# Patient Record
Sex: Female | Born: 1980 | Race: Black or African American | Hispanic: No | Marital: Single | Smoking: Never smoker
Health system: Southern US, Community
[De-identification: ages and names within clinical notes are randomized; demographics above are authoritative.]

## PROBLEM LIST (undated history)

## (undated) ENCOUNTER — Inpatient Hospital Stay (HOSPITAL_COMMUNITY): Payer: Self-pay

## (undated) DIAGNOSIS — I10 Essential (primary) hypertension: Secondary | ICD-10-CM

## (undated) DIAGNOSIS — I82409 Acute embolism and thrombosis of unspecified deep veins of unspecified lower extremity: Secondary | ICD-10-CM

## (undated) DIAGNOSIS — K219 Gastro-esophageal reflux disease without esophagitis: Secondary | ICD-10-CM

## (undated) DIAGNOSIS — D219 Benign neoplasm of connective and other soft tissue, unspecified: Secondary | ICD-10-CM

## (undated) DIAGNOSIS — I2699 Other pulmonary embolism without acute cor pulmonale: Secondary | ICD-10-CM

## (undated) DIAGNOSIS — D649 Anemia, unspecified: Secondary | ICD-10-CM

## (undated) HISTORY — DX: Other pulmonary embolism without acute cor pulmonale: I26.99

## (undated) HISTORY — PX: TONSILLECTOMY: SUR1361

## (undated) HISTORY — DX: Acute embolism and thrombosis of unspecified deep veins of unspecified lower extremity: I82.409

## (undated) HISTORY — DX: Essential (primary) hypertension: I10

---

## 2008-02-29 HISTORY — PX: LAPAROSCOPIC CHOLECYSTECTOMY: SUR755

## 2009-11-03 ENCOUNTER — Encounter: Admission: RE | Admit: 2009-11-03 | Discharge: 2009-11-03 | Payer: Self-pay | Admitting: Obstetrics

## 2010-06-10 ENCOUNTER — Emergency Department (HOSPITAL_COMMUNITY): Admission: EM | Admit: 2010-06-10 | Payer: Self-pay | Source: Home / Self Care

## 2010-11-09 ENCOUNTER — Emergency Department (HOSPITAL_COMMUNITY)
Admission: EM | Admit: 2010-11-09 | Discharge: 2010-11-09 | Disposition: A | Discharge status: Home / Self Care | Payer: Self-pay | Attending: Emergency Medicine | Admitting: Emergency Medicine

## 2010-11-09 ENCOUNTER — Emergency Department (HOSPITAL_COMMUNITY): Payer: Self-pay

## 2010-11-09 DIAGNOSIS — R059 Cough, unspecified: Secondary | ICD-10-CM | POA: Insufficient documentation

## 2010-11-09 DIAGNOSIS — R51 Headache: Secondary | ICD-10-CM | POA: Insufficient documentation

## 2010-11-09 DIAGNOSIS — J40 Bronchitis, not specified as acute or chronic: Secondary | ICD-10-CM | POA: Insufficient documentation

## 2010-11-09 DIAGNOSIS — R0989 Other specified symptoms and signs involving the circulatory and respiratory systems: Secondary | ICD-10-CM | POA: Insufficient documentation

## 2010-11-09 DIAGNOSIS — R05 Cough: Secondary | ICD-10-CM | POA: Insufficient documentation

## 2010-11-09 DIAGNOSIS — R0609 Other forms of dyspnea: Secondary | ICD-10-CM | POA: Insufficient documentation

## 2010-11-09 DIAGNOSIS — R0789 Other chest pain: Secondary | ICD-10-CM | POA: Insufficient documentation

## 2012-02-25 ENCOUNTER — Emergency Department (HOSPITAL_COMMUNITY)
Admission: EM | Admit: 2012-02-25 | Discharge: 2012-02-25 | Disposition: A | Discharge status: Home / Self Care | Payer: Medicaid Other | Attending: Emergency Medicine | Admitting: Emergency Medicine

## 2012-02-25 ENCOUNTER — Emergency Department (HOSPITAL_COMMUNITY): Payer: Medicaid Other

## 2012-02-25 ENCOUNTER — Encounter (HOSPITAL_COMMUNITY): Payer: Self-pay | Admitting: *Deleted

## 2012-02-25 DIAGNOSIS — R5381 Other malaise: Secondary | ICD-10-CM | POA: Insufficient documentation

## 2012-02-25 DIAGNOSIS — J020 Streptococcal pharyngitis: Secondary | ICD-10-CM | POA: Insufficient documentation

## 2012-02-25 DIAGNOSIS — R0602 Shortness of breath: Secondary | ICD-10-CM | POA: Insufficient documentation

## 2012-02-25 DIAGNOSIS — I10 Essential (primary) hypertension: Secondary | ICD-10-CM | POA: Insufficient documentation

## 2012-02-25 LAB — TROPONIN I: Troponin I: <0.3 ng/mL (ref <0.30)

## 2012-02-25 LAB — CBC
MCV: 65 fL — ABNORMAL LOW (ref 78.0–100.0)
Platelets: 245 10*3/uL (ref 150–400)
RBC: 4.94 MIL/uL (ref 3.87–5.11)
RDW: 14.1 % (ref 11.5–15.5)
WBC: 10.1 10*3/uL (ref 4.0–10.5)

## 2012-02-25 MED ORDER — FERROUS SULFATE 325 (65 FE) MG PO TABS: 325.0000 mg | ORAL_TABLET | Freq: Every day | ORAL | Status: DC

## 2012-02-25 MED ORDER — AMOXICILLIN 500 MG PO CAPS: 500.0000 mg | ORAL_CAPSULE | Freq: Three times a day (TID) | ORAL | Status: DC

## 2012-02-25 NOTE — ED Notes (Signed)
Multiple complaints. Reports having sore throat with possible fever last night. Airway intact at triage. Having generalized fatigue and sob with exertion. Had iud removed on 12/19 which was followed by heavy menstrual cycle with large blood clots. No acute distress noted at this time.

## 2012-02-25 NOTE — ED Notes (Signed)
Patient C/o sore throat today.  C/O fatigue. States that her children are being treated for strep throat.  States that she had her IUD removed and has had some bleeding and passing clots.

## 2012-02-25 NOTE — ED Provider Notes (Signed)
History     CSN: [id number]  Arrival date & time 02/25/12  4098   First MD Initiated Contact with Patient 02/25/12 2020      Chief Complaint  Patient presents with  . Sore Throat  . Fatigue    (Consider location/radiation/quality/duration/timing/severity/associated sxs/prior treatment) Patient is a 31 y.o. female presenting with pharyngitis and shortness of breath. The history is provided by the patient. No language interpreter was used.  Sore Throat This is a new problem. The current episode started in the past 7 days. The problem occurs intermittently. The problem has been gradually worsening. Associated symptoms include fatigue, a sore throat, swollen glands and weakness. Pertinent negatives include no rash. The symptoms are aggravated by swallowing. She has tried nothing for the symptoms.  Shortness of Breath  The current episode started more than 1 week ago. The problem occurs occasionally. The problem is mild. The symptoms are relieved by rest. The symptoms are aggravated by activity. Associated symptoms include sore throat and shortness of breath. She was not exposed to toxic fumes. She has not inhaled smoke recently. She has had no prior hospitalizations. She has had no prior ICU admissions. She has had no prior intubations. There were sick contacts at home.  Her two children are currently being treated for strep.  Patient reports heavy vaginal bleeding after removal of IUD on 12/19.  Shortness of breath symptoms started shortly thereafter.  Denies chest pain, reports occasional "tightness" in chest with worsening shortness of breath with exertion.  Past Medical History  Diagnosis Date  . Hypertension     History reviewed. No pertinent past surgical history.  History reviewed. No pertinent family history.  History  Substance Use Topics  . Smoking status: Not on file  . Smokeless tobacco: Not on file  . Alcohol Use: No     Comment: occ    OB History    Grav Para  Term Preterm Abortions TAB SAB Ect Mult Living                  Review of Systems  Constitutional: Positive for fatigue.  HENT: Positive for sore throat.   Respiratory: Positive for shortness of breath.   Skin: Negative for rash.  Neurological: Positive for weakness.  All other systems reviewed and are negative.    Allergies  Review of patient's allergies indicates no known allergies.  Home Medications  No current outpatient prescriptions on file.  BP 148/88  Pulse 103  Temp 98.3 F (36.8 C) (Oral)  Resp 16  SpO2 100%  LMP 02/18/2012  Physical Exam  Vitals reviewed. Constitutional: She is oriented to person, place, and time. She appears well-developed and well-nourished.  HENT:  Head: Normocephalic.  Mouth/Throat: Oropharyngeal exudate present.    Eyes: Conjunctivae normal are normal. Pupils are equal, round, and reactive to light.  Neck: Normal range of motion.  Cardiovascular: Normal rate and regular rhythm.   Pulmonary/Chest: Effort normal and breath sounds normal.  Abdominal: Soft. Bowel sounds are normal.  Musculoskeletal: Normal range of motion.  Lymphadenopathy:    She has cervical adenopathy.  Neurological: She is alert and oriented to person, place, and time.  Skin: Skin is warm and dry. No rash noted. No erythema.  Psychiatric: She has a normal mood and affect. Her behavior is normal. Judgment and thought content normal.    ED Course  Procedures (including critical care time)    Labs Reviewed  CBC - Abnormal; Notable for the following:    Hemoglobin 10.6 (*)  HCT 32.1 (*)     MCV 65.0 (*)     MCH 21.5 (*)     All other components within normal limits  RAPID STREP SCREEN - Abnormal; Notable for the following:    Streptococcus, Group A Screen (Direct) POSITIVE (*)     All other components within normal limits  TROPONIN I   No results found.  Date: 02/25/2012  Rate: 95  Rhythm: normal sinus rhythm  QRS Axis: normal  Intervals: normal   ST/T Wave abnormalities: nonspecific T wave changes  Conduction Disutrbances:none  Narrative Interpretation:  NSR  Old EKG Reviewed: none available   No diagnosis found.  Discussed patient with Dr. Ranae Palms.  CXR normal.  Troponin normal.  EKG without ischemic changes.  Suspect dyspnea may be due to mild anemia secondary to heavy vaginal bleeding.  Patient reports that bleeding has started to resolve over the last 24 hours.  Patient to follow-up with Dr. Gaynell Face if bleeding persists.  Amoxicillin initiated for strep.  Patient to follow-up with Dr. Concepcion Elk.  Return precautions discussed with patient for persistent dyspnea or development of chest pain.  MDM          Jimmye Norman, NP 02/25/12 4259  Jimmye Norman, NP 02/25/12 [phone]

## 2012-03-15 NOTE — ED Provider Notes (Signed)
Medical screening examination/treatment/procedure(s) were performed by non-physician practitioner and as supervising physician I was immediately available for consultation/collaboration.   Shailee Foots, MD 03/15/12 0830 

## 2012-05-29 ENCOUNTER — Emergency Department (HOSPITAL_COMMUNITY)
Admission: EM | Admit: 2012-05-29 | Discharge: 2012-05-29 | Disposition: A | Discharge status: Home / Self Care | Payer: Medicaid Other | Attending: Emergency Medicine | Admitting: Emergency Medicine

## 2012-05-29 ENCOUNTER — Encounter (HOSPITAL_COMMUNITY): Payer: Self-pay

## 2012-05-29 DIAGNOSIS — M25511 Pain in right shoulder: Secondary | ICD-10-CM

## 2012-05-29 DIAGNOSIS — Y92410 Unspecified street and highway as the place of occurrence of the external cause: Secondary | ICD-10-CM | POA: Insufficient documentation

## 2012-05-29 DIAGNOSIS — Y9389 Activity, other specified: Secondary | ICD-10-CM | POA: Insufficient documentation

## 2012-05-29 DIAGNOSIS — IMO0002 Reserved for concepts with insufficient information to code with codable children: Secondary | ICD-10-CM | POA: Insufficient documentation

## 2012-05-29 DIAGNOSIS — L729 Follicular cyst of the skin and subcutaneous tissue, unspecified: Secondary | ICD-10-CM

## 2012-05-29 DIAGNOSIS — I10 Essential (primary) hypertension: Secondary | ICD-10-CM | POA: Insufficient documentation

## 2012-05-29 DIAGNOSIS — L988 Other specified disorders of the skin and subcutaneous tissue: Secondary | ICD-10-CM | POA: Insufficient documentation

## 2012-05-29 DIAGNOSIS — Z4800 Encounter for change or removal of nonsurgical wound dressing: Secondary | ICD-10-CM | POA: Insufficient documentation

## 2012-05-29 MED ORDER — METHOCARBAMOL 500 MG PO TABS: 500.0000 mg | ORAL_TABLET | Freq: Two times a day (BID) | ORAL | Status: DC

## 2012-05-29 MED ORDER — HYDROCODONE-ACETAMINOPHEN 5-325 MG PO TABS: 1.0000 | ORAL_TABLET | ORAL | Status: DC | PRN

## 2012-05-29 MED ORDER — DOXYCYCLINE HYCLATE 100 MG PO CAPS: 100.0000 mg | ORAL_CAPSULE | Freq: Two times a day (BID) | ORAL | Status: DC

## 2012-05-29 MED ORDER — FLUCONAZOLE 150 MG PO TABS: 150.0000 mg | ORAL_TABLET | Freq: Once | ORAL | Status: DC

## 2012-05-29 MED ORDER — IBUPROFEN 800 MG PO TABS: 800.0000 mg | ORAL_TABLET | Freq: Three times a day (TID) | ORAL | Status: DC
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
## 2012-05-31 ENCOUNTER — Emergency Department (HOSPITAL_COMMUNITY)
Admission: EM | Admit: 2012-05-31 | Discharge: 2012-05-31 | Disposition: A | Discharge status: Home / Self Care | Payer: Medicaid Other | Attending: Emergency Medicine | Admitting: Emergency Medicine

## 2012-05-31 ENCOUNTER — Encounter (HOSPITAL_COMMUNITY): Payer: Self-pay

## 2012-05-31 DIAGNOSIS — Y929 Unspecified place or not applicable: Secondary | ICD-10-CM | POA: Insufficient documentation

## 2012-05-31 DIAGNOSIS — S139XXA Sprain of joints and ligaments of unspecified parts of neck, initial encounter: Secondary | ICD-10-CM | POA: Insufficient documentation

## 2012-05-31 DIAGNOSIS — R51 Headache: Secondary | ICD-10-CM | POA: Insufficient documentation

## 2012-05-31 DIAGNOSIS — Z79899 Other long term (current) drug therapy: Secondary | ICD-10-CM | POA: Insufficient documentation

## 2012-05-31 DIAGNOSIS — I10 Essential (primary) hypertension: Secondary | ICD-10-CM | POA: Insufficient documentation

## 2012-05-31 DIAGNOSIS — IMO0002 Reserved for concepts with insufficient information to code with codable children: Secondary | ICD-10-CM | POA: Insufficient documentation

## 2012-05-31 DIAGNOSIS — H539 Unspecified visual disturbance: Secondary | ICD-10-CM | POA: Insufficient documentation

## 2012-05-31 DIAGNOSIS — Y939 Activity, unspecified: Secondary | ICD-10-CM | POA: Insufficient documentation

## 2012-05-31 MED ORDER — IBUPROFEN 800 MG PO TABS
800.0000 mg | ORAL_TABLET | Freq: Once | ORAL | Status: AC
  Administered 2012-05-31: 800 mg via ORAL
  Filled 2012-05-31: qty 1

## 2012-05-31 MED ORDER — NOREPINEPHRINE BITARTRATE 1 MG/ML IJ SOLN
2.0000 ug/min | Freq: Once | INTRAVENOUS | Status: DC
  Filled 2012-05-31: qty 4

## 2012-05-31 NOTE — ED Provider Notes (Signed)
Medical screening examination/treatment/procedure(s) were performed by non-physician practitioner and as supervising physician I was immediately available for consultation/collaboration.  Nizhoni Parlow T Emalyn Schou, MD 05/31/12 1143 

## 2012-05-31 NOTE — ED Notes (Signed)
Pt states that she has HTN and takes HTZ and took around 0700 today. "I have seen a  Few spots here and there but nothing blurry to where I cant see." She is stating she has light sensitivity. Pt has a HA at the forehead and temple. Denies numbness and tingling anywhere.
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
## 2012-06-01 NOTE — ED Provider Notes (Signed)
Medical screening examination/treatment/procedure(s) were performed by non-physician practitioner and as supervising physician I was immediately available for consultation/collaboration.  Nishawn Rotan, MD 06/01/12 0700 

## 2012-08-11 ENCOUNTER — Encounter (HOSPITAL_COMMUNITY): Payer: Self-pay | Admitting: *Deleted

## 2012-08-11 DIAGNOSIS — M549 Dorsalgia, unspecified: Secondary | ICD-10-CM | POA: Insufficient documentation

## 2012-08-11 DIAGNOSIS — I10 Essential (primary) hypertension: Secondary | ICD-10-CM | POA: Insufficient documentation

## 2012-08-11 DIAGNOSIS — Z3201 Encounter for pregnancy test, result positive: Secondary | ICD-10-CM | POA: Insufficient documentation

## 2012-08-11 DIAGNOSIS — R112 Nausea with vomiting, unspecified: Secondary | ICD-10-CM | POA: Insufficient documentation

## 2012-08-11 DIAGNOSIS — R197 Diarrhea, unspecified: Secondary | ICD-10-CM | POA: Insufficient documentation

## 2012-08-11 DIAGNOSIS — Z331 Pregnant state, incidental: Secondary | ICD-10-CM | POA: Insufficient documentation

## 2012-08-11 LAB — URINALYSIS, ROUTINE W REFLEX MICROSCOPIC
Bilirubin Urine: NEGATIVE
Glucose, UA: NEGATIVE mg/dL
Hgb urine dipstick: NEGATIVE
Protein, ur: NEGATIVE mg/dL

## 2012-08-11 LAB — CBC WITH DIFFERENTIAL/PLATELET
Basophils Absolute: 0.1 10*3/uL (ref 0.0–0.1)
Eosinophils Relative: 2 % (ref 0–5)
Lymphocytes Relative: 43 % (ref 12–46)
MCV: 64.7 fL — ABNORMAL LOW (ref 78.0–100.0)
Monocytes Relative: 9 % (ref 3–12)
Neutrophils Relative %: 45 % (ref 43–77)
Platelets: 273 10*3/uL (ref 150–400)
RBC: 5.15 MIL/uL — ABNORMAL HIGH (ref 3.87–5.11)
RDW: 14.8 % (ref 11.5–15.5)
WBC: 8.8 10*3/uL (ref 4.0–10.5)

## 2012-08-11 LAB — COMPREHENSIVE METABOLIC PANEL
ALT: 15 U/L (ref 0–35)
AST: 18 U/L (ref 0–37)
Albumin: 4 g/dL (ref 3.5–5.2)
Alkaline Phosphatase: 60 U/L (ref 39–117)
CO2: 26 mEq/L (ref 19–32)
Chloride: 102 mEq/L (ref 96–112)
Creatinine, Ser: 0.88 mg/dL (ref 0.50–1.10)
GFR calc non Af Amer: 85 mL/min — ABNORMAL LOW (ref >90)
Potassium: 3.2 mEq/L — ABNORMAL LOW (ref 3.5–5.1)
Sodium: 136 mEq/L (ref 135–145)
Total Bilirubin: 0.3 mg/dL (ref 0.3–1.2)

## 2012-08-11 LAB — URINE MICROSCOPIC-ADD ON

## 2012-08-11 LAB — POCT PREGNANCY, URINE: Preg Test, Ur: POSITIVE — AB

## 2012-08-11 MED ORDER — ONDANSETRON 4 MG PO TBDP: 8.0000 mg | ORAL_TABLET | Freq: Once | ORAL | Status: DC

## 2012-08-11 NOTE — ED Notes (Signed)
Upper neck and radiating down to lower back, and now has resolved. N/v/d that started this morning and resolved ~1330.

## 2012-08-12 ENCOUNTER — Emergency Department (HOSPITAL_COMMUNITY)
Admission: EM | Admit: 2012-08-12 | Discharge: 2012-08-12 | Disposition: A | Discharge status: Home / Self Care | Payer: Medicaid Other | Attending: Emergency Medicine | Admitting: Emergency Medicine

## 2012-08-12 DIAGNOSIS — R110 Nausea: Secondary | ICD-10-CM

## 2012-08-12 DIAGNOSIS — Z331 Pregnant state, incidental: Secondary | ICD-10-CM

## 2012-08-12 DIAGNOSIS — M549 Dorsalgia, unspecified: Secondary | ICD-10-CM

## 2012-08-12 DIAGNOSIS — R197 Diarrhea, unspecified: Secondary | ICD-10-CM

## 2012-08-12 MED ORDER — ONDANSETRON 8 MG PO TBDP: 8.0000 mg | ORAL_TABLET | Freq: Once | ORAL | Status: DC

## 2012-08-12 NOTE — ED Provider Notes (Signed)
History     CSN: [id number]  Arrival date & time 08/11/12  2055   First MD Initiated Contact with Patient 08/12/12 0035      Chief Complaint  Patient presents with  . Back Pain  . Emesis    (Consider location/radiation/quality/duration/timing/severity/associated sxs/prior treatment) HPI 32 yo female presents to the ER with several complaints.  She reports upon waking yesterday she had diffuse pain to her entire back.  She denies any new activities or injury to the back.  Pain ran up and down her spine, worse with movement.  This resolved over time, without intervention.  Today she has had nausea without vomiting, and has had several loose stools/diarrhea.  Some abdominal cramping.  No fevers.  No sick contacts, no unusual foods, no travel.  Nausea and diarrhea have now resolved as well.  Pt currently asymptomatic, but wanted to get checked out to make sure nothing serious was going on.  Past Medical History  Diagnosis Date  . Hypertension     Past Surgical History  Procedure Laterality Date  . Cholecystectomy      No family history on file.  History  Substance Use Topics  . Smoking status: Never Smoker   . Smokeless tobacco: Not on file  . Alcohol Use: Yes     Comment: occ    OB History   Grav Para Term Preterm Abortions TAB SAB Ect Mult Living                  Review of Systems  All other systems reviewed and are negative.    Allergies  Review of patient's allergies indicates no known allergies.  Home Medications   Current Outpatient Rx  Name  Route  Sig  Dispense  Refill  . hydrochlorothiazide (HYDRODIURIL) 25 MG tablet   Oral   Take 25 mg by mouth daily.         . ondansetron (ZOFRAN-ODT) 8 MG disintegrating tablet   Oral   Take 1 tablet (8 mg total) by mouth once.   20 tablet   0     BP 136/84  Pulse 78  Temp(Src) 98.7 F (37.1 C) (Oral)  Resp 17  SpO2 99%  LMP 07/01/2012  Physical Exam  Nursing note and vitals  reviewed. Constitutional: She is oriented to person, place, and time. She appears well-developed and well-nourished.  HENT:  Head: Normocephalic and atraumatic.  Right Ear: External ear normal.  Left Ear: External ear normal.  Nose: Nose normal.  Mouth/Throat: Oropharynx is clear and moist.  Eyes: Conjunctivae and EOM are normal. Pupils are equal, round, and reactive to light.  Neck: Normal range of motion. Neck supple. No JVD present. No tracheal deviation present. No thyromegaly present.  Cardiovascular: Normal rate, regular rhythm, normal heart sounds and intact distal pulses.  Exam reveals no gallop and no friction rub.   No murmur heard. Pulmonary/Chest: Effort normal and breath sounds normal. No stridor. No respiratory distress. She has no wheezes. She has no rales. She exhibits no tenderness.  Abdominal: Soft. Bowel sounds are normal. She exhibits no distension and no mass. There is no tenderness. There is no rebound and no guarding.  Musculoskeletal: Normal range of motion. She exhibits no edema and no tenderness.  Lymphadenopathy:    She has no cervical adenopathy.  Neurological: She is alert and oriented to person, place, and time. She exhibits normal muscle tone. Coordination normal.  Skin: Skin is dry. No rash noted. No erythema. No pallor.  Psychiatric:  She has a normal mood and affect. Her behavior is normal. Judgment and thought content normal.    ED Course  Procedures (including critical care time)  Labs Reviewed  CBC WITH DIFFERENTIAL - Abnormal; Notable for the following:    RBC 5.15 (*)    Hemoglobin 11.2 (*)    HCT 33.3 (*)    MCV 64.7 (*)    MCH 21.7 (*)    All other components within normal limits  COMPREHENSIVE METABOLIC PANEL - Abnormal; Notable for the following:    Potassium 3.2 (*)    Calcium 10.8 (*)    GFR calc non Af Amer 85 (*)    All other components within normal limits  URINALYSIS, ROUTINE W REFLEX MICROSCOPIC - Abnormal; Notable for the  following:    Ketones, ur 15 (*)    Leukocytes, UA SMALL (*)    All other components within normal limits  URINE MICROSCOPIC-ADD ON - Abnormal; Notable for the following:    Squamous Epithelial / LPF MANY (*)    Bacteria, UA MANY (*)    Casts HYALINE CASTS (*)    All other components within normal limits  POCT PREGNANCY, URINE - Abnormal; Notable for the following:    Preg Test, Ur POSITIVE (*)    All other components within normal limits   No results found.   1. Diarrhea   2. Nausea   3. Pregnancy as incidental finding   4. Back pain       MDM  32 year old female with one day of back pain, now resolved, one day of nausea and diarrhea, also, now resolved.  Patient was found to be pregnant incidentally.  Her workup otherwise unremarkable.  She does have a Dr. that she can followup with.        Olivia Mackie, MD 08/13/12 [phone]

## 2012-08-23 ENCOUNTER — Encounter: Payer: Self-pay | Admitting: Obstetrics & Gynecology

## 2012-09-05 LAB — OB RESULTS CONSOLE ABO/RH: RH Type: POSITIVE

## 2012-09-20 ENCOUNTER — Other Ambulatory Visit (HOSPITAL_COMMUNITY): Payer: Self-pay | Admitting: Obstetrics

## 2012-09-20 DIAGNOSIS — O3680X0 Pregnancy with inconclusive fetal viability, not applicable or unspecified: Secondary | ICD-10-CM

## 2012-09-25 ENCOUNTER — Ambulatory Visit (HOSPITAL_COMMUNITY)
Admission: RE | Admit: 2012-09-25 | Discharge: 2012-09-25 | Disposition: A | Discharge status: Home / Self Care | Payer: Medicaid Other | Source: Ambulatory Visit | Attending: Obstetrics | Admitting: Obstetrics

## 2012-09-25 DIAGNOSIS — O368390 Maternal care for abnormalities of the fetal heart rate or rhythm, unspecified trimester, not applicable or unspecified: Secondary | ICD-10-CM | POA: Insufficient documentation

## 2012-09-25 DIAGNOSIS — O3680X0 Pregnancy with inconclusive fetal viability, not applicable or unspecified: Secondary | ICD-10-CM | POA: Insufficient documentation

## 2012-10-17 ENCOUNTER — Ambulatory Visit (HOSPITAL_COMMUNITY)
Admission: RE | Admit: 2012-10-17 | Discharge: 2012-10-17 | Disposition: A | Discharge status: Home / Self Care | Payer: Medicaid Other | Source: Ambulatory Visit | Attending: Obstetrics | Admitting: Obstetrics

## 2012-10-17 ENCOUNTER — Other Ambulatory Visit (HOSPITAL_COMMUNITY): Payer: Self-pay | Admitting: Obstetrics

## 2012-10-17 DIAGNOSIS — O3680X0 Pregnancy with inconclusive fetal viability, not applicable or unspecified: Secondary | ICD-10-CM | POA: Insufficient documentation

## 2012-10-17 DIAGNOSIS — IMO0002 Reserved for concepts with insufficient information to code with codable children: Secondary | ICD-10-CM

## 2012-10-17 DIAGNOSIS — O368390 Maternal care for abnormalities of the fetal heart rate or rhythm, unspecified trimester, not applicable or unspecified: Secondary | ICD-10-CM | POA: Insufficient documentation

## 2012-10-24 ENCOUNTER — Ambulatory Visit (INDEPENDENT_AMBULATORY_CARE_PROVIDER_SITE_OTHER): Payer: Medicaid Other | Admitting: Surgery

## 2012-10-24 ENCOUNTER — Encounter (INDEPENDENT_AMBULATORY_CARE_PROVIDER_SITE_OTHER): Payer: Self-pay | Admitting: Surgery

## 2012-10-24 VITALS — BP 126/80 | HR 78 | Temp 97.1°F | Resp 16 | Ht 64.0 in | Wt 197.2 lb

## 2012-10-24 DIAGNOSIS — R1900 Intra-abdominal and pelvic swelling, mass and lump, unspecified site: Secondary | ICD-10-CM

## 2012-10-24 DIAGNOSIS — E669 Obesity, unspecified: Secondary | ICD-10-CM

## 2012-10-24 NOTE — Patient Instructions (Addendum)
Obtain MRI of pelvis to define what this mass is.  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  Call us once to have that done.  At some point, this will require resection.  This likely will need to be at a major academic center where abdominal and perineal/buttock approaches need to occur.  Sometimes part of the sacrum (tailbone) requires resection to get to this mass.  Ideally, would wait until after pregnancy to address this.  I would like to avoid aspiration as that could risk the cystic for infection and therefore peritonitis and therefore loss of pregnancy.

## 2012-10-24 NOTE — Progress Notes (Signed)
Subjective:     Patient ID: Brittney Sandoval, female   DOB: 1980/08/02, 32 y.o.   MRN: [id number]  HPI  CARROL HOUGLAND  Sep 15, 1980 [id number]  Patient Care Team: Altamese Eufaula, MD as PCP - General (Family Medicine) Kathreen Cosier, MD as Consulting Physician (Obstetrics and Gynecology)  This patient is a 32 y.o.female who presents today for surgical evaluation at the request of Dr. Gaynell Face.   Reason for visit: Perirectal cyst.  Request drainage  Pleasant obese female that is struggled with a cyst on her buttocks.  It is intermittently swollen.  Usually with pregnancies.  All been vaginal deliveries.    Usually has not aspirated with a needle.  Never had it excised.  It is more swollen now on her third pregnancy.  She is currently in her first trimester.  His the largest it has ever been.  He is getting uncomfortable to sit on.  No fevers or chills.  Does have constipation with bowel movements 2-3 times a week.  No rectal pain.  No vaginal bleeding or discharge.  No dysuria or hematuria.  She had a laparoscopic cholecystectomy but no other abdominal surgeries.  Patient Active Problem List   Diagnosis Date Noted  . 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 Obesity (BMI 30-39.9) 10/24/2012    Past Medical History  Diagnosis Date  . Hypertension     Past Surgical History  Procedure Laterality Date  . Cholecystectomy      History   Social History  . Marital Status: Single    Spouse Name: N/A    Number of Children: N/A  . Years of Education: N/A   Occupational History  . Not on file.   Social History Main Topics  . Smoking status: Never Smoker   . Smokeless tobacco: Not on file  . Alcohol Use: Yes     Comment: occ  . Drug Use: No  . Sexual Activity: Yes    Birth Control/ Protection: None   Other Topics Concern  . Not on file   Social History Narrative  . No narrative on file    History reviewed.  No pertinent family history.  Current Outpatient Prescriptions  Medication Sig Dispense Refill  . Prenatal Vit-Fe Sulfate-FA (PRENATAL VITAMIN PO) Take by mouth.       No current facility-administered medications for this visit.     No Known Allergies  BP 126/80  Pulse 78  Temp(Src) 97.1 F (36.2 C) (Temporal)  Resp 16  Ht 5\' 4"  (1.626 m)  Wt 197 lb 3.2 oz (89.449 kg)  BMI 33.83 kg/m2   OBSTETRICS REPORT (Signed Final 10/17/2012 04:22 pm) ---------------------------------------------------------------------- Patient Info  ID #: [id number] D.O.B.: [birth year] (31 yrs) Name: Brittney Sandoval Visit Date: 10/17/2012 04:00 pm ---------------------------------------------------------------------- Performed By  Performed By: Earley Brooke Referred By: Francoise Ceo MD BS, RDMS Attending: Myles Rosenthal MD Location: [facility] ---------------------------------------------------------------------- Service(s) Provided  US OB LIMITED 76815.0 ---------------------------------------------------------------------- Indications  Absent fetal heart tones in office 659.70 Pregnancy with inconclusive fetal viability V23.87 ---------------------------------------------------------------------- Fetal Evaluation  Num Of Fetuses: 1 Fetal Heart Rate: 153 bpm Cardiac Activity: Observed Presentation: Transverse, head to maternal right Placenta: Anterior, above cervical os  Amniotic Fluid AFI FV: Subjectively within normal limits Larg Pckt: 2.9 cm ---------------------------------------------------------------------- Biometry  CRL: 90 mm G. Age: N/A EDD:  BPD: 23.4 mm G. Age: 13w 6d ---------------------------------------------------------------------- Gestational Age  LMP: 15w 3d Date: 07/01/12 EDD: 04/07/13 U/S Today: 13w  6d EDD: 04/18/13 Best: 14w 5d Det. By: U/S C R L (09/25/12) EDD:  04/12/13 ---------------------------------------------------------------------- Cervix Uterus Adnexa  Cervix: Normal appearance by transabdominal scan. Uterus: Single fibroid noted, see table below. Left Ovary: Size(cm) L: 3.96 x W: 3.16 x H: 1.92 Volume(cc): 12.6 Right Ovary: Size(cm) L: 3.14 x W: 2.73 x H: 2.24 Volume(cc): 10.1 Adnexa: No abnormality visualized. Comment: Cystic mass again in cul de sac, which has tubular shape and complex fluid. This measures 17.5 x 11.4 x 8.4cm today. ---------------------------------------------------------------------- Myomas  Site L(cm) W(cm) D(cm) Location Posterior 2.6 2.6 2.8 ----------------------------------------------------------------------  Blood Flow RI PI Comments  ---------------------------------------------------------------------- Impression  Single living intrauterine fetus. Small posterior fibroid. Large tubular cystic lesion in cul de sac measuring 17.5 cm, with features highly suggestive of hydrosalpins or hematosalpinx, or possibly a peritoneal inclusion cyst. Consider pelvic MRI for further characterization. ----------------------------------------------------------------------  Thank you for sharing in the care of Ms. Ledora A Feick with [place]. Please do not hesitate to contact us if you have any questions or concerns. Myles Rosenthal, MD Electronically Signed Final Report 10/17/2012 04:22 pm   Embedded Images (not for diagnostic purposes)     Signs and Symptoms:    History:  FETAL HEART TONES NOT HEARD  Comments:    Visit Pt Loc:  [facility] Phone:     Performing Provider Comments:     Phone Number: Beeper: EMail: " noWrap align=leftAttending:  Phone Number: Beeper: EMail: " align=leftMARSHALL, BERNARD A   Phone:    Beeper:    Email:     Phone Number: Beeper: EMail: " noWrap align=leftRequester:  Phone Number: Beeper: EMail: " align=leftMARSHALL, BERNARD A      Review of Systems  Constitutional:  Negative for fever, chills, diaphoresis, appetite change and fatigue.  HENT: Negative for ear pain, sore throat, trouble swallowing, neck pain and ear discharge.   Eyes: Negative for photophobia, discharge and visual disturbance.  Respiratory: Negative for cough, choking, chest tightness and shortness of breath.   Cardiovascular: Negative for chest pain and palpitations.  Gastrointestinal: Positive for constipation. Negative for nausea, vomiting, abdominal pain, diarrhea, anal bleeding and rectal pain.  Endocrine: Negative for cold intolerance and heat intolerance.  Genitourinary: Positive for pelvic pain. Negative for dysuria, urgency, frequency, hematuria, decreased urine volume, vaginal bleeding, vaginal discharge, enuresis, difficulty urinating and vaginal pain.  Musculoskeletal: Negative for myalgias and gait problem.  Skin: Negative for color change, pallor and rash.  Allergic/Immunologic: Negative for environmental allergies, food allergies and immunocompromised state.  Neurological: Negative for dizziness, speech difficulty, weakness and numbness.  Hematological: Negative for adenopathy.  Psychiatric/Behavioral: Negative for confusion and agitation. The patient is not nervous/anxious.        Objective:   Physical Exam  Constitutional: She is oriented to person, place, and time. She appears well-developed and well-nourished. No distress.  HENT:  Head: Normocephalic.  Mouth/Throat: Oropharynx is clear and moist. No oropharyngeal exudate.  Eyes: Conjunctivae and EOM are normal. Pupils are equal, round, and reactive to light. No scleral icterus.  Neck: Normal range of motion. Neck supple. No tracheal deviation present.  Cardiovascular: Normal rate, regular rhythm and intact distal pulses.   Pulmonary/Chest: Effort normal and breath sounds normal. No stridor. No respiratory distress. She exhibits no tenderness.  Abdominal: Soft. She exhibits no distension and no mass. There is no  tenderness. No hernia. Hernia confirmed negative in the right inguinal area and confirmed negative in the left inguinal area.    Genitourinary:    No vaginal discharge found.  Musculoskeletal: Normal range of motion. She exhibits no tenderness.       Right elbow: She exhibits normal range of motion.       Left elbow: She exhibits normal range of motion.       Right wrist: She exhibits normal range of motion.       Left wrist: She exhibits normal range of motion.       Right hand: Normal strength noted.       Left hand: Normal strength noted.  Lymphadenopathy:       Head (right side): No posterior auricular adenopathy present.       Head (left side): No posterior auricular adenopathy present.    She has no cervical adenopathy.    She has no axillary adenopathy.       Right: No inguinal adenopathy present.       Left: No inguinal adenopathy present.  Neurological: She is alert and oriented to person, place, and time. No cranial nerve deficit. She exhibits normal muscle tone. Coordination normal.  Skin: Skin is warm and dry. No rash noted. She is not diaphoretic. No erythema.  Psychiatric: She has a normal mood and affect. Her behavior is normal. Judgment and thought content normal.       Assessment:     Large ellipsoid mass in right buttock.  Ultrasound implies contiguous with large pelvic fluid mass.  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  I strongly recommend avoiding this during her pregnancy if possible.  I discussed with my partner in colorectal surgery, Dr. Romie Levee.  Suspect this would require complex removal through transabdominal and perineal approach.  Possible partial resection of sacrum for transsacral approach.  Given how rare these are, would be best served at a major academic center in performing this.  Would like to get a pelvic MRI to characterize this mass to make sure there are no  surprises.   I do not like the idea of just doing needle aspiration either.  Back.  Risk of infection of the cyst.  Infected cyst could lead to peritonitis and fetal loss.  Also, the large size makes long-term resolution of this poor.  She was hoping to have it aspirated, but she agrees with getting more information first.  She is worried that the cystic mass will complicate the health of her pregnancy, Such as the length was hard to get fetal heart tones with the cystic mass interfering with office ultrasound/duplex.  Given the fact that FHT could be found in radiology will be ultrasound, it gives me hope it will be less complicated especially as the uterus gets larger and out of the pelvis.  I doubt it will threaten the pregnancy, but, but would like to get more information first and then regroup.  She will call as well as the MRI is done.  Hopefully that will be soon.

## 2012-10-31 ENCOUNTER — Encounter (HOSPITAL_COMMUNITY): Payer: Self-pay

## 2012-10-31 ENCOUNTER — Other Ambulatory Visit (INDEPENDENT_AMBULATORY_CARE_PROVIDER_SITE_OTHER): Payer: Self-pay | Admitting: Surgery

## 2012-10-31 ENCOUNTER — Ambulatory Visit (HOSPITAL_COMMUNITY)
Admission: RE | Admit: 2012-10-31 | Discharge: 2012-10-31 | Disposition: A | Discharge status: Home / Self Care | Payer: Medicaid Other | Source: Ambulatory Visit | Attending: Surgery | Admitting: Surgery

## 2012-10-31 DIAGNOSIS — O99891 Other specified diseases and conditions complicating pregnancy: Secondary | ICD-10-CM | POA: Insufficient documentation

## 2012-10-31 DIAGNOSIS — R1900 Intra-abdominal and pelvic swelling, mass and lump, unspecified site: Secondary | ICD-10-CM

## 2012-10-31 DIAGNOSIS — R1909 Other intra-abdominal and pelvic swelling, mass and lump: Secondary | ICD-10-CM | POA: Insufficient documentation

## 2012-11-01 ENCOUNTER — Telehealth (INDEPENDENT_AMBULATORY_CARE_PROVIDER_SITE_OTHER): Payer: Self-pay | Admitting: Surgery

## 2012-11-01 NOTE — Telephone Encounter (Signed)
Requested by the patient's insurance company for preauthorization on request for a pelvic MRI. I discussed it with them 10/26/2012. Given the fact that she is in the first trimester of pregnancy and has a complex 17cm cystic mass in the pelvis coming out of the perineum, I felt it was justified. CT scan on option with her pregnancy. Also feel an MRI would be better to evaluate this soft tissue mass. Recommended by radiology as ultrasound limited on evaluating the entire region. I d/w the insurance MD whom agreed with the approriateness of the order. He noted that preauthorization should come through the fax later today.  [phone]  Ref # [id number].  Insurance pt ID# [id number]

## 2012-11-05 ENCOUNTER — Telehealth (INDEPENDENT_AMBULATORY_CARE_PROVIDER_SITE_OTHER): Payer: Self-pay

## 2012-11-05 NOTE — Telephone Encounter (Signed)
Pt returned my call. I advised her about the MRI report showing the giant cyst is starting at the hip joint coming up thru the pelvic floor and going thru her buttocks per Dr Michaell Cowing. Dr Michaell Cowing spoke to Dr Gaynell Face about recommendation of the pt being referred to a major academic center like [facility] and Dr Gaynell Face said ok. The pt advised me that she has switched her OB/GYN care to [facility] to Dr Osborn Coho. The pt requested we send her info to their office. I will fax her records today to [phone]. The pt is asking about just at least getting the area drained till she has the baby b/c of discomfort and I explained to her that Dr Michaell Cowing does not want to do anything with this cyst per Dr Michaell Cowing. Dr Michaell Cowing advises for pt to be seen at [facility] to get their recommendations. The pt understands.

## 2012-11-05 NOTE — Telephone Encounter (Signed)
LMOM for pt to call me back so I can discus her MRI results. Dr Michaell Cowing advised the MRI to show a giant cyst that starts at the hip bone coming up thru the pelvic floor and going out thru her buttocks. Dr Michaell Cowing spoke to Dr Gaynell Face her OB/GYN that the pt would benefit better with this mass requiring removal from a major academic center like [facility] to better serve the pt's care. Dr Gaynell Face took the recommendation.

## 2012-11-06 ENCOUNTER — Other Ambulatory Visit: Payer: Self-pay

## 2012-11-06 LAB — OB RESULTS CONSOLE RPR: RPR: NONREACTIVE

## 2012-11-08 ENCOUNTER — Encounter (HOSPITAL_COMMUNITY): Payer: Self-pay | Admitting: Obstetrics and Gynecology

## 2012-11-13 ENCOUNTER — Encounter (HOSPITAL_COMMUNITY): Payer: Self-pay | Admitting: Obstetrics and Gynecology

## 2012-11-14 ENCOUNTER — Encounter (INDEPENDENT_AMBULATORY_CARE_PROVIDER_SITE_OTHER): Payer: Self-pay

## 2012-11-15 ENCOUNTER — Other Ambulatory Visit: Payer: Self-pay | Admitting: Obstetrics and Gynecology

## 2012-11-15 DIAGNOSIS — Z3689 Encounter for other specified antenatal screening: Secondary | ICD-10-CM

## 2012-11-15 DIAGNOSIS — L0591 Pilonidal cyst without abscess: Secondary | ICD-10-CM

## 2012-11-20 ENCOUNTER — Encounter (HOSPITAL_COMMUNITY): Payer: Self-pay

## 2012-11-20 ENCOUNTER — Ambulatory Visit (HOSPITAL_COMMUNITY)
Admission: RE | Admit: 2012-11-20 | Discharge: 2012-11-20 | Disposition: A | Discharge status: Home / Self Care | Payer: Medicaid Other | Source: Ambulatory Visit | Attending: Obstetrics and Gynecology | Admitting: Obstetrics and Gynecology

## 2012-11-20 ENCOUNTER — Inpatient Hospital Stay (HOSPITAL_COMMUNITY): Admission: RE | Admit: 2012-11-20 | Payer: Medicaid Other | Source: Ambulatory Visit

## 2012-11-20 ENCOUNTER — Other Ambulatory Visit (HOSPITAL_COMMUNITY): Payer: Self-pay | Admitting: Maternal and Fetal Medicine

## 2012-11-20 DIAGNOSIS — R1900 Intra-abdominal and pelvic swelling, mass and lump, unspecified site: Secondary | ICD-10-CM

## 2012-11-20 DIAGNOSIS — O10019 Pre-existing essential hypertension complicating pregnancy, unspecified trimester: Secondary | ICD-10-CM | POA: Insufficient documentation

## 2012-11-20 DIAGNOSIS — Z363 Encounter for antenatal screening for malformations: Secondary | ICD-10-CM | POA: Insufficient documentation

## 2012-11-20 DIAGNOSIS — Z3689 Encounter for other specified antenatal screening: Secondary | ICD-10-CM

## 2012-11-20 DIAGNOSIS — L0591 Pilonidal cyst without abscess: Secondary | ICD-10-CM

## 2012-11-20 DIAGNOSIS — O358XX0 Maternal care for other (suspected) fetal abnormality and damage, not applicable or unspecified: Secondary | ICD-10-CM | POA: Insufficient documentation

## 2012-11-20 DIAGNOSIS — Z1389 Encounter for screening for other disorder: Secondary | ICD-10-CM | POA: Insufficient documentation

## 2012-11-20 DIAGNOSIS — O09299 Supervision of pregnancy with other poor reproductive or obstetric history, unspecified trimester: Secondary | ICD-10-CM | POA: Insufficient documentation

## 2012-11-20 NOTE — Consult Note (Signed)
Maternal Fetal Medicine Consultation  Requesting Provider(s): Osborn Coho, MD  Reason for consultation: Large pilonidal cyst  HPI: Brittney Sandoval is a 32 yo [id number] currently at [redacted]w[redacted]d who was seen today due to a large pilonidal cyst.  Brittney Sandoval had this issue though out the course of her last pregnancies, but the cyst has never been this large or uncomfortable.  She reports that with her last delivery, her Obstetrician "drained the cyst" shortly before labor was induced.  During her last pregnancy, the cyst was aspirated at least on one other occasion due to symptoms.  It has never been addressed outside of pregnancy.  Recent MRI reports: 19.6 x 9.7 x 8.2 cystic lesion that extends from the cul-de-sac through the left levator ani and into the subcutaneous tissues of the left buttock.  The patient was seen by General Surgery Karie Soda, MD) as well as a General surgeon at [facility] - both feel that surgery / exploration should be deferred until after delivery.  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  Additionally, due to risks of infection, they do not feel that needle aspiration is appropriate at this time as well.  The recommendation is that the procedure be performed at a large academic facility after delivery.  Brittney Sandoval reports a history of hypertension.  She was previously on HCTZ but has been off medications since May 2014 and blood pressures have been normal without medications.  OB History: OB History   Grav Para Term Preterm Abortions TAB SAB Ect Mult Living   4 3 3       3     Preeclampsia with first pregnancy  PMH:  Past Medical History  Diagnosis Date  . Hypertension     PSH:  Past Surgical History  Procedure Laterality Date  . [place]aroscopic [place]cyst[place]  [place]   Meds: Prenatal vitamins  Allergies: No  Known Allergies  FH: Denies family history of birth defects or hereditary disorders  Soc: Drank occasionally before pregnancy.  No tobacco or ETOH since becoming pregnant.  Review of Systems: no vaginal bleeding or cramping/contractions, no LOF, no nausea/vomiting. All other systems reviewed and are negative.  PE: Wt: 201#, BP 127/77 Pulse 82  GEN: well-appearing female ABD: gravid, NT  Ultrasound: Single IUP at 18 5/7 weeks Normal anatomic fetal survey; however, limited views of the fetal heart obtained due to fetal position No markers associated with aneuploidy noted Normal amniotic fluid volume  Large pelvic / rectal  cystic structure noted measuring 8.9 x 7.8 x 17.2 cm.  A/P: 1) Single IUP at 20 2/7 weeks         2) Large cystic pelvic / perineal mass - based on the description of the mass and the likelihood that it will require both transabdominal and perineal approach for resection would concur with decision to postpone definitive surgery until after delivery.  The recommendation to avoid needle aspiration due to risks of infection also seem reasonable.  The patient's concern that this large mass could potentially preclude vaginal delivery.  She addressed with issue with the General Surgeon at [facility], and they do not believe that this will be an issue as the mass is cystic and not solid.  They are not concerned that the cyst may rupture at delivery.  Based on this information, I would not recommend a planned cesarean delivery -  but would have a low threshold to move toward cesarean delivery for arrest of descent or other obstetrical indications.  - Recommend follow up ultrasound in 4 weeks to reevaluate the fetal heart.   Thank you for the opportunity to be a part of the care of Brittney Sandoval. Please contact our office if we can be of further assistance.   I spent approximately 30 minutes with this patient with over 50% of time spent in face-to-face  counseling.  Alpha Gula, MD Maternal Fetal Medicine

## 2012-11-27 ENCOUNTER — Encounter: Payer: Self-pay | Admitting: Gynecologic Oncology

## 2012-11-27 ENCOUNTER — Ambulatory Visit: Payer: Medicaid Other | Attending: Gynecologic Oncology | Admitting: Gynecologic Oncology

## 2012-11-27 VITALS — BP 133/79 | HR 86 | Temp 98.4°F | Resp 20 | Ht 64.0 in | Wt 204.4 lb

## 2012-11-27 DIAGNOSIS — R1909 Other intra-abdominal and pelvic swelling, mass and lump: Secondary | ICD-10-CM | POA: Insufficient documentation

## 2012-11-27 DIAGNOSIS — O99891 Other specified diseases and conditions complicating pregnancy: Secondary | ICD-10-CM | POA: Insufficient documentation

## 2012-11-27 DIAGNOSIS — R1900 Intra-abdominal and pelvic swelling, mass and lump, unspecified site: Secondary | ICD-10-CM

## 2012-11-28 ENCOUNTER — Encounter: Payer: Self-pay | Admitting: *Deleted

## 2012-11-29 ENCOUNTER — Encounter: Payer: Self-pay | Admitting: Obstetrics and Gynecology

## 2012-11-29 NOTE — Progress Notes (Signed)
Consult Note: Gyn-Onc  Consult was requested by Dr. Pennie Rushing for the evaluation of Brittney Sandoval 32 y.o. female  CC:  Chief Complaint  Patient presents with  . Pelvic Mass    New Consult    Assessment/Plan:  Brittney. Brittney Sandoval  is a 32 y.o.  year old with a 19 x 6 x 9.7 8.2 cm unilocular cystic lesion extending from the cul-de-sac adjacent to the performance muscle into the left perirectal space is rectal fossa are and within 3 mm of the skin surface of the subcutaneous tissue the differential diagnosis for this mass is that of a peritoneal inclusion cyst lymphangioma or tailgut duplication cyst. The patient denies changes in urinary flow or rectal function however the examination the vagina is displaced towards the right. I am not inclined to believe that a vaginal delivery if possible however resection of this mass during pregnancy may be associated with significant morbidity. The patient decided to change her obstetric care and has a risk pregnancy clinic visit scheduled for 12/03/2012 I. reviewed the images with a general surgeon at [facility] and he recommends referral to [facility] for management after pregnancy and does not believe that drainage of the mass is appropriate.  Have asked Brittney Sandoval to contact us following her high risk obstetric visit so that I can identify a tear or delivery plan and make recommendations for additional followup at [facility] as is indicated.   HPI: Brittney Sandoval is a 32 year old gravida 4 para 3 estimated date of confinement 04/12/2013. She noted the presence of a mass protruding through the left inferior perineum at the age of 69. The mass remains relatively stable in size and at the time of childbirth that was drained and she is able to have a normal spontaneous vaginal delivery. Resection was recommended however because of the dizziness of life function and able to followup. The mass recurred and at the time of her third pregnancy the age of 3 this was again  drained just prior to delivery in [place]. The mass has recurred and per the patient is larger than it's ever been.Her obstetrician for this pregnancy, Dr. Gaynell Face, was reluctant to drain assistant and amended assessment by Dr. gross. Dr. gross his assessment is that this is a pilonidal cyst and he recommends resection of the larger facility after pregnancy due to the complexity of the mass. The patient states that she saw a surgeon Dr. Emogene Morgan at [facility] recommended a vaginal delivery and treatment after pregnancy. Dr. Emogene Morgan is willing to drain the mass as is necessary.  She denies any weight loss any changes in appetite she states that there is normal fetal movement without rupture of membranes and no contractions.  She denies changes in the caliber of her stool denies urinary frequency.  She reports discomfort with sitting but otherwise is sexually active and denies that it causes pain.   MRI Unilocular 19.6 by 9.7 by 8.2 cm cystic lesion extends from the cul-de-sac posteriorly, adjacent to the piriformis muscle and into the left perirectal space, ischiorectal fossa, and subcutaneous tissues. This traverses the left levator ani muscle, with a significant posterior portion of the mass along the left medial margin of the intergluteal fold only 3 mm deep to the skin surface posterior to the anus. Suspected small intramural fibroids in the lower uterus. Cervical length 5.4 cm. The very bottom margin of the fetus is noted. The urinary bladder is positioned anteriorly and flattened. There is somewhat acute angulation of the  sacrococcygeal junction as shown on image 24 of series 8. This appears chronic. Distal tip of the coccyx is oriented anteriorly along the posterior margin of the fluid collection on image 24 of series 8. No bony erosion observed.  Top differential diagnostic considerations include peritoneal inclusion cyst, lymphangioma, or tailgut duplication cyst.  Current Meds:  Outpatient  Encounter Prescriptions as of 11/27/2012  Medication Sig Dispense Refill  . Prenatal Vit-Fe Sulfate-FA (PRENATAL VITAMIN PO) Take by mouth.       No facility-administered encounter medications on file as of 11/27/2012.    Allergy: No Known Allergies  Social Hx:   History   Social History  . Marital Status: Single    Spouse Name: N/A    Number of Children: N/A  . Years of Education: N/A   Occupational History  . Not on file.   Social History Main Topics  . Smoking status: Never Smoker   . Smokeless tobacco: Not on file  . Alcohol Use: Yes     Comment: occ  . Drug Use: No  . Sexual Activity: Yes    Birth Control/ Protection: None   Other Topics Concern  . Not on file   Social History Narrative   Lives in [place].  Relocated to [place] a few years ago.    Past Surgical Hx:  Past Surgical History  Procedure Laterality Date  . [place]ar[place]  [place]    Past Medical Hx:  Past Medical History  Diagnosis Date  . Hypertension     Past Gynecological History: G4 P3 estimated date of confinement 04/12/2012 intrauterine pregnancy 20 2/7  Patient's last menstrual period was 07/01/2012.  Family Hx: No family history on file.  Review of Systems:  Constitutional  Feels well, denies malaise good appetite Cardiovascular  No chest pain, shortness of breath, or edema  Pulmonary  No cough or wheeze.  Gastro Intestinal  No nausea, vomitting, or diarrhoea. No bright red blood per rectum, no abdominal pain, change in bowel movement, or constipation. Denies changes in the caliber of her stool Genito Urinary  No frequency, urgency, dysuria, reports fetal movement no rupture of membranes, no uterine contractions, sexually active without difficulty Musculo Skeletal  No myalgia, arthralgia, joint swelling or pain  Neurologic  No weakness, numbness, change in gait,  Psychology  No depression, anxiety, insomnia.   Vitals:   Blood pressure 133/79, pulse 86, temperature 98.4 F (36.9 C), temperature source Oral, resp. rate 20, height 5\' 4"  (1.626 m), weight 204 lb 6.4 oz (92.715 kg), last menstrual period 07/01/2012.  Physical Exam: BP 133/79  Pulse 86  Temp(Src) 98.4 F (36.9 C) (Oral)  Resp 20  Ht 5\' 4"  (1.626 m)  Wt 204 lb 6.4 oz (92.715 kg)  BMI 35.07 kg/m2  LMP 07/01/2012 WD in NAD Neck  Supple NROM, without any enlargements.  Lymph Node Survey No cervical supraclavicular or inguinal adenopathy Cardiovascular  Pulse normal rate, regularity and rhythm. S1 and S2 normal.  Lungs  Clear to auscultation bilateraly,  Good air movement.  Skin  No rash/lesions/breakdown  Psychiatry  Alert and oriented to person, place, and time appropriate manner and affect Abdomen  Normoactive bowel sounds, abdomen soft, non-tender.   Back No CVA tenderness Genito Urinary  Vulva/vagina: Normal external female genitalia.  No lesions. No discharge or bleeding.  Bladder/urethra:  No lesions or masses  Vagina: laterally displaced to the right.  Mass palpable lateral to the left vaginal sidewall. Appears to be in  the rectal fossa.    Cervix: Probably normal   Uterus: Date nontender no palpable contraction Adnexa: Unable to assess because of the presence of the mass however no nodularity was appreciated in the cul-de-sac. Rectal  Good tone, large mass palpable in the left pararectal fossa and visible externally measuring approximately 10 cm on the perineum Extremities  No bilateral cyanosis, clubbing or edema.   Laurette Schimke, MD, PhD 11/29/2012, 9:44 AM

## 2012-12-03 ENCOUNTER — Ambulatory Visit (INDEPENDENT_AMBULATORY_CARE_PROVIDER_SITE_OTHER): Payer: Medicaid Other | Admitting: Family Medicine

## 2012-12-03 ENCOUNTER — Encounter: Payer: Self-pay | Admitting: Family Medicine

## 2012-12-03 ENCOUNTER — Other Ambulatory Visit (HOSPITAL_COMMUNITY)
Admission: RE | Admit: 2012-12-03 | Discharge: 2012-12-03 | Disposition: A | Discharge status: Home / Self Care | Payer: Medicaid Other | Source: Ambulatory Visit | Attending: Family Medicine | Admitting: Family Medicine

## 2012-12-03 VITALS — BP 124/85 | Temp 97.2°F | Wt 206.3 lb

## 2012-12-03 DIAGNOSIS — Z01419 Encounter for gynecological examination (general) (routine) without abnormal findings: Secondary | ICD-10-CM | POA: Insufficient documentation

## 2012-12-03 DIAGNOSIS — Z113 Encounter for screening for infections with a predominantly sexual mode of transmission: Secondary | ICD-10-CM | POA: Insufficient documentation

## 2012-12-03 DIAGNOSIS — R1900 Intra-abdominal and pelvic swelling, mass and lump, unspecified site: Secondary | ICD-10-CM

## 2012-12-03 DIAGNOSIS — N760 Acute vaginitis: Secondary | ICD-10-CM | POA: Insufficient documentation

## 2012-12-03 DIAGNOSIS — O10019 Pre-existing essential hypertension complicating pregnancy, unspecified trimester: Secondary | ICD-10-CM

## 2012-12-03 DIAGNOSIS — D573 Sickle-cell trait: Secondary | ICD-10-CM

## 2012-12-03 DIAGNOSIS — O10012 Pre-existing essential hypertension complicating pregnancy, second trimester: Secondary | ICD-10-CM

## 2012-12-03 DIAGNOSIS — Z1151 Encounter for screening for human papillomavirus (HPV): Secondary | ICD-10-CM | POA: Insufficient documentation

## 2012-12-03 DIAGNOSIS — O0990 Supervision of high risk pregnancy, unspecified, unspecified trimester: Secondary | ICD-10-CM | POA: Insufficient documentation

## 2012-12-03 LAB — POCT URINALYSIS DIP (DEVICE)
Ketones, ur: NEGATIVE mg/dL
Leukocytes, UA: NEGATIVE
Protein, ur: NEGATIVE mg/dL
Urobilinogen, UA: 0.2 mg/dL (ref 0.0–1.0)

## 2012-12-03 MED ORDER — METRONIDAZOLE 500 MG PO TABS: 500.0000 mg | ORAL_TABLET | Freq: Two times a day (BID) | ORAL | Status: AC

## 2012-12-03 NOTE — Patient Instructions (Signed)
Preeclampsia and Eclampsia Preeclampsia is a condition of high blood pressure during pregnancy. It can happen at 20 weeks or later in pregnancy. If high blood pressure occurs in the second half of pregnancy with no other symptoms, it is called gestational hypertension and goes away after the baby is born. If any of the symptoms listed below develop with gestational hypertension, it is then called preeclampsia. Eclampsia (convulsions) may follow preeclampsia. This is one of the reasons for regular prenatal checkups. Early diagnosis and treatment are very important to prevent eclampsia. CAUSES  There is no known cause of preeclampsia/eclampsia in pregnancy. There are several known conditions that may put the pregnant woman at risk, such as:  The first pregnancy.  Having preeclampsia in a past pregnancy.  Having lasting (chronic) high blood pressure.  Having multiples (twins, triplets).  Being age 34 or older.  African American ethnic background.  Having kidney disease or diabetes.  Medical conditions such as lupus or blood diseases.  Being overweight (obese). SYMPTOMS   High blood pressure.  Headaches.  Sudden weight gain.  Swelling of hands, face, legs, and feet.  Protein in the urine.  Feeling sick to your stomach (nauseous) and throwing up (vomiting).  Vision problems (blurred or double vision).  Numbness in the face, arms, legs, and feet.  Dizziness.  Slurred speech.  Preeclampsia can cause growth retardation in the fetus.  Separation (abruption) of the placenta.  Not enough fluid in the amniotic sac (oligohydramnios).  Sensitivity to bright lights.  Belly (abdominal) pain. DIAGNOSIS  If protein is found in the urine in the second half of pregnancy, this is considered preeclampsia. Other symptoms mentioned above may also be present. TREATMENT  It is necessary to treat this.  Your caregiver may prescribe bed rest early in this condition. Plenty of rest and  salt restriction may be all that is needed.  Medicines may be necessary to lower blood pressure if the condition does not respond to more conservative measures.  In more severe cases, hospitalization may be needed:  For treatment of blood pressure.  To control fluid retention.  To monitor the baby to see if the condition is causing harm to the baby.  Hospitalization is the best way to treat the first sign of preeclampsia. This is so the mother and baby can be watched closely and blood tests can be done effectively and correctly.  If the condition becomes severe, it may be necessary to induce labor or to remove the infant by surgical means (cesarean section). The best cure for preeclampsia/eclampsia is to deliver the baby. Preeclampsia and eclampsia involve risks to mother and infant. Your caregiver will discuss these risks with you. Together, you can work out the best possible approach to your problems. Make sure you keep your prenatal visits as scheduled. Not keeping appointments could result in a chronic or permanent injury, pain, disability to you, and death or injury to you or your unborn baby. If there is any problem keeping the appointment, you must call to reschedule. HOME CARE INSTRUCTIONS   Keep your prenatal appointments and tests as scheduled.  Tell your caregiver if you have any of the above risk factors.  Get plenty of rest and sleep.  Eat a balanced diet that is low in salt, and do not add salt to your food.  Avoid stressful situations.  Only take over-the-counter and prescriptions medicines for pain, discomfort, or fever as directed by your caregiver. SEEK IMMEDIATE MEDICAL CARE IF:   You develop severe swelling  anywhere in the body. This usually occurs in the legs.  You gain 5 lb/2.3 kg or more in a week.  You develop a severe headache, dizziness, problems with your vision, or confusion.  You have abdominal pain, nausea, or vomiting.  You have a seizure.  You  have trouble moving any part of your body, or you develop numbness or problems speaking.  You have bruising or abnormal bleeding from anywhere in the body.  You develop a stiff neck.  You pass out. MAKE SURE YOU:   Understand these instructions.  Will watch your condition.  Will get help right away if you are not doing well or get worse. Document Released: 02/12/2000 Document Revised: 05/09/2011 Document Reviewed: 09/28/2007 [facility] Patient Information 2014 [place], [place].  Pregnancy - Second Trimester The second trimester of pregnancy (3 to 6 months) is a period of rapid growth for you and your baby. At the end of the sixth month, your baby is about 9 inches long and weighs 1 1/2 pounds. You will begin to feel the baby move between 18 and 20 weeks of the pregnancy. This is called quickening. Weight gain is faster. A clear fluid (colostrum) may leak out of your breasts. You may feel small contractions of the womb (uterus). This is known as false labor or Braxton-Hicks contractions. This is like a practice for labor when the baby is ready to be born. Usually, the problems with morning sickness have usually passed by the end of your first trimester. Some women develop small dark blotches (called cholasma, mask of pregnancy) on their face that usually goes away after the baby is born. Exposure to the sun makes the blotches worse. Acne may also develop in some pregnant women and pregnant women who have acne, may find that it goes away. PRENATAL EXAMS  Blood work may continue to be done during prenatal exams. These tests are done to check on your health and the probable health of your baby. Blood work is used to follow your blood levels (hemoglobin). Anemia (low hemoglobin) is common during pregnancy. Iron and vitamins are given to help prevent this. You will also be checked for diabetes between 24 and 28 weeks of the pregnancy. Some of the previous blood tests may be repeated.  The size of the  uterus is measured during each visit. This is to make sure that the baby is continuing to grow properly according to the dates of the pregnancy.  Your blood pressure is checked every prenatal visit. This is to make sure you are not getting toxemia.  Your urine is checked to make sure you do not have an infection, diabetes or protein in the urine.  Your weight is checked often to make sure gains are happening at the suggested rate. This is to ensure that both you and your baby are growing normally.  Sometimes, an ultrasound is performed to confirm the proper growth and development of the baby. This is a test which bounces harmless sound waves off the baby so your caregiver can more accurately determine due dates. Sometimes, a test is done on the amniotic fluid surrounding the baby. This test is called an amniocentesis. The amniotic fluid is obtained by sticking a needle into the belly (abdomen). This is done to check the chromosomes in instances where there is a concern about possible genetic problems with the baby. It is also sometimes done near the end of pregnancy if an early delivery is required. In this case, it is done to help  make sure the baby's lungs are mature enough for the baby to live outside of the womb. CHANGES OCCURING IN THE SECOND TRIMESTER OF PREGNANCY Your body goes through many changes during pregnancy. They vary from person to person. Talk to your caregiver about changes you notice that you are concerned about.  During the second trimester, you will likely have an increase in your appetite. It is normal to have cravings for certain foods. This varies from person to person and pregnancy to pregnancy.  Your lower abdomen will begin to bulge.  You may have to urinate more often because the uterus and baby are pressing on your bladder. It is also common to get more bladder infections during pregnancy. You can help this by drinking lots of fluids and emptying your bladder before and  after intercourse.  You may begin to get stretch marks on your hips, abdomen, and breasts. These are normal changes in the body during pregnancy. There are no exercises or medicines to take that prevent this change.  You may begin to develop swollen and bulging veins (varicose veins) in your legs. Wearing support hose, elevating your feet for 15 minutes, 3 to 4 times a day and limiting salt in your diet helps lessen the problem.  Heartburn may develop as the uterus grows and pushes up against the stomach. Antacids recommended by your caregiver helps with this problem. Also, eating smaller meals 4 to 5 times a day helps.  Constipation can be treated with a stool softener or adding bulk to your diet. Drinking lots of fluids, and eating vegetables, fruits, and whole grains are helpful.  Exercising is also helpful. If you have been very active up until your pregnancy, most of these activities can be continued during your pregnancy. If you have been less active, it is helpful to start an exercise program such as walking.  Hemorrhoids may develop at the end of the second trimester. Warm sitz baths and hemorrhoid cream recommended by your caregiver helps hemorrhoid problems.  Backaches may develop during this time of your pregnancy. Avoid heavy lifting, wear low heal shoes, and practice good posture to help with backache problems.  Some pregnant women develop tingling and numbness of their hand and fingers because of swelling and tightening of ligaments in the wrist (carpel tunnel syndrome). This goes away after the baby is born.  As your breasts enlarge, you may have to get a bigger bra. Get a comfortable, cotton, support bra. Do not get a nursing bra until the last month of the pregnancy if you will be nursing the baby.  You may get a dark line from your belly button to the pubic area called the linea nigra.  You may develop rosy cheeks because of increase blood flow to the face.  You may develop  spider looking lines of the face, neck, arms, and chest. These go away after the baby is born. HOME CARE INSTRUCTIONS   It is extremely important to avoid all smoking, herbs, alcohol, and unprescribed drugs during your pregnancy. These chemicals affect the formation and growth of the baby. Avoid these chemicals throughout the pregnancy to ensure the delivery of a healthy infant.  Most of your home care instructions are the same as suggested for the first trimester of your pregnancy. Keep your caregiver's appointments. Follow your caregiver's instructions regarding medicine use, exercise, and diet.  During pregnancy, you are providing food for you and your baby. Continue to eat regular, well-balanced meals. Choose foods such as meat, fish,  milk and other low fat dairy products, vegetables, fruits, and whole-grain breads and cereals. Your caregiver will tell you of the ideal weight gain.  A physical sexual relationship may be continued up until near the end of pregnancy if there are no other problems. Problems could include early (premature) leaking of amniotic fluid from the membranes, vaginal bleeding, abdominal pain, or other medical or pregnancy problems.  Exercise regularly if there are no restrictions. Check with your caregiver if you are unsure of the safety of some of your exercises. The greatest weight gain will occur in the last 2 trimesters of pregnancy. Exercise will help you:  Control your weight.  Get you in shape for labor and delivery.  Lose weight after you have the baby.  Wear a good support or jogging bra for breast tenderness during pregnancy. This may help if worn during sleep. Pads or tissues may be used in the bra if you are leaking colostrum.  Do not use hot tubs, steam rooms or saunas throughout the pregnancy.  Wear your seat belt at all times when driving. This protects you and your baby if you are in an accident.  Avoid raw meat, uncooked cheese, cat litter boxes,  and soil used by cats. These carry germs that can cause birth defects in the baby.  The second trimester is also a good time to visit your dentist for your dental health if this has not been done yet. Getting your teeth cleaned is okay. Use a soft toothbrush. Brush gently during pregnancy.  It is easier to leak urine during pregnancy. Tightening up and strengthening the pelvic muscles will help with this problem. Practice stopping your urination while you are going to the bathroom. These are the same muscles you need to strengthen. It is also the muscles you would use as if you were trying to stop from passing gas. You can practice tightening these muscles up 10 times a set and repeating this about 3 times per day. Once you know what muscles to tighten up, do not perform these exercises during urination. It is more likely to contribute to an infection by backing up the urine.  Ask for help if you have financial, counseling, or nutritional needs during pregnancy. Your caregiver will be able to offer counseling for these needs as well as refer you for other special needs.  Your skin may become oily. If so, wash your face with mild soap, use non-greasy moisturizer and oil or cream based makeup. MEDICINES AND DRUG USE IN PREGNANCY  Take prenatal vitamins as directed. The vitamin should contain 1 milligram of folic acid. Keep all vitamins out of reach of children. Only a couple vitamins or tablets containing iron may be fatal to a baby or young child when ingested.  Avoid use of all medicines, including herbs, over-the-counter medicines, not prescribed or suggested by your caregiver. Only take over-the-counter or prescription medicines for pain, discomfort, or fever as directed by your caregiver. Do not use aspirin.  Let your caregiver also know about herbs you may be using.  Alcohol is related to a number of birth defects. This includes fetal alcohol syndrome. All alcohol, in any form, should be avoided  completely. Smoking will cause low birth rate and premature babies.  Street or illegal drugs are very harmful to the baby. They are absolutely forbidden. A baby born to an addicted mother will be addicted at birth. The baby will go through the same withdrawal an adult does. SEEK MEDICAL CARE IF:  You have any concerns or worries during your pregnancy. It is better to call with your questions if you feel they cannot wait, rather than worry about them. SEEK IMMEDIATE MEDICAL CARE IF:   An unexplained oral temperature above 102 F (38.9 C) develops, or as your caregiver suggests.  You have leaking of fluid from the vagina (birth canal). If leaking membranes are suspected, take your temperature and tell your caregiver of this when you call.  There is vaginal spotting, bleeding, or passing clots. Tell your caregiver of the amount and how many pads are used. Light spotting in pregnancy is common, especially following intercourse.  You develop a bad smelling vaginal discharge with a change in the color from clear to white.  You continue to feel sick to your stomach (nauseated) and have no relief from remedies suggested. You vomit blood or coffee ground-like materials.  You lose more than 2 pounds of weight or gain more than 2 pounds of weight over 1 week, or as suggested by your caregiver.  You notice swelling of your face, hands, feet, or legs.  You get exposed to [place] measles and have never had them.  You are exposed to fifth disease or chickenpox.  You develop belly (abdominal) pain. Round ligament discomfort is a common non-cancerous (benign) cause of abdominal pain in pregnancy. Your caregiver still must evaluate you.  You develop a bad headache that does not go away.  You develop fever, diarrhea, pain with urination, or shortness of breath.  You develop visual problems, blurry, or double vision.  You fall or are in a car accident or any kind of trauma.  There is mental or  physical violence at home. Document Released: 02/08/2001 Document Revised: 11/09/2011 Document Reviewed: 08/13/2008 [facility] Patient Information 2014 [place], [place].  Breastfeeding A change in hormones during your pregnancy causes growth of your breast tissue and an increase in number and size of milk ducts. The hormone prolactin allows proteins, sugars, and fats from your blood supply to make breast milk in your milk-producing glands. The hormone progesterone prevents breast milk from being released before the birth of your baby. After the birth of your baby, your progesterone level decreases allowing breast milk to be released. Thoughts of your baby, as well as his or her sucking or crying, can stimulate the release of milk from the milk-producing glands. Deciding to breastfeed (nurse) is one of the best choices you can make for you and your baby. The information that follows gives a brief review of the benefits, as well as other important skills to know about breastfeeding. BENEFITS OF BREASTFEEDING For your baby  The first milk (colostrum) helps your baby's digestive system function better.   There are antibodies in your milk that help your baby fight off infections.   Your baby has a lower incidence of asthma, allergies, and sudden infant death syndrome (SIDS).   The nutrients in breast milk are better for your baby than infant formulas.  Breast milk improves your baby's brain development.   Your baby will have less gas, colic, and constipation.  Your baby is less likely to develop other conditions, such as childhood obesity, asthma, or diabetes mellitus. For you  Breastfeeding helps develop a very special bond between you and your baby.   Breastfeeding is convenient, always available at the correct temperature, and costs nothing.   Breastfeeding helps to burn calories and helps you lose the weight gained during pregnancy.   Breastfeeding makes your uterus contract back  down  to normal size faster and slows bleeding following delivery.   Breastfeeding mothers have a lower risk of developing osteoporosis or breast or ovarian cancer later in life.  BREASTFEEDING FREQUENCY  A healthy, full-term baby may breastfeed as often as every hour or space his or her feedings to every 3 hours. Breastfeeding frequency will vary from baby to baby.   Newborns should be fed no less than every 2 3 hours during the day and every 4 5 hours during the night. You should breastfeed a minimum of 8 feedings in a 24 hour period.  Awaken your baby to breastfeed if it has been 3 4 hours since the last feeding.  Breastfeed when you feel the need to reduce the fullness of your breasts or when your newborn shows signs of hunger. Signs that your baby may be hungry include:  Increased alertness or activity.  Stretching.  Movement of the head from side to side.  Movement of the head and opening of the mouth when the corner of the mouth or cheek is stroked (rooting).  Increased sucking sounds, smacking lips, cooing, sighing, or squeaking.  Hand-to-mouth movements.  Increased sucking of fingers or hands.  Fussing.  Intermittent crying.  Signs of extreme hunger will require calming and consoling before you try to feed your baby. Signs of extreme hunger may include:  Restlessness.  A loud, strong cry.  Screaming.  Frequent feeding will help you make more milk and will help prevent problems, such as sore nipples and engorgement of the breasts.  BREASTFEEDING   Whether lying down or sitting, be sure that the baby's abdomen is facing your abdomen.   Support your breast with 4 fingers under your breast and your thumb above your nipple. Make sure your fingers are well away from your nipple and your baby's mouth.   Stroke your baby's lips gently with your finger or nipple.   When your baby's mouth is open wide enough, place all of your nipple and as much of the colored  area around your nipple (areola) as possible into your baby's mouth.  More areola should be visible above his or her upper lip than below his or her lower lip.  Your baby's tongue should be between his or her lower gum and your breast.  Ensure that your baby's mouth is correctly positioned around the nipple (latched). Your baby's lips should create a seal on your breast.  Signs that your baby has effectively latched onto your nipple include:  Tugging or sucking without pain.  Swallowing heard between sucks.  Absent click or smacking sound.  Muscle movement above and in front of his or her ears with sucking.  Your baby must suck about 2 3 minutes in order to get your milk. Allow your baby to feed on each breast as long as he or she wants. Nurse your baby until he or she unlatches or falls asleep at the first breast, then offer the second breast.  Signs that your baby is full and satisfied include:  A gradual decrease in the number of sucks or complete cessation of sucking.  Falling asleep.  Extension or relaxation of his or her body.  Retention of a small amount of milk in his or her mouth.  Letting go of your breast by himself or herself.  Signs of effective breastfeeding in you include:  Breasts that have increased firmness, weight, and size prior to feeding.  Breasts that are softer after nursing.  Increased milk volume, as well as  a change in milk consistency and color by the 5th day of breastfeeding.  Breast fullness relieved by breastfeeding.  Nipples are not sore, cracked, or bleeding.  If needed, break the suction by putting your finger into the corner of your baby's mouth and sliding your finger between his or her gums. Then, remove your breast from his or her mouth.  It is common for babies to spit up a small amount after a feeding.  Babies often swallow air during feeding. This can make babies fussy. Burping your baby between breasts can help with  this.  Vitamin D supplements are recommended for babies who get only breast milk.  Avoid using a pacifier during your baby's first 4 6 weeks.  Avoid supplemental feedings of water, formula, or juice in place of breastfeeding. Breast milk is all the food your baby needs. It is not necessary for your baby to have water or formula. Your breasts will make more milk if supplemental feedings are avoided during the early weeks. HOW TO TELL WHETHER YOUR BABY IS GETTING ENOUGH BREAST MILK Wondering whether or not your baby is getting enough milk is a common concern among mothers. You can be assured that your baby is getting enough milk if:   Your baby is actively sucking and you hear swallowing.   Your baby seems relaxed and satisfied after a feeding.   Your baby nurses at least 8 12 times in a 24 hour time period.  During the first 35 52 days of age:  Your baby is wetting at least 3 5 diapers in a 24 hour period. The urine should be clear and pale yellow.  Your baby is having at least 3 4 stools in a 24 hour period. The stool should be soft and yellow.  At 72 10 days of age, your baby is having at least 3 6 stools in a 24 hour period. The stool should be seedy and yellow by 13 days of age.  Your baby has a weight loss less than 7 10% during the first 44 days of age.  Your baby does not lose weight after 36 22 days of age.  Your baby gains 4 7 ounces each week after he or she is 36 days of age.  Your baby gains weight by 15 days of age and is back to birth weight within 2 weeks. ENGORGEMENT In the first week after your baby is born, you may experience extremely full breasts (engorgement). When engorged, your breasts may feel heavy, warm, or tender to the touch. Engorgement peaks within 24 48 hours after delivery of your baby.  Engorgement may be reduced by:  Continuing to breastfeed.  Increasing the frequency of breastfeeding.  Taking warm showers or applying warm, moist heat to your breasts  just before each feeding. This increases circulation and helps the milk flow.   Gently massaging your breast before and during the feedings. With your fingertips, massage from your chest wall towards your nipple in a circular motion.   Ensuring that your baby empties at least one breast at every feeding. It also helps to start the next feeding on the opposite breast.   Expressing breast milk by hand or by using a breast pump to empty the breasts if your baby is sleepy, or not nursing well. You may also want to express milk if you are returning to work oryou feel you are getting engorged.  Ensuring your baby is latched on and positioned properly while breastfeeding. If you follow these  suggestions, your engorgement should improve in 24 48 hours. If you are still experiencing difficulty, call your lactation consultant or caregiver.  CARING FOR YOURSELF Take care of your breasts.  Bathe or shower daily.   Avoid using soap on your nipples.   Wear a supportive bra. Avoid wearing underwire style bras.  Air dry your nipples for a 3 after each feeding.   Use only cotton bra pads to absorb breast milk leakage. Leaking of breast milk between feedings is normal.   Use only pure lanolin on your nipples after nursing. You do not need to wash it off before feeding your baby again. Another option is to express a few drops of breast milk and gently massage that milk into your nipples.  Continue breast self-awareness checks. Take care of yourself.  Eat healthy foods. Alternate 3 meals with 3 snacks.  Avoid foods that you notice affect your baby in a bad way.  Drink milk, fruit juice, and water to satisfy your thirst (about 8 glasses a day).   Rest often, relax, and take your prenatal vitamins to prevent fatigue, stress, and anemia.  Avoid chewing and smoking tobacco.  Avoid alcohol and drug use.  Take over-the-counter and prescribed medicine only as directed by your  caregiver or pharmacist. You should always check with your caregiver or pharmacist before taking any new medicine, vitamin, or herbal supplement.  Know that pregnancy is possible while breastfeeding. If desired, talk to your caregiver about family planning and safe birth control methods that may be used while breastfeeding. SEEK MEDICAL CARE IF:   You feel like you want to stop breastfeeding or have become frustrated with breastfeeding.  You have painful breasts or nipples.  Your nipples are cracked or bleeding.  Your breasts are red, tender, or warm.  You have a swollen area on either breast.  You have a fever or chills.  You have nausea or vomiting.  You have drainage from your nipples.  Your breasts do not become full before feedings by the 5th day after delivery.  You feel sad and depressed.  Your baby is too sleepy to eat well.  Your baby is having trouble sleeping.   Your baby is wetting less than 3 diapers in a 24 hour period.  Your baby has less than 3 stools in a 24 hour period.  Your baby's skin or the white part of his or her eyes becomes more yellow.   Your baby is not gaining weight by 30 days of age. MAKE SURE YOU:   Understand these instructions.  Will watch your condition.  Will get help right away if you are not doing well or get worse. Document Released: 02/14/2005 Document Revised: 11/09/2011 Document Reviewed: 09/21/2011 [facility] Patient Information 2014 [place], [place].

## 2012-12-03 NOTE — Progress Notes (Signed)
P= 84 Pt. Has a cyst and reports having pain/pressure while sitting down. Pt. Would like to be checked for BV. Pt. Reports having sore throat and feeling fatigued and SOB with activity since Friday. Pt. Refused flu vaccine today.

## 2012-12-03 NOTE — Progress Notes (Signed)
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  It is cystic and was present in previous pregnancies.  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 Advised to complete surgery pp. Address mode of delivery as we get closer to dates. Will continue care with [facility] Gen. Surg. To manage cyst. Treat BV.

## 2012-12-04 ENCOUNTER — Telehealth: Payer: Self-pay | Admitting: Gynecologic Oncology

## 2012-12-04 LAB — WET PREP, GENITAL: Yeast Wet Prep HPF POC: NONE SEEN

## 2012-12-04 NOTE — Telephone Encounter (Signed)
Called patient to review the treatment plan delineated for delivery.  Message left.

## 2012-12-06 ENCOUNTER — Ambulatory Visit: Payer: Medicaid Other | Admitting: Gynecologic Oncology

## 2012-12-07 ENCOUNTER — Encounter: Payer: Self-pay | Admitting: *Deleted

## 2012-12-18 ENCOUNTER — Ambulatory Visit (HOSPITAL_COMMUNITY)
Admission: RE | Admit: 2012-12-18 | Discharge: 2012-12-18 | Disposition: A | Discharge status: Home / Self Care | Payer: Medicaid Other | Source: Ambulatory Visit | Attending: Obstetrics and Gynecology | Admitting: Obstetrics and Gynecology

## 2012-12-18 VITALS — BP 135/73 | HR 95 | Wt 214.5 lb

## 2012-12-18 DIAGNOSIS — O10019 Pre-existing essential hypertension complicating pregnancy, unspecified trimester: Secondary | ICD-10-CM | POA: Insufficient documentation

## 2012-12-18 DIAGNOSIS — Z3689 Encounter for other specified antenatal screening: Secondary | ICD-10-CM | POA: Insufficient documentation

## 2012-12-18 DIAGNOSIS — O09299 Supervision of pregnancy with other poor reproductive or obstetric history, unspecified trimester: Secondary | ICD-10-CM | POA: Insufficient documentation

## 2012-12-18 DIAGNOSIS — O0990 Supervision of high risk pregnancy, unspecified, unspecified trimester: Secondary | ICD-10-CM

## 2012-12-18 DIAGNOSIS — R1900 Intra-abdominal and pelvic swelling, mass and lump, unspecified site: Secondary | ICD-10-CM

## 2012-12-18 NOTE — Progress Notes (Signed)
Brittney Sandoval was seen for ultrasound appointment today.  Please see [facility] report for details.  

## 2012-12-24 ENCOUNTER — Ambulatory Visit (INDEPENDENT_AMBULATORY_CARE_PROVIDER_SITE_OTHER): Payer: Medicaid Other | Admitting: Obstetrics and Gynecology

## 2012-12-24 ENCOUNTER — Telehealth: Payer: Self-pay | Admitting: General Practice

## 2012-12-24 ENCOUNTER — Encounter: Payer: Self-pay | Admitting: Obstetrics and Gynecology

## 2012-12-24 VITALS — BP 128/82 | Temp 96.7°F | Wt 210.5 lb

## 2012-12-24 DIAGNOSIS — O10019 Pre-existing essential hypertension complicating pregnancy, unspecified trimester: Secondary | ICD-10-CM

## 2012-12-24 DIAGNOSIS — E669 Obesity, unspecified: Secondary | ICD-10-CM

## 2012-12-24 DIAGNOSIS — R1900 Intra-abdominal and pelvic swelling, mass and lump, unspecified site: Secondary | ICD-10-CM

## 2012-12-24 DIAGNOSIS — D573 Sickle-cell trait: Secondary | ICD-10-CM

## 2012-12-24 DIAGNOSIS — O0990 Supervision of high risk pregnancy, unspecified, unspecified trimester: Secondary | ICD-10-CM

## 2012-12-24 DIAGNOSIS — O10012 Pre-existing essential hypertension complicating pregnancy, second trimester: Secondary | ICD-10-CM

## 2012-12-24 LAB — POCT URINALYSIS DIP (DEVICE)
Ketones, ur: NEGATIVE mg/dL
Protein, ur: NEGATIVE mg/dL
Specific Gravity, Urine: 1.02 (ref 1.005–1.030)
pH: 7 (ref 5.0–8.0)

## 2012-12-24 NOTE — Progress Notes (Signed)
Patient doing well, complaining of increasing pelvic pressure. Patient has f/u ultrasound with MFM next month. Cystic mass now involving pelvis and elevating uterus and cervix. Patient not amenable for vaginal birth at this time unless mass is drained (Patient states it has been drained in the past prior to her deliveries). Patient does not have a f/u appointment with Gen Surg as no interventions are planned unless the patient becomes uncomfortable.  FM/PTL precautions reviewed

## 2012-12-24 NOTE — Progress Notes (Signed)
Pulse: 72

## 2012-12-24 NOTE — Telephone Encounter (Signed)
Patient called and left message stating she was here earlier today but forgot to ask for a refill on her pain medication.

## 2012-12-25 MED ORDER — HYDROCODONE-ACETAMINOPHEN 5-325 MG PO TABS: 1.0000 | ORAL_TABLET | ORAL | Status: DC | PRN

## 2012-12-25 NOTE — Telephone Encounter (Addendum)
I consulted with Dr. Jolayne Panther earlier today regarding pt's request for pain med refill. She stated that pt has legitimate reason for pain and approved refill of Hydrocodone. Since she is not at the hospital, she asked for Dr. Reola Calkins to sign printed Rx. Dr. Jolayne Panther also stated that she does not want pt to rely on pain medication if her pain continues to increase. She states that she had discussion with pt that if the pain is becoming worse and more frequent, she may need to consider intervention from General Surgery.  I called pt to discuss this information and to inform her of refill approval and got a voice mail. I stated that I will call back tomorrow.  Pt returned my call and I informed her of Rx which has been prepared for her. She may pick it up on Wednesday 10/29 after 0800. I also shared Dr. Deretha Emory concern regarding pain management of the cyst. Pt states she understands the concern and if the pain becomes more consistent or severe, she will contact the [facility] Surgery.

## 2012-12-28 ENCOUNTER — Telehealth: Payer: Self-pay | Admitting: *Deleted

## 2012-12-28 NOTE — Telephone Encounter (Signed)
Patient wanted to know where she goes if she needs to be checked out. I advised that she should go to [facility] if she needs evaluation. Patient had no further questions.

## 2012-12-31 ENCOUNTER — Telehealth: Payer: Self-pay | Admitting: *Deleted

## 2012-12-31 DIAGNOSIS — B3731 Acute candidiasis of vulva and vagina: Secondary | ICD-10-CM

## 2012-12-31 DIAGNOSIS — B373 Candidiasis of vulva and vagina: Secondary | ICD-10-CM

## 2012-12-31 MED ORDER — FLUCONAZOLE 150 MG PO TABS: 150.0000 mg | ORAL_TABLET | Freq: Once | ORAL | Status: DC

## 2012-12-31 NOTE — Telephone Encounter (Signed)
Patient left a message that she would like a prescription for yeast. She has discharge and itching.

## 2013-01-10 ENCOUNTER — Other Ambulatory Visit: Payer: Self-pay | Admitting: Obstetrics & Gynecology

## 2013-01-10 ENCOUNTER — Encounter: Payer: Self-pay | Admitting: *Deleted

## 2013-01-10 DIAGNOSIS — I10 Essential (primary) hypertension: Secondary | ICD-10-CM

## 2013-01-10 DIAGNOSIS — O99210 Obesity complicating pregnancy, unspecified trimester: Secondary | ICD-10-CM

## 2013-01-14 ENCOUNTER — Encounter: Payer: Medicaid Other | Admitting: Obstetrics & Gynecology

## 2013-01-14 ENCOUNTER — Ambulatory Visit (INDEPENDENT_AMBULATORY_CARE_PROVIDER_SITE_OTHER): Payer: Medicaid Other | Admitting: Obstetrics & Gynecology

## 2013-01-14 VITALS — BP 126/80 | Temp 97.1°F | Wt 214.3 lb

## 2013-01-14 DIAGNOSIS — R1900 Intra-abdominal and pelvic swelling, mass and lump, unspecified site: Secondary | ICD-10-CM

## 2013-01-14 DIAGNOSIS — O0990 Supervision of high risk pregnancy, unspecified, unspecified trimester: Secondary | ICD-10-CM

## 2013-01-14 DIAGNOSIS — A499 Bacterial infection, unspecified: Secondary | ICD-10-CM

## 2013-01-14 DIAGNOSIS — B379 Candidiasis, unspecified: Secondary | ICD-10-CM

## 2013-01-14 DIAGNOSIS — N760 Acute vaginitis: Secondary | ICD-10-CM

## 2013-01-14 DIAGNOSIS — O10013 Pre-existing essential hypertension complicating pregnancy, third trimester: Secondary | ICD-10-CM

## 2013-01-14 DIAGNOSIS — O10019 Pre-existing essential hypertension complicating pregnancy, unspecified trimester: Secondary | ICD-10-CM

## 2013-01-14 DIAGNOSIS — B9689 Other specified bacterial agents as the cause of diseases classified elsewhere: Secondary | ICD-10-CM

## 2013-01-14 LAB — POCT URINALYSIS DIP (DEVICE)
Hgb urine dipstick: NEGATIVE
Nitrite: NEGATIVE
Protein, ur: NEGATIVE mg/dL
Specific Gravity, Urine: 1.02 (ref 1.005–1.030)
Urobilinogen, UA: 0.2 mg/dL (ref 0.0–1.0)
pH: 6 (ref 5.0–8.0)

## 2013-01-14 MED ORDER — TERCONAZOLE 0.4 % VA CREA: 1.0000 | TOPICAL_CREAM | Freq: Every day | VAGINAL | Status: DC

## 2013-01-14 NOTE — Progress Notes (Signed)
P= 90 C/o of lower abdominal/pelvic pressure and hip stiffness.  Pt. C/o of white discharge with slight odor and irritation. Took diflucan last week which was ordered over the phone but pt. States the cream usually works best.

## 2013-01-14 NOTE — Progress Notes (Signed)
White discharge noted on exam, wet prep done.  Terazol cream prescribed.  Reports Braxton Hicks contractions, closed cervix on exam. Cervix is deviated anteriorly by the large cystic mass. Will follow up MFM recommendations regarding drainage of pelvic cystic mass prior to delivery; may need IR consultation.  Will repeat 1 hr GTT, and do third trimester labs next visit.   Counseled about Tdap, patient will decide by next visit. No other complaints or concerns.  Fetal movement and labor precautions reviewed.

## 2013-01-14 NOTE — Patient Instructions (Addendum)
Tetanus, Diphtheria, Pertussis (Tdap) Vaccine What You Need to Know WHY GET VACCINATED? Tetanus, diphtheria and pertussis can be very serious diseases, even for adolescents and adults. Tdap vaccine can protect us from these diseases. TETANUS (Lockjaw) causes painful muscle tightening and stiffness, usually all over the body.  It can lead to tightening of muscles in the head and neck so you can't open your mouth, swallow, or sometimes even breathe. Tetanus kills about 1 out of 5 people who are infected. DIPHTHERIA can cause a thick coating to form in the back of the throat.  It can lead to breathing problems, paralysis, heart failure, and death. PERTUSSIS (Whooping Cough) causes severe coughing spells, which can cause difficulty breathing, vomiting and disturbed sleep.  It can also lead to weight loss, incontinence, and rib fractures. Up to 2 in 100 adolescents and 5 in 100 adults with pertussis are hospitalized or have complications, which could include pneumonia and death. These diseases are caused by bacteria. Diphtheria and pertussis are spread from person to person through coughing or sneezing. Tetanus enters the body through cuts, scratches, or wounds. Before vaccines, the [place] saw as many as 200,000 cases a year of diphtheria and pertussis, and hundreds of cases of tetanus. Since vaccination began, tetanus and diphtheria have dropped by about 99% and pertussis by about 80%. TDAP VACCINE Tdap vaccine can protect adolescents and adults from tetanus, diphtheria, and pertussis. One dose of Tdap is routinely given at age 11 or 12. People who did not get Tdap at that age should get it as soon as possible. Tdap is especially important for health care professionals and anyone having close contact with a baby younger than 12 months. Pregnant women should get a dose of Tdap during every pregnancy, to protect the newborn from pertussis. Infants are most at risk for severe, life-threatening  complications from pertussis. A similar vaccine, called Td, protects from tetanus and diphtheria, but not pertussis. A Td booster should be given every 10 years. Tdap may be given as one of these boosters if you have not already gotten a dose. Tdap may also be given after a severe cut or burn to prevent tetanus infection. Your doctor can give you more information. Tdap may safely be given at the same time as other vaccines. SOME PEOPLE SHOULD NOT GET THIS VACCINE  If you ever had a life-threatening allergic reaction after a dose of any tetanus, diphtheria, or pertussis containing vaccine, OR if you have a severe allergy to any part of this vaccine, you should not get Tdap. Tell your doctor if you have any severe allergies.  If you had a coma, or long or multiple seizures within 7 days after a childhood dose of DTP or DTaP, you should not get Tdap, unless a cause other than the vaccine was found. You can still get Td.  Talk to your doctor if you:  have epilepsy or another nervous system problem,  had severe pain or swelling after any vaccine containing diphtheria, tetanus or pertussis,  ever had Guillain-Barr Syndrome (GBS),  aren't feeling well on the day the shot is scheduled. RISKS OF A VACCINE REACTION With any medicine, including vaccines, there is a chance of side effects. These are usually mild and go away on their own, but serious reactions are also possible. Brief fainting spells can follow a vaccination, leading to injuries from falling. Sitting or lying down for about 15 minutes can help prevent these. Tell your doctor if you feel dizzy or light-headed, or   have vision changes or ringing in the ears. Mild problems following Tdap (Did not interfere with activities)  Pain where the shot was given (about 3 in 4 adolescents or 2 in 3 adults)  Redness or swelling where the shot was given (about 1 person in 5)  Mild fever of at least 100.52F (up to about 1 in 25 adolescents or 1 in  100 adults)  Headache (about 3 or 4 people in 10)  Tiredness (about 1 person in 3 or 4)  Nausea, vomiting, diarrhea, stomach ache (up to 1 in 4 adolescents or 1 in 10 adults)  Chills, body aches, sore joints, rash, swollen glands (uncommon) Moderate problems following Tdap (Interfered with activities, but did not require medical attention)  Pain where the shot was given (about 1 in 5 adolescents or 1 in 100 adults)  Redness or swelling where the shot was given (up to about 1 in 16 adolescents or 1 in 25 adults)  Fever over 102F (about 1 in 100 adolescents or 1 in 250 adults)  Headache (about 3 in 20 adolescents or 1 in 10 adults)  Nausea, vomiting, diarrhea, stomach ache (up to 1 or 3 people in 100)  Swelling of the entire arm where the shot was given (up to about 3 in 100). Severe problems following Tdap (Unable to perform usual activities, required medical attention)  Swelling, severe pain, bleeding and redness in the arm where the shot was given (rare). A severe allergic reaction could occur after any vaccine (estimated less than 1 in a million doses). WHAT IF THERE IS A SERIOUS REACTION? What should I look for?  Look for anything that concerns you, such as signs of a severe allergic reaction, very high fever, or behavior changes. Signs of a severe allergic reaction can include hives, swelling of the face and throat, difficulty breathing, a fast heartbeat, dizziness, and weakness. These would start a few minutes to a few hours after the vaccination. What should I do?  If you think it is a severe allergic reaction or other emergency that can't wait, call 9-1-1 or get the person to the nearest hospital. Otherwise, call your doctor.  Afterward, the reaction should be reported to the "Vaccine Adverse Event Reporting System" (VAERS). Your doctor might file this report, or you can do it yourself through the VAERS web site at www.vaers.LAgents.no, or by calling 1-910-499-9465. VAERS is  only for reporting reactions. They do not give medical advice.  THE NATIONAL VACCINE INJURY COMPENSATION PROGRAM The National Vaccine Injury Compensation Program (VICP) is a federal program that was created to compensate people who may have been injured by certain vaccines. Persons who believe they may have been injured by a vaccine can learn about the program and about filing a claim by calling 1-915-700-9569 or visiting the VICP website at SpiritualWord.at. HOW CAN I LEARN MORE?  Ask your doctor.  Call your local or state health department.  Contact the Centers for Disease Control and Prevention (CDC):  Call (539) 706-1456 or visit CDC's website at PicCapture.uy. CDC Tdap Vaccine VIS (07/07/11) Document Released: 08/16/2011 Document Revised: 06/11/2012 Document Reviewed: 06/06/2012 [facility] Patient Information 2014 [place], [place].  Hypertension During Pregnancy Hypertension is also called high blood pressure. It can occur at any time in life and during pregnancy. When you have hypertension, there is extra pressure inside your blood vessels that carry blood from the heart to the rest of your body (arteries). Hypertension during pregnancy can cause problems for you and your baby. Your baby might  not weigh as much as it should at birth or might be born early (premature). Very bad cases of hypertension during pregnancy can be life-threatening.  There are different types of hypertension during pregnancy.   Chronic hypertension. This happens when a woman has hypertension before pregnancy and it continues during pregnancy.  Gestational hypertension. This is when hypertension develops during pregnancy.  Preeclampsia or toxemia of pregnancy. This is a very serious type of hypertension that develops only during pregnancy. It is a disease that affects the whole body (systemic) and can be very dangerous for both mother and baby.  Gestational hypertension and preeclampsia  usually go away after your baby is born. Blood pressure generally stabilizes within 6 weeks. Women who have hypertension during pregnancy have a greater chance of developing hypertension later in life or with future pregnancies. UNDERSTANDING BLOOD PRESSURE Blood pressure moves blood in your body. Sometimes, the force that moves the blood becomes too strong.  A blood pressure reading is given in 2 numbers and looks like a fraction.  The top number is called the systolic pressure. When your heart beats, it forces more blood to flow through the arteries. Pressure inside the arteries goes up.  The bottom number is the diastolic pressure. Pressure goes down between beats. That is when the heart is resting.  You may have hypertension if:  Your systolic blood pressure is above 140.  Your diastolic pressure is above 90. RISK FACTORS Some factors make you more likely to develop hypertension during pregnancy. Risk factors include:  Having hypertension before pregnancy.  Having hypertension during a previous pregnancy.  Being overweight.  Being older than 40.  Being pregnant with more than 1 baby (multiples).  Having diabetes or kidney problems. SYMPTOMS Chronic and gestational hypertension may not cause symptoms. Preeclampsia has symptoms, which may include:  Increased protein in your urine. Your caregiver will check for this at every prenatal visit.  Swelling of your hands and face.  Rapid weight gain.  Headaches.  Visual changes.  Being bothered by light.  Abdominal pain, especially in the right upper area.  Chest pain.  Shortness of breath.  Increased reflexes.  Seizures. Seizures occur with a more severe form of preeclampsia, called eclampsia. DIAGNOSIS   You may be diagnosed with hypertension during pregnancy during a regular prenatal exam. At each visit, tests may include:  Blood pressure checks.  A urine test to check for protein in your urine.  The type of  hypertension you are diagnosed with depends on when you developed it. It also depends on your specific blood pressure reading.  Developing hypertension before 20 weeks of pregnancy is consistent with chronic hypertension.  Developing hypertension after 20 weeks of pregnancy is consistent with gestational hypertension.  Hypertension with increased urinary protein is diagnosed as preeclampsia.  Blood pressure measurements that stay above 160 systolic or 110 diastolic are a sign of severe preeclampsia. TREATMENT Treatment for hypertension during pregnancy varies. Treatment depends on the type of hypertension and how serious it is.  If you take medicine for chronic hypertension, you may need to switch medicines.  Drugs called ACE inhibitors should not be taken during pregnancy.  Low-dose aspirin may be suggested for women who have risk factors for preeclampsia.  If you have gestational hypertension, you may need to take a blood pressure medicine that is safe during pregnancy. Your caregiver will recommend the appropriate medicine.  If you have severe preeclampsia, you may need to be in the hospital. Caregivers will watch you  and the baby very closely. You also may need to take medicine (magnesium sulfate) to prevent seizures and lower blood pressure.  Sometimes an early delivery is needed. This may be the case if the condition worsens. It would be done to protect you and the baby. The only cure for preeclampsia is delivery. HOME CARE INSTRUCTIONS  Schedule and keep all of your regular prenatal care.  Follow your caregiver's instructions for taking medicines. Tell your caregiver about all medicines you take. This includes over-the-counter medicines.  Eat as little salt as possible.  Get regular exercise.  Do not drink alcohol.  Do not use tobacco products.  Do not drink products with caffeine.  Lie on your left side when resting.  Tell your doctor if you have any preeclampsia  symptoms. SEEK IMMEDIATE MEDICAL CARE IF:  You have severe abdominal pain.  You have sudden swelling in the hands, ankles, or face.  You gain 4 pounds (1.8 kg) or more in 1 week.  You vomit repeatedly.  You have vaginal bleeding.  You do not feel the baby moving as much.  You have a headache.  You have blurred or double vision.  You have muscle twitching or spasms.  You have shortness of breath.  You have blue fingernails and lips.  You have blood in your urine. MAKE SURE YOU:  Understand these instructions.  Will watch your condition.  Will get help right away if you are not doing well. Document Released: 11/02/2010 Document Revised: 05/09/2011 Document Reviewed: 11/02/2010 [facility] Patient Information 2014 [place], [place].

## 2013-01-15 ENCOUNTER — Telehealth: Payer: Self-pay | Admitting: *Deleted

## 2013-01-15 LAB — WET PREP, GENITAL

## 2013-01-15 MED ORDER — METRONIDAZOLE 500 MG PO TABS: 500.0000 mg | ORAL_TABLET | Freq: Two times a day (BID) | ORAL | Status: AC

## 2013-01-15 NOTE — Addendum Note (Signed)
Addended by: Jaynie Collins A on: 01/15/2013 01:13 PM   Modules accepted: Orders

## 2013-01-15 NOTE — Telephone Encounter (Signed)
01/15/2013  Message in results to call pt and inform her of prescription @ her pharmacy for BV.  Spoke with patient and she verbalizes understanding.

## 2013-01-15 NOTE — Telephone Encounter (Signed)
Message copied by Dorothyann Peng on Tue Jan 15, 2013  1:52 PM ------      Message from: Jaynie Collins A      Created: Tue Jan 15, 2013  1:13 PM       Wet prep showed clue cells consistent with BV, Metronidazole e-prescribed. Please call and tell patient to pick up prescription.       ------

## 2013-01-16 ENCOUNTER — Other Ambulatory Visit: Payer: Self-pay | Admitting: Obstetrics & Gynecology

## 2013-01-16 ENCOUNTER — Ambulatory Visit (HOSPITAL_COMMUNITY)
Admission: RE | Admit: 2013-01-16 | Discharge: 2013-01-16 | Disposition: A | Discharge status: Home / Self Care | Payer: Medicaid Other | Source: Ambulatory Visit | Attending: Obstetrics & Gynecology | Admitting: Obstetrics & Gynecology

## 2013-01-16 VITALS — BP 147/81 | HR 89 | Wt 216.0 lb

## 2013-01-16 DIAGNOSIS — O09299 Supervision of pregnancy with other poor reproductive or obstetric history, unspecified trimester: Secondary | ICD-10-CM

## 2013-01-16 DIAGNOSIS — O10019 Pre-existing essential hypertension complicating pregnancy, unspecified trimester: Secondary | ICD-10-CM | POA: Insufficient documentation

## 2013-01-16 DIAGNOSIS — O0990 Supervision of high risk pregnancy, unspecified, unspecified trimester: Secondary | ICD-10-CM

## 2013-01-16 DIAGNOSIS — O3410 Maternal care for benign tumor of corpus uteri, unspecified trimester: Secondary | ICD-10-CM | POA: Insufficient documentation

## 2013-01-16 DIAGNOSIS — O99210 Obesity complicating pregnancy, unspecified trimester: Secondary | ICD-10-CM

## 2013-01-16 DIAGNOSIS — O10013 Pre-existing essential hypertension complicating pregnancy, third trimester: Secondary | ICD-10-CM

## 2013-01-16 DIAGNOSIS — I10 Essential (primary) hypertension: Secondary | ICD-10-CM

## 2013-01-16 DIAGNOSIS — O2690 Pregnancy related conditions, unspecified, unspecified trimester: Secondary | ICD-10-CM

## 2013-01-17 ENCOUNTER — Encounter: Payer: Self-pay | Admitting: Obstetrics & Gynecology

## 2013-01-28 ENCOUNTER — Telehealth: Payer: Self-pay | Admitting: *Deleted

## 2013-01-28 ENCOUNTER — Ambulatory Visit (INDEPENDENT_AMBULATORY_CARE_PROVIDER_SITE_OTHER): Payer: Medicaid Other | Admitting: Family

## 2013-01-28 VITALS — BP 122/76 | Temp 97.1°F | Wt 212.6 lb

## 2013-01-28 DIAGNOSIS — O0990 Supervision of high risk pregnancy, unspecified, unspecified trimester: Secondary | ICD-10-CM

## 2013-01-28 LAB — POCT URINALYSIS DIP (DEVICE)
Bilirubin Urine: NEGATIVE
Glucose, UA: NEGATIVE mg/dL
Ketones, ur: NEGATIVE mg/dL
Protein, ur: NEGATIVE mg/dL
Specific Gravity, Urine: 1.025 (ref 1.005–1.030)
pH: 6 (ref 5.0–8.0)

## 2013-01-28 LAB — CBC
HCT: 30.3 % — ABNORMAL LOW (ref 36.0–46.0)
Hemoglobin: 9.4 g/dL — ABNORMAL LOW (ref 12.0–15.0)
MCH: 21.2 pg — ABNORMAL LOW (ref 26.0–34.0)
MCHC: 31 g/dL (ref 30.0–36.0)
MCV: 68.4 fL — ABNORMAL LOW (ref 78.0–100.0)
RDW: 16.2 % — ABNORMAL HIGH (ref 11.5–15.5)
WBC: 6.1 10*3/uL (ref 4.0–10.5)

## 2013-01-28 MED ORDER — HYDROCODONE-ACETAMINOPHEN 5-325 MG PO TABS: 1.0000 | ORAL_TABLET | ORAL | Status: DC | PRN
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
## 2013-01-28 NOTE — Addendum Note (Signed)
Addended by: Faythe Casa on: 01/28/2013 02:21 PM   Modules accepted: Orders

## 2013-01-28 NOTE — Telephone Encounter (Deleted)
Message copied by Dorothyann Peng on Mon Jan 28, 2013  4:51 PM ------      Message from: Melissa Noon      Created: Mon Jan 28, 2013 12:39 PM       Please schedule a BPP for this patient this week.  Left without getting it scheduled.   ------

## 2013-01-28 NOTE — Telephone Encounter (Signed)
Message copied by Dorothyann Peng on Mon Jan 28, 2013  4:42 PM ------      Message from: Melissa Noon      Created: Mon Jan 28, 2013 12:39 PM       Please schedule a BPP for this patient this week.  Left without getting it scheduled.   ------

## 2013-01-29 ENCOUNTER — Ambulatory Visit (HOSPITAL_COMMUNITY)
Admission: RE | Admit: 2013-01-29 | Discharge: 2013-01-29 | Disposition: A | Discharge status: Home / Self Care | Payer: Medicaid Other | Source: Ambulatory Visit | Attending: Family | Admitting: Family

## 2013-01-29 ENCOUNTER — Encounter: Payer: Self-pay | Admitting: Family

## 2013-01-29 DIAGNOSIS — O3410 Maternal care for benign tumor of corpus uteri, unspecified trimester: Secondary | ICD-10-CM | POA: Insufficient documentation

## 2013-01-29 DIAGNOSIS — O09299 Supervision of pregnancy with other poor reproductive or obstetric history, unspecified trimester: Secondary | ICD-10-CM | POA: Insufficient documentation

## 2013-01-29 DIAGNOSIS — O10019 Pre-existing essential hypertension complicating pregnancy, unspecified trimester: Secondary | ICD-10-CM | POA: Insufficient documentation

## 2013-01-29 DIAGNOSIS — O0990 Supervision of high risk pregnancy, unspecified, unspecified trimester: Secondary | ICD-10-CM

## 2013-01-29 LAB — RPR

## 2013-01-29 LAB — HIV ANTIBODY (ROUTINE TESTING W REFLEX): HIV: NONREACTIVE

## 2013-01-29 LAB — GLUCOSE TOLERANCE, 1 HOUR (50G) W/O FASTING: Glucose, 1 Hour GTT: 92 mg/dL (ref 70–140)

## 2013-01-29 NOTE — Addendum Note (Signed)
Addended by: Candelaria Stagers E on: 01/29/2013 08:10 AM   Modules accepted: Orders

## 2013-01-29 NOTE — Telephone Encounter (Signed)
Scheduled appt for pt with Radiology.  Spoke with pt regarding appt today at 1400.  Discussed she needs to arrive at 1345.  Pt verbalizes understanding.

## 2013-02-05 ENCOUNTER — Telehealth: Payer: Self-pay | Admitting: General Practice

## 2013-02-05 DIAGNOSIS — IMO0002 Reserved for concepts with insufficient information to code with codable children: Secondary | ICD-10-CM

## 2013-02-05 NOTE — Telephone Encounter (Signed)
Tammy from [facility] called inquiring if Brittney Sandoval still needed a consultation with Dr Fredia Sorrow for drainage of a pelvic cyst. They recommend draining this around 35-36 weeks if indicated. In basket message sent to Dr Macon Large asking about this. The contact number for Tammy at [facility] is [phone].

## 2013-02-06 ENCOUNTER — Ambulatory Visit (HOSPITAL_COMMUNITY): Admission: RE | Admit: 2013-02-06 | Payer: Medicaid Other | Source: Ambulatory Visit

## 2013-02-06 NOTE — Telephone Encounter (Signed)
Dr Macon Large does want consultation and drainage set up. Called tammy from [facility] and informed her. Tammy will contact patient directly for this appointment

## 2013-02-11 ENCOUNTER — Ambulatory Visit (INDEPENDENT_AMBULATORY_CARE_PROVIDER_SITE_OTHER): Payer: Medicaid Other | Admitting: Family Medicine

## 2013-02-11 ENCOUNTER — Telehealth: Payer: Self-pay | Admitting: Radiology

## 2013-02-11 VITALS — BP 124/75 | Temp 97.5°F | Wt 215.5 lb

## 2013-02-11 DIAGNOSIS — O10013 Pre-existing essential hypertension complicating pregnancy, third trimester: Secondary | ICD-10-CM

## 2013-02-11 DIAGNOSIS — O10019 Pre-existing essential hypertension complicating pregnancy, unspecified trimester: Secondary | ICD-10-CM

## 2013-02-11 LAB — POCT URINALYSIS DIP (DEVICE)
Bilirubin Urine: NEGATIVE
Glucose, UA: NEGATIVE mg/dL
Leukocytes, UA: NEGATIVE
Nitrite: NEGATIVE
Urobilinogen, UA: 0.2 mg/dL (ref 0.0–1.0)

## 2013-02-11 NOTE — Telephone Encounter (Signed)
Reminder call re: app't on 02/12/2013 at 7:45 am/arrive at 7:30 am.    [name], RN 02/11/2013 2:58 PM

## 2013-02-11 NOTE — Progress Notes (Signed)
P=79 

## 2013-02-11 NOTE — Progress Notes (Signed)
For U/s for growth on Wed. With MFM Begin 2x/wk testing for CHTN--NST reviewed and reactive. For interventional radiology to see in consult tomorrow. Considering Nexplanon--has had IUD in the past.

## 2013-02-11 NOTE — Patient Instructions (Signed)
Third Trimester of Pregnancy The third trimester is from week 29 through week 42, months 7 through 9. The third trimester is a time when the fetus is growing rapidly. At the end of the ninth month, the fetus is about 20 inches in length and weighs 6 10 pounds.  BODY CHANGES Your body goes through many changes during pregnancy. The changes vary from woman to woman.   Your weight will continue to increase. You can expect to gain 25 35 pounds (11 16 kg) by the end of the pregnancy.  You may begin to get stretch marks on your hips, abdomen, and breasts.  You may urinate more often because the fetus is moving lower into your pelvis and pressing on your bladder.  You may develop or continue to have heartburn as a result of your pregnancy.  You may develop constipation because certain hormones are causing the muscles that push waste through your intestines to slow down.  You may develop hemorrhoids or swollen, bulging veins (varicose veins).  You may have pelvic pain because of the weight gain and pregnancy hormones relaxing your joints between the bones in your pelvis. Back aches may result from over exertion of the muscles supporting your posture.  Your breasts will continue to grow and be tender. A yellow discharge may leak from your breasts called colostrum.  Your belly button may stick out.  You may feel short of breath because of your expanding uterus.  You may notice the fetus "dropping," or moving lower in your abdomen.  You may have a bloody mucus discharge. This usually occurs a few days to a week before labor begins.  Your cervix becomes thin and soft (effaced) near your due date. WHAT TO EXPECT AT YOUR PRENATAL EXAMS  You will have prenatal exams every 2 weeks until week 36. Then, you will have weekly prenatal exams. During a routine prenatal visit:  You will be weighed to make sure you and the fetus are growing normally.  Your blood pressure is taken.  Your abdomen will  be measured to track your baby's growth.  The fetal heartbeat will be listened to.  Any test results from the previous visit will be discussed.  You may have a cervical check near your due date to see if you have effaced. At around 36 weeks, your caregiver will check your cervix. At the same time, your caregiver will also perform a test on the secretions of the vaginal tissue. This test is to determine if a type of bacteria, Group B streptococcus, is present. Your caregiver will explain this further. Your caregiver may ask you:  What your birth plan is.  How you are feeling.  If you are feeling the baby move.  If you have had any abnormal symptoms, such as leaking fluid, bleeding, severe headaches, or abdominal cramping.  If you have any questions. Other tests or screenings that may be performed during your third trimester include:  Blood tests that check for low iron levels (anemia).  Fetal testing to check the health, activity level, and growth of the fetus. Testing is done if you have certain medical conditions or if there are problems during the pregnancy. FALSE LABOR You may feel small, irregular contractions that eventually go away. These are called Braxton Hicks contractions, or false labor. Contractions may last for hours, days, or even weeks before true labor sets in. If contractions come at regular intervals, intensify, or become painful, it is best to be seen by your caregiver.    SIGNS OF LABOR   Menstrual-like cramps.  Contractions that are 5 minutes apart or less.  Contractions that start on the top of the uterus and spread down to the lower abdomen and back.  A sense of increased pelvic pressure or back pain.  A watery or bloody mucus discharge that comes from the vagina. If you have any of these signs before the 37th week of pregnancy, call your caregiver right away. You need to go to the hospital to get checked immediately. HOME CARE INSTRUCTIONS   Avoid all  smoking, herbs, alcohol, and unprescribed drugs. These chemicals affect the formation and growth of the baby.  Follow your caregiver's instructions regarding medicine use. There are medicines that are either safe or unsafe to take during pregnancy.  Exercise only as directed by your caregiver. Experiencing uterine cramps is a good sign to stop exercising.  Continue to eat regular, healthy meals.  Wear a good support bra for breast tenderness.  Do not use hot tubs, steam rooms, or saunas.  Wear your seat belt at all times when driving.  Avoid raw meat, uncooked cheese, cat litter boxes, and soil used by cats. These carry germs that can cause birth defects in the baby.  Take your prenatal vitamins.  Try taking a stool softener (if your caregiver approves) if you develop constipation. Eat more high-fiber foods, such as fresh vegetables or fruit and whole grains. Drink plenty of fluids to keep your urine clear or pale yellow.  Take warm sitz baths to soothe any pain or discomfort caused by hemorrhoids. Use hemorrhoid cream if your caregiver approves.  If you develop varicose veins, wear support hose. Elevate your feet for 15 minutes, 3 4 times a day. Limit salt in your diet.  Avoid heavy lifting, wear low heal shoes, and practice good posture.  Rest a lot with your legs elevated if you have leg cramps or low back pain.  Visit your dentist if you have not gone during your pregnancy. Use a soft toothbrush to brush your teeth and be gentle when you floss.  A sexual relationship may be continued unless your caregiver directs you otherwise.  Do not travel far distances unless it is absolutely necessary and only with the approval of your caregiver.  Take prenatal classes to understand, practice, and ask questions about the labor and delivery.  Make a trial run to the hospital.  Pack your hospital bag.  Prepare the baby's nursery.  Continue to go to all your prenatal visits as directed  by your caregiver. SEEK MEDICAL CARE IF:  You are unsure if you are in labor or if your water has broken.  You have dizziness.  You have mild pelvic cramps, pelvic pressure, or nagging pain in your abdominal area.  You have persistent nausea, vomiting, or diarrhea.  You have a bad smelling vaginal discharge.  You have pain with urination. SEEK IMMEDIATE MEDICAL CARE IF:   You have a fever.  You are leaking fluid from your vagina.  You have spotting or bleeding from your vagina.  You have severe abdominal cramping or pain.  You have rapid weight loss or gain.  You have shortness of breath with chest pain.  You notice sudden or extreme swelling of your face, hands, ankles, feet, or legs.  You have not felt your baby move in over an hour.  You have severe headaches that do not go away with medicine.  You have vision changes. Document Released: 02/08/2001 Document Revised: 10/17/2012 Document Reviewed:   04/17/2012 [employer] Patient Information 2014 [employer].  Breastfeeding Deciding to breastfeed is one of the best choices you can make for you and your baby. A change in hormones during pregnancy causes your breast tissue to grow and increases the number and size of your milk ducts. These hormones also allow proteins, sugars, and fats from your blood supply to make breast milk in your milk-producing glands. Hormones prevent breast milk from being released before your baby is born as well as prompt milk flow after birth. Once breastfeeding has begun, thoughts of your baby, as well as his or her sucking or crying, can stimulate the release of milk from your milk-producing glands.  BENEFITS OF BREASTFEEDING For Your Baby  Your first milk (colostrum) helps your baby's digestive system function better.   There are antibodies in your milk that help your baby fight off infections.   Your baby has a lower incidence of asthma, allergies, and sudden infant death syndrome.    The nutrients in breast milk are better for your baby than infant formulas and are designed uniquely for your baby's needs.   Breast milk improves your baby's brain development.   Your baby is less likely to develop other conditions, such as childhood obesity, asthma, or type 2 diabetes mellitus.  For You   Breastfeeding helps to create a very special bond between you and your baby.   Breastfeeding is convenient. Breast milk is always available at the correct temperature and costs nothing.   Breastfeeding helps to burn calories and helps you lose the weight gained during pregnancy.   Breastfeeding makes your uterus contract to its prepregnancy size faster and slows bleeding (lochia) after you give birth.   Breastfeeding helps to lower your risk of developing type 2 diabetes mellitus, osteoporosis, and breast or ovarian cancer later in life. SIGNS THAT YOUR BABY IS HUNGRY Early Signs of Hunger  Increased alertness or activity.  Stretching.  Movement of the head from side to side.  Movement of the head and opening of the mouth when the corner of the mouth or cheek is stroked (rooting).  Increased sucking sounds, smacking lips, cooing, sighing, or squeaking.  Hand-to-mouth movements.  Increased sucking of fingers or hands. Late Signs of Hunger  Fussing.  Intermittent crying. Extreme Signs of Hunger Signs of extreme hunger will require calming and consoling before your baby will be able to breastfeed successfully. Do not wait for the following signs of extreme hunger to occur before you initiate breastfeeding:   Restlessness.  A loud, strong cry.   Screaming. BREASTFEEDING BASICS Breastfeeding Initiation  Find a comfortable place to sit or lie down, with your neck and back well supported.  Place a pillow or rolled up blanket under your baby to bring him or her to the level of your breast (if you are seated). Nursing pillows are specially designed to help  support your arms and your baby while you breastfeed.  Make sure that your baby's abdomen is facing your abdomen.   Gently massage your breast. With your fingertips, massage from your chest wall toward your nipple in a circular motion. This encourages milk flow. You may need to continue this action during the feeding if your milk flows slowly.  Support your breast with 4 fingers underneath and your thumb above your nipple. Make sure your fingers are well away from your nipple and your baby's mouth.   Stroke your baby's lips gently with your finger or nipple.   When your baby's mouth is   open wide enough, quickly bring your baby to your breast, placing your entire nipple and as much of the colored area around your nipple (areola) as possible into your baby's mouth.   More areola should be visible above your baby's upper lip than below the lower lip.   Your baby's tongue should be between his or her lower gum and your breast.   Ensure that your baby's mouth is correctly positioned around your nipple (latched). Your baby's lips should create a seal on your breast and be turned out (everted).  It is common for your baby to suck about 2 3 minutes in order to start the flow of breast milk. Latching Teaching your baby how to latch on to your breast properly is very important. An improper latch can cause nipple pain and decreased milk supply for you and poor weight gain in your baby. Also, if your baby is not latched onto your nipple properly, he or she may swallow some air during feeding. This can make your baby fussy. Burping your baby when you switch breasts during the feeding can help to get rid of the air. However, teaching your baby to latch on properly is still the best way to prevent fussiness from swallowing air while breastfeeding. Signs that your baby has successfully latched on to your nipple:    Silent tugging or silent sucking, without causing you pain.   Swallowing heard  between every 3 4 sucks.    Muscle movement above and in front of his or her ears while sucking.  Signs that your baby has not successfully latched on to nipple:   Sucking sounds or smacking sounds from your baby while breastfeeding.  Nipple pain. If you think your baby has not latched on correctly, slip your finger into the corner of your baby's mouth to break the suction and place it between your baby's gums. Attempt breastfeeding initiation again. Signs of Successful Breastfeeding Signs from your baby:   A gradual decrease in the number of sucks or complete cessation of sucking.   Falling asleep.   Relaxation of his or her body.   Retention of a small amount of milk in his or her mouth.   Letting go of your breast by himself or herself. Signs from you:  Breasts that have increased in firmness, weight, and size 1 3 hours after feeding.   Breasts that are softer immediately after breastfeeding.  Increased milk volume, as well as a change in milk consistency and color by the 5th day of breastfeeding.   Nipples that are not sore, cracked, or bleeding. Signs That Your Baby is Getting Enough Milk  Wetting at least 3 diapers in a 24-hour period. The urine should be clear and pale yellow by age 5 days.  At least 3 stools in a 24-hour period by age 5 days. The stool should be soft and yellow.  At least 3 stools in a 24-hour period by age 7 days. The stool should be seedy and yellow.  No loss of weight greater than 10% of birth weight during the first 3 days of age.  Average weight gain of 4 7 ounces (120 210 mL) per week after age 4 days.  Consistent daily weight gain by age 5 days, without weight loss after the age of 2 weeks. After a feeding, your baby may spit up a small amount. This is common. BREASTFEEDING FREQUENCY AND DURATION Frequent feeding will help you make more milk and can prevent sore nipples and breast engorgement.   Breastfeed when you feel the need to  reduce the fullness of your breasts or when your baby shows signs of hunger. This is called "breastfeeding on demand." Avoid introducing a pacifier to your baby while you are working to establish breastfeeding (the first 4 6 weeks after your baby is born). After this time you may choose to use a pacifier. Research has shown that pacifier use during the first year of a baby's life decreases the risk of sudden infant death syndrome (SIDS). Allow your baby to feed on each breast as long as he or she wants. Breastfeed until your baby is finished feeding. When your baby unlatches or falls asleep while feeding from the first breast, offer the second breast. Because newborns are often sleepy in the first few weeks of life, you may need to awaken your baby to get him or her to feed. Breastfeeding times will vary from baby to baby. However, the following rules can serve as a guide to help you ensure that your baby is properly fed:  Newborns (babies 4 weeks of age or younger) may breastfeed every 1 3 hours.  Newborns should not go longer than 3 hours during the day or 5 hours during the night without breastfeeding.  You should breastfeed your baby a minimum of 8 times in a 24-hour period until you begin to introduce solid foods to your baby at around 6 months of age. BREAST MILK PUMPING Pumping and storing breast milk allows you to ensure that your baby is exclusively fed your breast milk, even at times when you are unable to breastfeed. This is especially important if you are going back to work while you are still breastfeeding or when you are not able to be present during feedings. Your lactation consultant can give you guidelines on how long it is safe to store breast milk.  A breast pump is a machine that allows you to pump milk from your breast into a sterile bottle. The pumped breast milk can then be stored in a refrigerator or freezer. Some breast pumps are operated by hand, while others use electricity. Ask  your lactation consultant which type will work best for you. Breast pumps can be purchased, but some hospitals and breastfeeding support groups lease breast pumps on a monthly basis. A lactation consultant can teach you how to hand express breast milk, if you prefer not to use a pump.  CARING FOR YOUR BREASTS WHILE YOU BREASTFEED Nipples can become dry, cracked, and sore while breastfeeding. The following recommendations can help keep your breasts moisturized and healthy:  Avoid using soap on your nipples.   Wear a supportive bra. Although not required, special nursing bras and tank tops are designed to allow access to your breasts for breastfeeding without taking off your entire bra or top. Avoid wearing underwire style bras or extremely tight bras.  Air dry your nipples for 3 4minutes after each feeding.   Use only cotton bra pads to absorb leaked breast milk. Leaking of breast milk between feedings is normal.   Use lanolin on your nipples after breastfeeding. Lanolin helps to maintain your skin's normal moisture barrier. If you use pure lanolin you do not need to wash it off before feeding your baby again. Pure lanolin is not toxic to your baby. You may also hand express a few drops of breast milk and gently massage that milk into your nipples and allow the milk to air dry. In the first few weeks after giving birth, some women   experience extremely full breasts (engorgement). Engorgement can make your breasts feel heavy, warm, and tender to the touch. Engorgement peaks within 3 5 days after you give birth. The following recommendations can help ease engorgement:  Completely empty your breasts while breastfeeding or pumping. You may want to start by applying warm, moist heat (in the shower or with warm water-soaked hand towels) just before feeding or pumping. This increases circulation and helps the milk flow. If your baby does not completely empty your breasts while breastfeeding, pump any extra  milk after he or she is finished.  Wear a snug bra (nursing or regular) or tank top for 1 2 days to signal your body to slightly decrease milk production.  Apply ice packs to your breasts, unless this is too uncomfortable for you.  Make sure that your baby is latched on and positioned properly while breastfeeding. If engorgement persists after 48 hours of following these recommendations, contact your health care provider or a lactation consultant. OVERALL HEALTH CARE RECOMMENDATIONS WHILE BREASTFEEDING  Eat healthy foods. Alternate between meals and snacks, eating 3 of each per day. Because what you eat affects your breast milk, some of the foods may make your baby more irritable than usual. Avoid eating these foods if you are sure that they are negatively affecting your baby.  Drink milk, fruit juice, and water to satisfy your thirst (about 10 glasses a day).   Rest often, relax, and continue to take your prenatal vitamins to prevent fatigue, stress, and anemia.  Continue breast self-awareness checks.  Avoid chewing and smoking tobacco.  Avoid alcohol and drug use. Some medicines that may be harmful to your baby can pass through breast milk. It is important to ask your health care provider before taking any medicine, including all over-the-counter and prescription medicine as well as vitamin and herbal supplements. It is possible to become pregnant while breastfeeding. If birth control is desired, ask your health care provider about options that will be safe for your baby. SEEK MEDICAL CARE IF:   You feel like you want to stop breastfeeding or have become frustrated with breastfeeding.  You have painful breasts or nipples.  Your nipples are cracked or bleeding.  Your breasts are red, tender, or warm.  You have a swollen area on either breast.  You have a fever or chills.  You have nausea or vomiting.  You have drainage other than breast milk from your nipples.  Your breasts  do not become full before feedings by the 5th day after you give birth.  You feel sad and depressed.  Your baby is too sleepy to eat well.  Your baby is having trouble sleeping.   Your baby is wetting less than 3 diapers in a 24-hour period.  Your baby has less than 3 stools in a 24-hour period.  Your baby's skin or the white part of his or her eyes becomes yellow.   Your baby is not gaining weight by 5 days of age. SEEK IMMEDIATE MEDICAL CARE IF:   Your baby is overly tired (lethargic) and does not want to wake up and feed.  Your baby develops an unexplained fever. Document Released: 02/14/2005 Document Revised: 10/17/2012 Document Reviewed: 08/08/2012 [employer] Patient Information 2014 [employer].  

## 2013-02-12 ENCOUNTER — Ambulatory Visit
Admission: RE | Admit: 2013-02-12 | Discharge: 2013-02-12 | Disposition: A | Discharge status: Home / Self Care | Payer: Medicaid Other | Source: Ambulatory Visit | Attending: Obstetrics & Gynecology | Admitting: Obstetrics & Gynecology

## 2013-02-12 VITALS — BP 116/75 | HR 77 | Temp 97.9°F | Resp 16 | Ht 64.0 in | Wt 215.0 lb

## 2013-02-12 DIAGNOSIS — IMO0002 Reserved for concepts with insufficient information to code with codable children: Secondary | ICD-10-CM

## 2013-02-13 ENCOUNTER — Inpatient Hospital Stay (HOSPITAL_COMMUNITY): Admission: RE | Admit: 2013-02-13 | Payer: Medicaid Other | Source: Ambulatory Visit

## 2013-02-13 ENCOUNTER — Other Ambulatory Visit: Payer: Self-pay | Admitting: Obstetrics & Gynecology

## 2013-02-13 ENCOUNTER — Ambulatory Visit (HOSPITAL_COMMUNITY)
Admission: RE | Admit: 2013-02-13 | Discharge: 2013-02-13 | Disposition: A | Discharge status: Home / Self Care | Payer: Medicaid Other | Source: Ambulatory Visit | Attending: Obstetrics & Gynecology | Admitting: Obstetrics & Gynecology

## 2013-02-13 VITALS — BP 132/85 | HR 79 | Wt 218.0 lb

## 2013-02-13 DIAGNOSIS — O09299 Supervision of pregnancy with other poor reproductive or obstetric history, unspecified trimester: Secondary | ICD-10-CM

## 2013-02-13 DIAGNOSIS — O2690 Pregnancy related conditions, unspecified, unspecified trimester: Secondary | ICD-10-CM

## 2013-02-13 DIAGNOSIS — O10019 Pre-existing essential hypertension complicating pregnancy, unspecified trimester: Secondary | ICD-10-CM | POA: Insufficient documentation

## 2013-02-13 DIAGNOSIS — O10013 Pre-existing essential hypertension complicating pregnancy, third trimester: Secondary | ICD-10-CM

## 2013-02-13 DIAGNOSIS — O0990 Supervision of high risk pregnancy, unspecified, unspecified trimester: Secondary | ICD-10-CM

## 2013-02-13 DIAGNOSIS — O3410 Maternal care for benign tumor of corpus uteri, unspecified trimester: Secondary | ICD-10-CM | POA: Insufficient documentation

## 2013-02-18 ENCOUNTER — Ambulatory Visit (INDEPENDENT_AMBULATORY_CARE_PROVIDER_SITE_OTHER): Payer: Medicaid Other | Admitting: Obstetrics & Gynecology

## 2013-02-18 VITALS — BP 131/70 | Wt 217.1 lb

## 2013-02-18 DIAGNOSIS — O10019 Pre-existing essential hypertension complicating pregnancy, unspecified trimester: Secondary | ICD-10-CM

## 2013-02-18 DIAGNOSIS — R1900 Intra-abdominal and pelvic swelling, mass and lump, unspecified site: Secondary | ICD-10-CM

## 2013-02-18 DIAGNOSIS — O10013 Pre-existing essential hypertension complicating pregnancy, third trimester: Secondary | ICD-10-CM

## 2013-02-18 LAB — POCT URINALYSIS DIP (DEVICE)
Bilirubin Urine: NEGATIVE
Glucose, UA: NEGATIVE mg/dL
Ketones, ur: NEGATIVE mg/dL
Nitrite: NEGATIVE
Protein, ur: NEGATIVE mg/dL
Specific Gravity, Urine: 1.02 (ref 1.005–1.030)

## 2013-02-18 NOTE — Progress Notes (Signed)
Patient had IR evaluation on 02/12/13, she will be scheduled for drainage around 36 weeks 02/13/13 scan at [facility]FM showed EFW 1514g (24%), normal AFI, AC at 6%, normal dopplers.  Will have another scan in 3 weeks. NST performed today was reviewed and was found to be reactive.  Continue recommended antenatal testing for [facility] and prenatal care. Stable BP, preeclampsia precautions reviewed No other complaints or concerns.  Fetal movement and labor precautions reviewed.

## 2013-02-18 NOTE — Progress Notes (Signed)
P = 83    [place] for growth done 12/17 @ [facility], also scheduled for follow up growth again on 03/06/13 due to [facility] at 6%

## 2013-02-18 NOTE — Patient Instructions (Signed)
Return to clinic for any obstetric concerns or go to MAU for evaluation  

## 2013-02-20 ENCOUNTER — Ambulatory Visit (INDEPENDENT_AMBULATORY_CARE_PROVIDER_SITE_OTHER): Payer: Medicaid Other | Admitting: *Deleted

## 2013-02-20 VITALS — BP 112/69

## 2013-02-20 DIAGNOSIS — O10013 Pre-existing essential hypertension complicating pregnancy, third trimester: Secondary | ICD-10-CM

## 2013-02-20 DIAGNOSIS — O10019 Pre-existing essential hypertension complicating pregnancy, unspecified trimester: Secondary | ICD-10-CM

## 2013-02-20 NOTE — Progress Notes (Signed)
P= 86  

## 2013-02-20 NOTE — Progress Notes (Signed)
NST reviewed and reactive.  Kaelea Gathright L. Harraway-Smith, M.D., FACOG    

## 2013-02-25 ENCOUNTER — Telehealth: Payer: Self-pay | Admitting: Emergency Medicine

## 2013-02-25 ENCOUNTER — Ambulatory Visit (INDEPENDENT_AMBULATORY_CARE_PROVIDER_SITE_OTHER): Payer: Medicaid Other | Admitting: Family Medicine

## 2013-02-25 VITALS — BP 129/76 | Temp 98.8°F | Wt 219.2 lb

## 2013-02-25 DIAGNOSIS — O10019 Pre-existing essential hypertension complicating pregnancy, unspecified trimester: Secondary | ICD-10-CM

## 2013-02-25 DIAGNOSIS — O10013 Pre-existing essential hypertension complicating pregnancy, third trimester: Secondary | ICD-10-CM

## 2013-02-25 LAB — POCT URINALYSIS DIP (DEVICE)
Bilirubin Urine: NEGATIVE
Glucose, UA: NEGATIVE mg/dL
Ketones, ur: NEGATIVE mg/dL
Protein, ur: NEGATIVE mg/dL
Urobilinogen, UA: 1 mg/dL (ref 0.0–1.0)

## 2013-02-25 NOTE — Progress Notes (Signed)
P-82 

## 2013-02-25 NOTE — Telephone Encounter (Signed)
LMOVM FOR Brittney Sandoval AT [facility] TO SET PT UP AT [facility] OR [facility] FOR PELVIC CYST ASPIRATION W/ DR GY.  NEEDS TO BE BETWEEN 35-36 WEEKS OF HER PREGNANCY.   PT WANTS WHOMEVER TO CALL HER CELL # , THE OTHER # IS NOT WORKING RIGHT NOW.

## 2013-02-25 NOTE — Patient Instructions (Signed)
Third Trimester of Pregnancy The third trimester is from week 29 through week 42, months 7 through 9. The third trimester is a time when the fetus is growing rapidly. At the end of the ninth month, the fetus is about 20 inches in length and weighs 6 10 pounds.  BODY CHANGES Your body goes through many changes during pregnancy. The changes vary from woman to woman.   Your weight will continue to increase. You can expect to gain 25 35 pounds (11 16 kg) by the end of the pregnancy.  You may begin to get stretch marks on your hips, abdomen, and breasts.  You may urinate more often because the fetus is moving lower into your pelvis and pressing on your bladder.  You may develop or continue to have heartburn as a result of your pregnancy.  You may develop constipation because certain hormones are causing the muscles that push waste through your intestines to slow down.  You may develop hemorrhoids or swollen, bulging veins (varicose veins).  You may have pelvic pain because of the weight gain and pregnancy hormones relaxing your joints between the bones in your pelvis. Back aches may result from over exertion of the muscles supporting your posture.  Your breasts will continue to grow and be tender. A yellow discharge may leak from your breasts called colostrum.  Your belly button may stick out.  You may feel short of breath because of your expanding uterus.  You may notice the fetus "dropping," or moving lower in your abdomen.  You may have a bloody mucus discharge. This usually occurs a few days to a week before labor begins.  Your cervix becomes thin and soft (effaced) near your due date. WHAT TO EXPECT AT YOUR PRENATAL EXAMS  You will have prenatal exams every 2 weeks until week 36. Then, you will have weekly prenatal exams. During a routine prenatal visit:  You will be weighed to make sure you and the fetus are growing normally.  Your blood pressure is taken.  Your abdomen will  be measured to track your baby's growth.  The fetal heartbeat will be listened to.  Any test results from the previous visit will be discussed.  You may have a cervical check near your due date to see if you have effaced. At around 36 weeks, your caregiver will check your cervix. At the same time, your caregiver will also perform a test on the secretions of the vaginal tissue. This test is to determine if a type of bacteria, Group B streptococcus, is present. Your caregiver will explain this further. Your caregiver may ask you:  What your birth plan is.  How you are feeling.  If you are feeling the baby move.  If you have had any abnormal symptoms, such as leaking fluid, bleeding, severe headaches, or abdominal cramping.  If you have any questions. Other tests or screenings that may be performed during your third trimester include:  Blood tests that check for low iron levels (anemia).  Fetal testing to check the health, activity level, and growth of the fetus. Testing is done if you have certain medical conditions or if there are problems during the pregnancy. FALSE LABOR You may feel small, irregular contractions that eventually go away. These are called Braxton Hicks contractions, or false labor. Contractions may last for hours, days, or even weeks before true labor sets in. If contractions come at regular intervals, intensify, or become painful, it is best to be seen by your caregiver.    SIGNS OF LABOR   Menstrual-like cramps.  Contractions that are 5 minutes apart or less.  Contractions that start on the top of the uterus and spread down to the lower abdomen and back.  A sense of increased pelvic pressure or back pain.  A watery or bloody mucus discharge that comes from the vagina. If you have any of these signs before the 37th week of pregnancy, call your caregiver right away. You need to go to the hospital to get checked immediately. HOME CARE INSTRUCTIONS   Avoid all  smoking, herbs, alcohol, and unprescribed drugs. These chemicals affect the formation and growth of the baby.  Follow your caregiver's instructions regarding medicine use. There are medicines that are either safe or unsafe to take during pregnancy.  Exercise only as directed by your caregiver. Experiencing uterine cramps is a good sign to stop exercising.  Continue to eat regular, healthy meals.  Wear a good support bra for breast tenderness.  Do not use hot tubs, steam rooms, or saunas.  Wear your seat belt at all times when driving.  Avoid raw meat, uncooked cheese, cat litter boxes, and soil used by cats. These carry germs that can cause birth defects in the baby.  Take your prenatal vitamins.  Try taking a stool softener (if your caregiver approves) if you develop constipation. Eat more high-fiber foods, such as fresh vegetables or fruit and whole grains. Drink plenty of fluids to keep your urine clear or pale yellow.  Take warm sitz baths to soothe any pain or discomfort caused by hemorrhoids. Use hemorrhoid cream if your caregiver approves.  If you develop varicose veins, wear support hose. Elevate your feet for 15 minutes, 3 4 times a day. Limit salt in your diet.  Avoid heavy lifting, wear low heal shoes, and practice good posture.  Rest a lot with your legs elevated if you have leg cramps or low back pain.  Visit your dentist if you have not gone during your pregnancy. Use a soft toothbrush to brush your teeth and be gentle when you floss.  A sexual relationship may be continued unless your caregiver directs you otherwise.  Do not travel far distances unless it is absolutely necessary and only with the approval of your caregiver.  Take prenatal classes to understand, practice, and ask questions about the labor and delivery.  Make a trial run to the hospital.  Pack your hospital bag.  Prepare the baby's nursery.  Continue to go to all your prenatal visits as directed  by your caregiver. SEEK MEDICAL CARE IF:  You are unsure if you are in labor or if your water has broken.  You have dizziness.  You have mild pelvic cramps, pelvic pressure, or nagging pain in your abdominal area.  You have persistent nausea, vomiting, or diarrhea.  You have a bad smelling vaginal discharge.  You have pain with urination. SEEK IMMEDIATE MEDICAL CARE IF:   You have a fever.  You are leaking fluid from your vagina.  You have spotting or bleeding from your vagina.  You have severe abdominal cramping or pain.  You have rapid weight loss or gain.  You have shortness of breath with chest pain.  You notice sudden or extreme swelling of your face, hands, ankles, feet, or legs.  You have not felt your baby move in over an hour.  You have severe headaches that do not go away with medicine.  You have vision changes. Document Released: 02/08/2001 Document Revised: 10/17/2012 Document Reviewed:   04/17/2012 [employer] Patient Information 2014 [employer].  Breastfeeding Deciding to breastfeed is one of the best choices you can make for you and your baby. A change in hormones during pregnancy causes your breast tissue to grow and increases the number and size of your milk ducts. These hormones also allow proteins, sugars, and fats from your blood supply to make breast milk in your milk-producing glands. Hormones prevent breast milk from being released before your baby is born as well as prompt milk flow after birth. Once breastfeeding has begun, thoughts of your baby, as well as his or her sucking or crying, can stimulate the release of milk from your milk-producing glands.  BENEFITS OF BREASTFEEDING For Your Baby  Your first milk (colostrum) helps your baby's digestive system function better.   There are antibodies in your milk that help your baby fight off infections.   Your baby has a lower incidence of asthma, allergies, and sudden infant death syndrome.    The nutrients in breast milk are better for your baby than infant formulas and are designed uniquely for your baby's needs.   Breast milk improves your baby's brain development.   Your baby is less likely to develop other conditions, such as childhood obesity, asthma, or type 2 diabetes mellitus.  For You   Breastfeeding helps to create a very special bond between you and your baby.   Breastfeeding is convenient. Breast milk is always available at the correct temperature and costs nothing.   Breastfeeding helps to burn calories and helps you lose the weight gained during pregnancy.   Breastfeeding makes your uterus contract to its prepregnancy size faster and slows bleeding (lochia) after you give birth.   Breastfeeding helps to lower your risk of developing type 2 diabetes mellitus, osteoporosis, and breast or ovarian cancer later in life. SIGNS THAT YOUR BABY IS HUNGRY Early Signs of Hunger  Increased alertness or activity.  Stretching.  Movement of the head from side to side.  Movement of the head and opening of the mouth when the corner of the mouth or cheek is stroked (rooting).  Increased sucking sounds, smacking lips, cooing, sighing, or squeaking.  Hand-to-mouth movements.  Increased sucking of fingers or hands. Late Signs of Hunger  Fussing.  Intermittent crying. Extreme Signs of Hunger Signs of extreme hunger will require calming and consoling before your baby will be able to breastfeed successfully. Do not wait for the following signs of extreme hunger to occur before you initiate breastfeeding:   Restlessness.  A loud, strong cry.   Screaming. BREASTFEEDING BASICS Breastfeeding Initiation  Find a comfortable place to sit or lie down, with your neck and back well supported.  Place a pillow or rolled up blanket under your baby to bring him or her to the level of your breast (if you are seated). Nursing pillows are specially designed to help  support your arms and your baby while you breastfeed.  Make sure that your baby's abdomen is facing your abdomen.   Gently massage your breast. With your fingertips, massage from your chest wall toward your nipple in a circular motion. This encourages milk flow. You may need to continue this action during the feeding if your milk flows slowly.  Support your breast with 4 fingers underneath and your thumb above your nipple. Make sure your fingers are well away from your nipple and your baby's mouth.   Stroke your baby's lips gently with your finger or nipple.   When your baby's mouth is   open wide enough, quickly bring your baby to your breast, placing your entire nipple and as much of the colored area around your nipple (areola) as possible into your baby's mouth.   More areola should be visible above your baby's upper lip than below the lower lip.   Your baby's tongue should be between his or her lower gum and your breast.   Ensure that your baby's mouth is correctly positioned around your nipple (latched). Your baby's lips should create a seal on your breast and be turned out (everted).  It is common for your baby to suck about 2 3 minutes in order to start the flow of breast milk. Latching Teaching your baby how to latch on to your breast properly is very important. An improper latch can cause nipple pain and decreased milk supply for you and poor weight gain in your baby. Also, if your baby is not latched onto your nipple properly, he or she may swallow some air during feeding. This can make your baby fussy. Burping your baby when you switch breasts during the feeding can help to get rid of the air. However, teaching your baby to latch on properly is still the best way to prevent fussiness from swallowing air while breastfeeding. Signs that your baby has successfully latched on to your nipple:    Silent tugging or silent sucking, without causing you pain.   Swallowing heard  between every 3 4 sucks.    Muscle movement above and in front of his or her ears while sucking.  Signs that your baby has not successfully latched on to nipple:   Sucking sounds or smacking sounds from your baby while breastfeeding.  Nipple pain. If you think your baby has not latched on correctly, slip your finger into the corner of your baby's mouth to break the suction and place it between your baby's gums. Attempt breastfeeding initiation again. Signs of Successful Breastfeeding Signs from your baby:   A gradual decrease in the number of sucks or complete cessation of sucking.   Falling asleep.   Relaxation of his or her body.   Retention of a small amount of milk in his or her mouth.   Letting go of your breast by himself or herself. Signs from you:  Breasts that have increased in firmness, weight, and size 1 3 hours after feeding.   Breasts that are softer immediately after breastfeeding.  Increased milk volume, as well as a change in milk consistency and color by the 5th day of breastfeeding.   Nipples that are not sore, cracked, or bleeding. Signs That Your Baby is Getting Enough Milk  Wetting at least 3 diapers in a 24-hour period. The urine should be clear and pale yellow by age 5 days.  At least 3 stools in a 24-hour period by age 5 days. The stool should be soft and yellow.  At least 3 stools in a 24-hour period by age 7 days. The stool should be seedy and yellow.  No loss of weight greater than 10% of birth weight during the first 3 days of age.  Average weight gain of 4 7 ounces (120 210 mL) per week after age 4 days.  Consistent daily weight gain by age 5 days, without weight loss after the age of 2 weeks. After a feeding, your baby may spit up a small amount. This is common. BREASTFEEDING FREQUENCY AND DURATION Frequent feeding will help you make more milk and can prevent sore nipples and breast engorgement.   Breastfeed when you feel the need to  reduce the fullness of your breasts or when your baby shows signs of hunger. This is called "breastfeeding on demand." Avoid introducing a pacifier to your baby while you are working to establish breastfeeding (the first 4 6 weeks after your baby is born). After this time you may choose to use a pacifier. Research has shown that pacifier use during the first year of a baby's life decreases the risk of sudden infant death syndrome (SIDS). Allow your baby to feed on each breast as long as he or she wants. Breastfeed until your baby is finished feeding. When your baby unlatches or falls asleep while feeding from the first breast, offer the second breast. Because newborns are often sleepy in the first few weeks of life, you may need to awaken your baby to get him or her to feed. Breastfeeding times will vary from baby to baby. However, the following rules can serve as a guide to help you ensure that your baby is properly fed:  Newborns (babies 4 weeks of age or younger) may breastfeed every 1 3 hours.  Newborns should not go longer than 3 hours during the day or 5 hours during the night without breastfeeding.  You should breastfeed your baby a minimum of 8 times in a 24-hour period until you begin to introduce solid foods to your baby at around 6 months of age. BREAST MILK PUMPING Pumping and storing breast milk allows you to ensure that your baby is exclusively fed your breast milk, even at times when you are unable to breastfeed. This is especially important if you are going back to work while you are still breastfeeding or when you are not able to be present during feedings. Your lactation consultant can give you guidelines on how long it is safe to store breast milk.  A breast pump is a machine that allows you to pump milk from your breast into a sterile bottle. The pumped breast milk can then be stored in a refrigerator or freezer. Some breast pumps are operated by hand, while others use electricity. Ask  your lactation consultant which type will work best for you. Breast pumps can be purchased, but some hospitals and breastfeeding support groups lease breast pumps on a monthly basis. A lactation consultant can teach you how to hand express breast milk, if you prefer not to use a pump.  CARING FOR YOUR BREASTS WHILE YOU BREASTFEED Nipples can become dry, cracked, and sore while breastfeeding. The following recommendations can help keep your breasts moisturized and healthy:  Avoid using soap on your nipples.   Wear a supportive bra. Although not required, special nursing bras and tank tops are designed to allow access to your breasts for breastfeeding without taking off your entire bra or top. Avoid wearing underwire style bras or extremely tight bras.  Air dry your nipples for 3 4minutes after each feeding.   Use only cotton bra pads to absorb leaked breast milk. Leaking of breast milk between feedings is normal.   Use lanolin on your nipples after breastfeeding. Lanolin helps to maintain your skin's normal moisture barrier. If you use pure lanolin you do not need to wash it off before feeding your baby again. Pure lanolin is not toxic to your baby. You may also hand express a few drops of breast milk and gently massage that milk into your nipples and allow the milk to air dry. In the first few weeks after giving birth, some women   experience extremely full breasts (engorgement). Engorgement can make your breasts feel heavy, warm, and tender to the touch. Engorgement peaks within 3 5 days after you give birth. The following recommendations can help ease engorgement:  Completely empty your breasts while breastfeeding or pumping. You may want to start by applying warm, moist heat (in the shower or with warm water-soaked hand towels) just before feeding or pumping. This increases circulation and helps the milk flow. If your baby does not completely empty your breasts while breastfeeding, pump any extra  milk after he or she is finished.  Wear a snug bra (nursing or regular) or tank top for 1 2 days to signal your body to slightly decrease milk production.  Apply ice packs to your breasts, unless this is too uncomfortable for you.  Make sure that your baby is latched on and positioned properly while breastfeeding. If engorgement persists after 48 hours of following these recommendations, contact your health care provider or a lactation consultant. OVERALL HEALTH CARE RECOMMENDATIONS WHILE BREASTFEEDING  Eat healthy foods. Alternate between meals and snacks, eating 3 of each per day. Because what you eat affects your breast milk, some of the foods may make your baby more irritable than usual. Avoid eating these foods if you are sure that they are negatively affecting your baby.  Drink milk, fruit juice, and water to satisfy your thirst (about 10 glasses a day).   Rest often, relax, and continue to take your prenatal vitamins to prevent fatigue, stress, and anemia.  Continue breast self-awareness checks.  Avoid chewing and smoking tobacco.  Avoid alcohol and drug use. Some medicines that may be harmful to your baby can pass through breast milk. It is important to ask your health care provider before taking any medicine, including all over-the-counter and prescription medicine as well as vitamin and herbal supplements. It is possible to become pregnant while breastfeeding. If birth control is desired, ask your health care provider about options that will be safe for your baby. SEEK MEDICAL CARE IF:   You feel like you want to stop breastfeeding or have become frustrated with breastfeeding.  You have painful breasts or nipples.  Your nipples are cracked or bleeding.  Your breasts are red, tender, or warm.  You have a swollen area on either breast.  You have a fever or chills.  You have nausea or vomiting.  You have drainage other than breast milk from your nipples.  Your breasts  do not become full before feedings by the 5th day after you give birth.  You feel sad and depressed.  Your baby is too sleepy to eat well.  Your baby is having trouble sleeping.   Your baby is wetting less than 3 diapers in a 24-hour period.  Your baby has less than 3 stools in a 24-hour period.  Your baby's skin or the white part of his or her eyes becomes yellow.   Your baby is not gaining weight by 5 days of age. SEEK IMMEDIATE MEDICAL CARE IF:   Your baby is overly tired (lethargic) and does not want to wake up and feed.  Your baby develops an unexplained fever. Document Released: 02/14/2005 Document Revised: 10/17/2012 Document Reviewed: 08/08/2012 [employer] Patient Information 2014 [employer].  

## 2013-02-25 NOTE — Progress Notes (Signed)
BP looks good. NST reviewed and reactive. If IR not scheduled by next visit, will need to call them.

## 2013-02-27 ENCOUNTER — Ambulatory Visit (HOSPITAL_COMMUNITY)
Admission: RE | Admit: 2013-02-27 | Discharge: 2013-02-27 | Disposition: A | Discharge status: Home / Self Care | Payer: Medicaid Other | Source: Ambulatory Visit | Attending: Obstetrics & Gynecology | Admitting: Obstetrics & Gynecology

## 2013-02-27 ENCOUNTER — Other Ambulatory Visit: Payer: Medicaid Other

## 2013-02-27 ENCOUNTER — Encounter (HOSPITAL_COMMUNITY): Payer: Self-pay | Admitting: Family

## 2013-02-27 ENCOUNTER — Other Ambulatory Visit: Payer: Self-pay | Admitting: Obstetrics & Gynecology

## 2013-02-27 ENCOUNTER — Inpatient Hospital Stay (HOSPITAL_COMMUNITY)
Admission: AD | Admit: 2013-02-27 | Discharge: 2013-02-27 | Disposition: A | Discharge status: Home / Self Care | Payer: Medicaid Other | Source: Ambulatory Visit | Attending: Obstetrics & Gynecology | Admitting: Obstetrics & Gynecology

## 2013-02-27 DIAGNOSIS — O10019 Pre-existing essential hypertension complicating pregnancy, unspecified trimester: Secondary | ICD-10-CM | POA: Insufficient documentation

## 2013-02-27 DIAGNOSIS — O10013 Pre-existing essential hypertension complicating pregnancy, third trimester: Secondary | ICD-10-CM

## 2013-02-27 DIAGNOSIS — O3410 Maternal care for benign tumor of corpus uteri, unspecified trimester: Secondary | ICD-10-CM | POA: Insufficient documentation

## 2013-02-27 DIAGNOSIS — Z3689 Encounter for other specified antenatal screening: Secondary | ICD-10-CM

## 2013-02-27 DIAGNOSIS — Z36 Encounter for antenatal screening of mother: Secondary | ICD-10-CM

## 2013-02-27 DIAGNOSIS — O139 Gestational [pregnancy-induced] hypertension without significant proteinuria, unspecified trimester: Secondary | ICD-10-CM | POA: Insufficient documentation

## 2013-02-27 DIAGNOSIS — O09299 Supervision of pregnancy with other poor reproductive or obstetric history, unspecified trimester: Secondary | ICD-10-CM | POA: Insufficient documentation

## 2013-02-27 LAB — URINALYSIS, ROUTINE W REFLEX MICROSCOPIC
Glucose, UA: NEGATIVE mg/dL
Hgb urine dipstick: NEGATIVE
Ketones, ur: NEGATIVE mg/dL
Leukocytes, UA: NEGATIVE
Nitrite: NEGATIVE
Specific Gravity, Urine: 1.015 (ref 1.005–1.030)
Urobilinogen, UA: 0.2 mg/dL (ref 0.0–1.0)
pH: 7 (ref 5.0–8.0)

## 2013-02-27 NOTE — MAU Provider Note (Signed)
  History     CSN: [id number]  Arrival date and time: 02/27/13 4098   First Provider Initiated Contact with Patient 02/27/13 1101      No chief complaint on file.  HPI  Pt is a 32 yo G4P3003 at [redacted]w[redacted]d weeks IUP sent from MFM due to possible deceleration noted while doing ultrasound.  Pt receiving fetal monitoring due to hypertension in pregnancy.  No other complaints noted by patient.    Past Medical History  Diagnosis Date  . Hypertension     Past Surgical History  Procedure Laterality Date  . Laparoscopic cholecystectomy  2010    [place]  . Tonsillectomy      Family History  Problem Relation Age of Onset  . Diabetes Mother   . Hypertension Mother   . Kidney disease Mother   . Heart disease Mother     History  Substance Use Topics  . Smoking status: Never Smoker   . Smokeless tobacco: Never Used  . Alcohol Use: No    Allergies: No Known Allergies  No prescriptions prior to admission    ROS Pertinent information in HPI  Physical Exam   Blood pressure 134/70, pulse 84, temperature 97.9 F (36.6 C), temperature source Oral, resp. rate 16, last menstrual period 07/01/2012.  Physical Exam  Constitutional: She is oriented to person, place, and time. She appears well-developed and well-nourished. No distress.  HENT:  Head: Normocephalic.  Neck: Normal range of motion. Neck supple.  Genitourinary: No bleeding around the vagina.  Neurological: She is alert and oriented to person, place, and time.  Skin: Skin is warm and dry.    MAU Course  Procedures  Results for orders placed during the hospital encounter of 02/27/13 (from the past 24 hour(s))  URINALYSIS, ROUTINE W REFLEX MICROSCOPIC     Status: None   Collection Time    02/27/13 11:00 AM      Result Value Range   Color, Urine YELLOW  YELLOW   APPearance CLEAR  CLEAR   Specific Gravity, Urine 1.015  1.005 - 1.030   pH 7.0  5.0 - 8.0   Glucose, UA NEGATIVE  NEGATIVE mg/dL   Hgb urine dipstick NEGATIVE  NEGATIVE   Bilirubin Urine NEGATIVE  NEGATIVE   Ketones, ur NEGATIVE  NEGATIVE mg/dL   Protein, ur NEGATIVE  NEGATIVE mg/dL   Urobilinogen, UA 0.2  0.0 - 1.0 mg/dL   Nitrite NEGATIVE  NEGATIVE   Leukocytes, UA NEGATIVE  NEGATIVE    Assessment and Plan  32 yo G4P3003 at [redacted]w[redacted]d wks IUP Category I FHR Hypertension in Pregnancy  Plan: Discharge to home Keep scheduled appointment  Reviewed fetal movement  [facility] 02/27/2013, 1:31 PM

## 2013-02-27 NOTE — MAU Note (Signed)
32 yo, G4P3 at [redacted]w[redacted]d, presents to MAU from MFM for continued monitoring after decels noted in MFM.

## 2013-02-27 NOTE — MAU Provider Note (Signed)
Reviewed tracing No decelerations  Attestation of Attending Supervision of Advanced Practitioner (CNM/NP): Evaluation and management procedures were performed by the Advanced Practitioner under my supervision and collaboration. I have reviewed the Advanced Practitioner's note and chart, and I agree with the management and plan.  Novaleigh Kohlman H. 1:44 PM

## 2013-03-04 ENCOUNTER — Ambulatory Visit (INDEPENDENT_AMBULATORY_CARE_PROVIDER_SITE_OTHER): Payer: Medicaid Other | Admitting: Obstetrics & Gynecology

## 2013-03-04 ENCOUNTER — Encounter (HOSPITAL_COMMUNITY): Payer: Self-pay | Admitting: Pharmacy Technician

## 2013-03-04 ENCOUNTER — Other Ambulatory Visit: Payer: Self-pay | Admitting: Obstetrics & Gynecology

## 2013-03-04 ENCOUNTER — Other Ambulatory Visit (HOSPITAL_COMMUNITY): Payer: Self-pay | Admitting: Interventional Radiology

## 2013-03-04 VITALS — BP 120/74 | Temp 97.8°F | Wt 219.6 lb

## 2013-03-04 DIAGNOSIS — O10013 Pre-existing essential hypertension complicating pregnancy, third trimester: Secondary | ICD-10-CM

## 2013-03-04 DIAGNOSIS — D573 Sickle-cell trait: Secondary | ICD-10-CM

## 2013-03-04 DIAGNOSIS — O169 Unspecified maternal hypertension, unspecified trimester: Secondary | ICD-10-CM

## 2013-03-04 DIAGNOSIS — O10019 Pre-existing essential hypertension complicating pregnancy, unspecified trimester: Secondary | ICD-10-CM

## 2013-03-04 DIAGNOSIS — IMO0002 Reserved for concepts with insufficient information to code with codable children: Secondary | ICD-10-CM

## 2013-03-04 DIAGNOSIS — O0990 Supervision of high risk pregnancy, unspecified, unspecified trimester: Secondary | ICD-10-CM

## 2013-03-04 LAB — FETAL NONSTRESS TEST

## 2013-03-04 LAB — POCT URINALYSIS DIP (DEVICE)
BILIRUBIN URINE: NEGATIVE
Glucose, UA: NEGATIVE mg/dL
HGB URINE DIPSTICK: NEGATIVE
Ketones, ur: NEGATIVE mg/dL
NITRITE: NEGATIVE
PH: 7 (ref 5.0–8.0)
Protein, ur: NEGATIVE mg/dL
Specific Gravity, Urine: 1.02 (ref 1.005–1.030)
UROBILINOGEN UA: 0.2 mg/dL (ref 0.0–1.0)

## 2013-03-04 NOTE — Progress Notes (Signed)
NST performed today was reviewed and was found to be reactive.  Continue recommended antenatal testing and prenatal care. BP stable Will have RN call IR to schedule patient for drainage procedure No other complaints or concerns.  Fetal movement and labor precautions reviewed. Pelvic cultures next visit

## 2013-03-04 NOTE — Patient Instructions (Signed)
Return to clinic for any obstetric concerns or go to MAU for evaluation  

## 2013-03-04 NOTE — Progress Notes (Signed)
Pt had growth US/BPP on 12/31. Next BPP @ [facility] on 03/06/13

## 2013-03-04 NOTE — Progress Notes (Signed)
Pulse: 76

## 2013-03-04 NOTE — Progress Notes (Signed)
TC to Interventional Radiology at [facility]. Advised that the scheduler was out today and she would be calling patient in the next few days to schedule this procedure.

## 2013-03-05 ENCOUNTER — Other Ambulatory Visit: Payer: Self-pay | Admitting: Radiology

## 2013-03-06 ENCOUNTER — Other Ambulatory Visit: Payer: Self-pay | Admitting: Obstetrics & Gynecology

## 2013-03-06 ENCOUNTER — Encounter: Payer: Self-pay | Admitting: Obstetrics & Gynecology

## 2013-03-06 ENCOUNTER — Ambulatory Visit (HOSPITAL_COMMUNITY)
Admission: RE | Admit: 2013-03-06 | Discharge: 2013-03-06 | Disposition: A | Discharge status: Home / Self Care | Payer: Medicaid Other | Source: Ambulatory Visit | Attending: Family Medicine | Admitting: Family Medicine

## 2013-03-06 DIAGNOSIS — O322XX0 Maternal care for transverse and oblique lie, not applicable or unspecified: Secondary | ICD-10-CM | POA: Insufficient documentation

## 2013-03-06 DIAGNOSIS — O3410 Maternal care for benign tumor of corpus uteri, unspecified trimester: Secondary | ICD-10-CM | POA: Insufficient documentation

## 2013-03-06 DIAGNOSIS — O10019 Pre-existing essential hypertension complicating pregnancy, unspecified trimester: Secondary | ICD-10-CM | POA: Insufficient documentation

## 2013-03-06 DIAGNOSIS — O169 Unspecified maternal hypertension, unspecified trimester: Secondary | ICD-10-CM

## 2013-03-06 DIAGNOSIS — O365990 Maternal care for other known or suspected poor fetal growth, unspecified trimester, not applicable or unspecified: Secondary | ICD-10-CM | POA: Insufficient documentation

## 2013-03-06 DIAGNOSIS — D573 Sickle-cell trait: Secondary | ICD-10-CM

## 2013-03-06 DIAGNOSIS — O09299 Supervision of pregnancy with other poor reproductive or obstetric history, unspecified trimester: Secondary | ICD-10-CM | POA: Insufficient documentation

## 2013-03-08 ENCOUNTER — Ambulatory Visit (HOSPITAL_COMMUNITY)
Admission: RE | Admit: 2013-03-08 | Discharge: 2013-03-08 | Disposition: A | Discharge status: Home / Self Care | Payer: Medicaid Other | Source: Ambulatory Visit | Attending: Interventional Radiology | Admitting: Interventional Radiology

## 2013-03-08 ENCOUNTER — Encounter (HOSPITAL_COMMUNITY): Payer: Self-pay

## 2013-03-08 VITALS — BP 155/75 | HR 87 | Temp 98.0°F | Resp 18

## 2013-03-08 DIAGNOSIS — O34599 Maternal care for other abnormalities of gravid uterus, unspecified trimester: Secondary | ICD-10-CM | POA: Insufficient documentation

## 2013-03-08 DIAGNOSIS — IMO0002 Reserved for concepts with insufficient information to code with codable children: Secondary | ICD-10-CM

## 2013-03-08 MED ORDER — FENTANYL CITRATE 0.05 MG/ML IJ SOLN
INTRAMUSCULAR | Status: AC
  Filled 2013-03-08: qty 4

## 2013-03-08 MED ORDER — SODIUM CHLORIDE 0.9 % IV SOLN
INTRAVENOUS | Status: DC
  Administered 2013-03-08: 13:00:00 via INTRAVENOUS

## 2013-03-08 MED ORDER — MIDAZOLAM HCL 2 MG/2ML IJ SOLN
INTRAMUSCULAR | Status: AC
  Filled 2013-03-08: qty 4

## 2013-03-08 NOTE — Progress Notes (Signed)
[facility] short stay called regarding pt having radiological surgery for a pelvic cyst I&D. OB RR RN in route for pre op NST.

## 2013-03-08 NOTE — Progress Notes (Signed)
[facility] Short Stay called stating pt is back from IR. OB RR RN in route

## 2013-03-08 NOTE — Progress Notes (Signed)
Pt arrives to IR for scheduled US guided pelvic cyst aspiration. She has had this before and has seen Dr. Kathlene Cote in consult. See his full consult note in PACS IR Rad eval.  Procedure was discussed and risks, complications, explained. Pt elects to avoid sedation. Will start IV just in case. OB RN at bedside, NST complete  Consent signed for procedure.  Ascencion Dike PA-C Interventional Radiology 03/08/2013 12:04 PM

## 2013-03-08 NOTE — Progress Notes (Addendum)
At bedside. Pt in short stay. Pt is G4P3 at [place] here for a minor surgery that pt states she will not be medicated for. Pt reports positive fetal movement, no vaginal bleeding, no leaking of fluid and pt states she has occasional contractions. Pt denies any complications with pregnancy other than pelvic cyst.

## 2013-03-08 NOTE — Progress Notes (Signed)
At pt bedside. Pt reports procedure "went well." Pt reports positive fetal movement since procedure, no contractions, no leakage of fluid or vaginal bleeding.

## 2013-03-11 ENCOUNTER — Ambulatory Visit (INDEPENDENT_AMBULATORY_CARE_PROVIDER_SITE_OTHER): Payer: Medicaid Other | Admitting: Obstetrics & Gynecology

## 2013-03-11 VITALS — BP 123/78 | Temp 98.2°F | Wt 217.5 lb

## 2013-03-11 DIAGNOSIS — O0990 Supervision of high risk pregnancy, unspecified, unspecified trimester: Secondary | ICD-10-CM

## 2013-03-11 DIAGNOSIS — O10013 Pre-existing essential hypertension complicating pregnancy, third trimester: Secondary | ICD-10-CM

## 2013-03-11 DIAGNOSIS — O10019 Pre-existing essential hypertension complicating pregnancy, unspecified trimester: Secondary | ICD-10-CM

## 2013-03-11 LAB — POCT URINALYSIS DIP (DEVICE)
Bilirubin Urine: NEGATIVE
Glucose, UA: NEGATIVE mg/dL
HGB URINE DIPSTICK: NEGATIVE
KETONES UR: NEGATIVE mg/dL
Leukocytes, UA: NEGATIVE
Nitrite: NEGATIVE
PH: 5.5 (ref 5.0–8.0)
PROTEIN: NEGATIVE mg/dL
SPECIFIC GRAVITY, URINE: 1.02 (ref 1.005–1.030)
Urobilinogen, UA: 0.2 mg/dL (ref 0.0–1.0)

## 2013-03-11 LAB — OB RESULTS CONSOLE GBS: GBS: NEGATIVE

## 2013-03-11 NOTE — Progress Notes (Signed)
P=81 Pt reports pain/pressure in lower abd at all times, contractions when resting.

## 2013-03-11 NOTE — Patient Instructions (Signed)

## 2013-03-11 NOTE — Progress Notes (Signed)
NST reactive. Had cyst drainage no problems. GBS and GC and CT done today.

## 2013-03-13 ENCOUNTER — Other Ambulatory Visit: Payer: Medicaid Other

## 2013-03-13 LAB — CULTURE, BETA STREP (GROUP B ONLY)

## 2013-03-14 ENCOUNTER — Ambulatory Visit (INDEPENDENT_AMBULATORY_CARE_PROVIDER_SITE_OTHER): Payer: Medicaid Other | Admitting: *Deleted

## 2013-03-14 VITALS — BP 118/71 | Wt 220.2 lb

## 2013-03-14 DIAGNOSIS — O10019 Pre-existing essential hypertension complicating pregnancy, unspecified trimester: Secondary | ICD-10-CM

## 2013-03-14 LAB — GC/CHLAMYDIA PROBE AMP
CT Probe RNA: NEGATIVE
GC Probe RNA: NEGATIVE

## 2013-03-14 LAB — US OB FOLLOW UP

## 2013-03-14 NOTE — Progress Notes (Signed)
P=  82

## 2013-03-14 NOTE — Progress Notes (Signed)
NST reactive 03/14/13

## 2013-03-18 ENCOUNTER — Encounter: Payer: Self-pay | Admitting: Obstetrics and Gynecology

## 2013-03-18 ENCOUNTER — Ambulatory Visit (INDEPENDENT_AMBULATORY_CARE_PROVIDER_SITE_OTHER): Payer: Medicaid Other | Admitting: Obstetrics and Gynecology

## 2013-03-18 VITALS — BP 124/77 | Temp 97.2°F | Wt 217.4 lb

## 2013-03-18 DIAGNOSIS — E669 Obesity, unspecified: Secondary | ICD-10-CM

## 2013-03-18 DIAGNOSIS — R1900 Intra-abdominal and pelvic swelling, mass and lump, unspecified site: Secondary | ICD-10-CM

## 2013-03-18 DIAGNOSIS — O0990 Supervision of high risk pregnancy, unspecified, unspecified trimester: Secondary | ICD-10-CM

## 2013-03-18 DIAGNOSIS — O10019 Pre-existing essential hypertension complicating pregnancy, unspecified trimester: Secondary | ICD-10-CM

## 2013-03-18 LAB — POCT URINALYSIS DIP (DEVICE)
Bilirubin Urine: NEGATIVE
GLUCOSE, UA: NEGATIVE mg/dL
Hgb urine dipstick: NEGATIVE
Ketones, ur: NEGATIVE mg/dL
NITRITE: NEGATIVE
Protein, ur: NEGATIVE mg/dL
SPECIFIC GRAVITY, URINE: 1.02 (ref 1.005–1.030)
Urobilinogen, UA: 0.2 mg/dL (ref 0.0–1.0)
pH: 7.5 (ref 5.0–8.0)

## 2013-03-18 NOTE — Progress Notes (Signed)
Pulse-84 Patient reports a lot of pelvic pressure & pain and contractions

## 2013-03-18 NOTE — Progress Notes (Signed)
[place] for growth scheduled on 1/29 @ 1400

## 2013-03-18 NOTE — Progress Notes (Signed)
Patient is doing well without complaints. S/P IR drainage of pelvic mass on 1/9. Patient reports feeling so much better. FM/labor precautions reviewed NST reviewed and reactive

## 2013-03-20 ENCOUNTER — Encounter: Payer: Self-pay | Admitting: *Deleted

## 2013-03-21 ENCOUNTER — Ambulatory Visit (INDEPENDENT_AMBULATORY_CARE_PROVIDER_SITE_OTHER): Payer: Medicaid Other | Admitting: *Deleted

## 2013-03-21 ENCOUNTER — Ambulatory Visit (HOSPITAL_COMMUNITY)
Admission: RE | Admit: 2013-03-21 | Discharge: 2013-03-21 | Disposition: A | Discharge status: Home / Self Care | Payer: Medicaid Other | Source: Ambulatory Visit | Attending: Obstetrics & Gynecology | Admitting: Obstetrics & Gynecology

## 2013-03-21 VITALS — BP 126/74

## 2013-03-21 DIAGNOSIS — O10019 Pre-existing essential hypertension complicating pregnancy, unspecified trimester: Secondary | ICD-10-CM

## 2013-03-21 DIAGNOSIS — O368390 Maternal care for abnormalities of the fetal heart rate or rhythm, unspecified trimester, not applicable or unspecified: Secondary | ICD-10-CM | POA: Insufficient documentation

## 2013-03-21 DIAGNOSIS — O09299 Supervision of pregnancy with other poor reproductive or obstetric history, unspecified trimester: Secondary | ICD-10-CM | POA: Insufficient documentation

## 2013-03-21 NOTE — Progress Notes (Signed)
P = 104    Dr. Gala Romney notified of NST results- pt sent to [place] dept for BPP.

## 2013-03-25 ENCOUNTER — Encounter: Payer: Self-pay | Admitting: Obstetrics and Gynecology

## 2013-03-25 ENCOUNTER — Ambulatory Visit (INDEPENDENT_AMBULATORY_CARE_PROVIDER_SITE_OTHER): Payer: Medicaid Other | Admitting: Obstetrics and Gynecology

## 2013-03-25 VITALS — BP 128/79 | Wt 219.8 lb

## 2013-03-25 DIAGNOSIS — N898 Other specified noninflammatory disorders of vagina: Secondary | ICD-10-CM

## 2013-03-25 DIAGNOSIS — O10019 Pre-existing essential hypertension complicating pregnancy, unspecified trimester: Secondary | ICD-10-CM

## 2013-03-25 LAB — POCT URINALYSIS DIP (DEVICE)
BILIRUBIN URINE: NEGATIVE
Glucose, UA: NEGATIVE mg/dL
Hgb urine dipstick: NEGATIVE
KETONES UR: NEGATIVE mg/dL
Nitrite: NEGATIVE
Protein, ur: NEGATIVE mg/dL
Specific Gravity, Urine: 1.015 (ref 1.005–1.030)
Urobilinogen, UA: 0.2 mg/dL (ref 0.0–1.0)
pH: 6.5 (ref 5.0–8.0)

## 2013-03-25 LAB — WET PREP, GENITAL
Trich, Wet Prep: NONE SEEN
Yeast Wet Prep HPF POC: NONE SEEN

## 2013-03-25 NOTE — Addendum Note (Signed)
Addended by: Ernie Avena on: 03/25/2013 10:20 AM   Modules accepted: Orders

## 2013-03-25 NOTE — Progress Notes (Signed)
P: 89 Has some itching with discharge.

## 2013-03-25 NOTE — Progress Notes (Signed)
[place] for growth scheduled on 03/28/13.  IOL scheduled 04/07/13 @ 0800.

## 2013-03-25 NOTE — Patient Instructions (Signed)
Etonogestrel implant What is this medicine? ETONOGESTREL (et oh noe JES trel) is a contraceptive (birth control) device. It is used to prevent pregnancy. It can be used for up to 3 years. This medicine may be used for other purposes; ask your health care provider or pharmacist if you have questions. COMMON BRAND NAME(S): Implanon, Nexplanon  What should I tell my health care provider before I take this medicine? They need to know if you have any of these conditions: -abnormal vaginal bleeding -blood vessel disease or blood clots -cancer of the breast, cervix, or liver -depression -diabetes -gallbladder disease -headaches -heart disease or recent heart attack -high blood pressure -high cholesterol -kidney disease -liver disease -renal disease -seizures -tobacco smoker -an unusual or allergic reaction to etonogestrel, other hormones, anesthetics or antiseptics, medicines, foods, dyes, or preservatives -pregnant or trying to get pregnant -breast-feeding How should I use this medicine? This device is inserted just under the skin on the inner side of your upper arm by a health care professional. Talk to your pediatrician regarding the use of this medicine in children. Special care may be needed. Overdosage: If you think you've taken too much of this medicine contact a poison control center or emergency room at once. Overdosage: If you think you have taken too much of this medicine contact a poison control center or emergency room at once. NOTE: This medicine is only for you. Do not share this medicine with others. What if I miss a dose? This does not apply. What may interact with this medicine? Do not take this medicine with any of the following medications: -amprenavir -bosentan -fosamprenavir This medicine may also interact with the following medications: -barbiturate medicines for inducing sleep or treating seizures -certain medicines for fungal infections like ketoconazole and  itraconazole -griseofulvin -medicines to treat seizures like carbamazepine, felbamate, oxcarbazepine, phenytoin, topiramate -modafinil -phenylbutazone -rifampin -some medicines to treat HIV infection like atazanavir, indinavir, lopinavir, nelfinavir, tipranavir, ritonavir -St. John's wort This list may not describe all possible interactions. Give your health care provider a list of all the medicines, herbs, non-prescription drugs, or dietary supplements you use. Also tell them if you smoke, drink alcohol, or use illegal drugs. Some items may interact with your medicine. What should I watch for while using this medicine? This product does not protect you against HIV infection (AIDS) or other sexually transmitted diseases. You should be able to feel the implant by pressing your fingertips over the skin where it was inserted. Tell your doctor if you cannot feel the implant. What side effects may I notice from receiving this medicine? Side effects that you should report to your doctor or health care professional as soon as possible: -allergic reactions like skin rash, itching or hives, swelling of the face, lips, or tongue -breast lumps -changes in vision -confusion, trouble speaking or understanding -dark urine -depressed mood -general ill feeling or flu-like symptoms -light-colored stools -loss of appetite, nausea -right upper belly pain -severe headaches -severe pain, swelling, or tenderness in the abdomen -shortness of breath, chest pain, swelling in a leg -signs of pregnancy -sudden numbness or weakness of the face, arm or leg -trouble walking, dizziness, loss of balance or coordination -unusual vaginal bleeding, discharge -unusually weak or tired -yellowing of the eyes or skin Side effects that usually do not require medical attention (Report these to your doctor or health care professional if they continue or are bothersome.): -acne -breast pain -changes in  weight -cough -fever or chills -headache -irregular menstrual bleeding -itching, burning,   and vaginal discharge -pain or difficulty passing urine -sore throat This list may not describe all possible side effects. Call your doctor for medical advice about side effects. You may report side effects to FDA at 1-800-FDA-1088. Where should I keep my medicine? This drug is given in a hospital or clinic and will not be stored at home. NOTE: This sheet is a summary. It may not cover all possible information. If you have questions about this medicine, talk to your doctor, pharmacist, or health care provider.  2014, Elsevier/Gold Standard. (2011-08-22 15:37:45)  

## 2013-03-25 NOTE — Progress Notes (Signed)
Cont 2x/wk FATs.  Malodorous vag d/c> [facility] sent.

## 2013-03-27 ENCOUNTER — Telehealth (HOSPITAL_COMMUNITY): Payer: Self-pay | Admitting: *Deleted

## 2013-03-27 NOTE — Telephone Encounter (Signed)
Preadmission screen  

## 2013-03-28 ENCOUNTER — Ambulatory Visit (INDEPENDENT_AMBULATORY_CARE_PROVIDER_SITE_OTHER): Payer: Medicaid Other | Admitting: *Deleted

## 2013-03-28 ENCOUNTER — Ambulatory Visit (HOSPITAL_COMMUNITY)
Admission: RE | Admit: 2013-03-28 | Discharge: 2013-03-28 | Disposition: A | Discharge status: Home / Self Care | Payer: Medicaid Other | Source: Ambulatory Visit | Attending: Obstetrics and Gynecology | Admitting: Obstetrics and Gynecology

## 2013-03-28 VITALS — BP 117/74

## 2013-03-28 DIAGNOSIS — O10019 Pre-existing essential hypertension complicating pregnancy, unspecified trimester: Secondary | ICD-10-CM

## 2013-03-28 DIAGNOSIS — O09299 Supervision of pregnancy with other poor reproductive or obstetric history, unspecified trimester: Secondary | ICD-10-CM | POA: Insufficient documentation

## 2013-03-28 NOTE — Progress Notes (Signed)
P = 97   [place] for growth today

## 2013-04-01 ENCOUNTER — Ambulatory Visit (INDEPENDENT_AMBULATORY_CARE_PROVIDER_SITE_OTHER): Payer: Medicaid Other | Admitting: Obstetrics & Gynecology

## 2013-04-01 VITALS — BP 122/75 | Wt 223.8 lb

## 2013-04-01 DIAGNOSIS — B9689 Other specified bacterial agents as the cause of diseases classified elsewhere: Secondary | ICD-10-CM

## 2013-04-01 DIAGNOSIS — O0990 Supervision of high risk pregnancy, unspecified, unspecified trimester: Secondary | ICD-10-CM

## 2013-04-01 DIAGNOSIS — A499 Bacterial infection, unspecified: Secondary | ICD-10-CM

## 2013-04-01 DIAGNOSIS — O10019 Pre-existing essential hypertension complicating pregnancy, unspecified trimester: Secondary | ICD-10-CM

## 2013-04-01 DIAGNOSIS — N760 Acute vaginitis: Secondary | ICD-10-CM

## 2013-04-01 LAB — POCT URINALYSIS DIP (DEVICE)
BILIRUBIN URINE: NEGATIVE
Glucose, UA: NEGATIVE mg/dL
Hgb urine dipstick: NEGATIVE
Ketones, ur: NEGATIVE mg/dL
LEUKOCYTES UA: NEGATIVE
NITRITE: NEGATIVE
PH: 6.5 (ref 5.0–8.0)
PROTEIN: NEGATIVE mg/dL
Specific Gravity, Urine: 1.015 (ref 1.005–1.030)
Urobilinogen, UA: 0.2 mg/dL (ref 0.0–1.0)

## 2013-04-01 MED ORDER — METRONIDAZOLE 500 MG PO TABS: 500.0000 mg | ORAL_TABLET | Freq: Two times a day (BID) | ORAL | Status: DC

## 2013-04-01 NOTE — Progress Notes (Signed)
1/29 EFW 45%, normal AFV, cephalic.   NST performed today was reviewed and was found to be reactive.  Continue recommended antenatal testing and prenatal care. Will obtain treatment for BV No other complaints or concerns.  BP, fetal movement and labor precautions reviewed.

## 2013-04-01 NOTE — Progress Notes (Signed)
P=  Pt. C/o of malodorous discharge; wet prep results from last week show BV; Flagyl ordered per protocol.

## 2013-04-01 NOTE — Progress Notes (Signed)
P-85 

## 2013-04-01 NOTE — Patient Instructions (Signed)
Return to clinic for any obstetric concerns or go to MAU for evaluation  

## 2013-04-04 ENCOUNTER — Inpatient Hospital Stay (HOSPITAL_COMMUNITY): Payer: Medicaid Other | Admitting: Anesthesiology

## 2013-04-04 ENCOUNTER — Encounter (HOSPITAL_COMMUNITY): Payer: Medicaid Other | Admitting: Anesthesiology

## 2013-04-04 ENCOUNTER — Ambulatory Visit (INDEPENDENT_AMBULATORY_CARE_PROVIDER_SITE_OTHER): Payer: Medicaid Other | Admitting: *Deleted

## 2013-04-04 ENCOUNTER — Encounter (HOSPITAL_COMMUNITY)
Admission: AD | Disposition: A | Discharge status: Home / Self Care | Payer: Self-pay | Source: Ambulatory Visit | Attending: Obstetrics & Gynecology

## 2013-04-04 ENCOUNTER — Inpatient Hospital Stay (HOSPITAL_COMMUNITY)
Admission: AD | Admit: 2013-04-04 | Discharge: 2013-04-07 | DRG: 765 | Disposition: A | Discharge status: Home / Self Care | Payer: Medicaid Other | Source: Ambulatory Visit | Attending: Obstetrics & Gynecology | Admitting: Obstetrics & Gynecology

## 2013-04-04 ENCOUNTER — Encounter (HOSPITAL_COMMUNITY): Payer: Self-pay | Admitting: *Deleted

## 2013-04-04 VITALS — BP 133/77

## 2013-04-04 DIAGNOSIS — O10019 Pre-existing essential hypertension complicating pregnancy, unspecified trimester: Secondary | ICD-10-CM

## 2013-04-04 DIAGNOSIS — O9903 Anemia complicating the puerperium: Secondary | ICD-10-CM | POA: Diagnosis not present

## 2013-04-04 DIAGNOSIS — R1900 Intra-abdominal and pelvic swelling, mass and lump, unspecified site: Secondary | ICD-10-CM | POA: Diagnosis present

## 2013-04-04 DIAGNOSIS — O99214 Obesity complicating childbirth: Secondary | ICD-10-CM

## 2013-04-04 DIAGNOSIS — E669 Obesity, unspecified: Secondary | ICD-10-CM

## 2013-04-04 DIAGNOSIS — O328XX0 Maternal care for other malpresentation of fetus, not applicable or unspecified: Principal | ICD-10-CM | POA: Diagnosis present

## 2013-04-04 DIAGNOSIS — Z9889 Other specified postprocedural states: Secondary | ICD-10-CM

## 2013-04-04 DIAGNOSIS — O0990 Supervision of high risk pregnancy, unspecified, unspecified trimester: Secondary | ICD-10-CM

## 2013-04-04 DIAGNOSIS — D649 Anemia, unspecified: Secondary | ICD-10-CM | POA: Diagnosis not present

## 2013-04-04 DIAGNOSIS — O1002 Pre-existing essential hypertension complicating childbirth: Secondary | ICD-10-CM | POA: Diagnosis present

## 2013-04-04 LAB — TYPE AND SCREEN
ABO/RH(D): A POS
Antibody Screen: NEGATIVE

## 2013-04-04 LAB — US OB FOLLOW UP

## 2013-04-04 LAB — CBC
HEMATOCRIT: 28 % — AB (ref 36.0–46.0)
Hemoglobin: 9.2 g/dL — ABNORMAL LOW (ref 12.0–15.0)
MCH: 21.2 pg — ABNORMAL LOW (ref 26.0–34.0)
MCHC: 32.9 g/dL (ref 30.0–36.0)
MCV: 64.7 fL — AB (ref 78.0–100.0)
PLATELETS: 243 10*3/uL (ref 150–400)
RBC: 4.33 MIL/uL (ref 3.87–5.11)
RDW: 16 % — ABNORMAL HIGH (ref 11.5–15.5)
WBC: 7.5 10*3/uL (ref 4.0–10.5)

## 2013-04-04 LAB — ABO/RH: ABO/RH(D): A POS

## 2013-04-04 LAB — RPR: RPR Ser Ql: NONREACTIVE

## 2013-04-04 SURGERY — Surgical Case
Anesthesia: Spinal

## 2013-04-04 MED ORDER — SIMETHICONE 80 MG PO CHEW
80.0000 mg | CHEWABLE_TABLET | ORAL | Status: DC | PRN
Start: 2013-04-04 — End: 2013-04-07

## 2013-04-04 MED ORDER — LANOLIN HYDROUS EX OINT: 1.0000 "application " | TOPICAL_OINTMENT | CUTANEOUS | Status: DC | PRN

## 2013-04-04 MED ORDER — MENTHOL 3 MG MT LOZG: 1.0000 | LOZENGE | OROMUCOSAL | Status: DC | PRN

## 2013-04-04 MED ORDER — KETOROLAC TROMETHAMINE 30 MG/ML IJ SOLN
30.0000 mg | Freq: Four times a day (QID) | INTRAMUSCULAR | Status: AC | PRN
  Administered 2013-04-04: 30 mg via INTRAMUSCULAR

## 2013-04-04 MED ORDER — OXYTOCIN 40 UNITS IN LACTATED RINGERS INFUSION - SIMPLE MED
INTRAVENOUS | Status: DC | PRN
  Administered 2013-04-04: 40 [IU] via INTRAVENOUS

## 2013-04-04 MED ORDER — NALBUPHINE HCL 10 MG/ML IJ SOLN: 5.0000 mg | INTRAMUSCULAR | Status: DC | PRN

## 2013-04-04 MED ORDER — PHENYLEPHRINE 40 MCG/ML (10ML) SYRINGE FOR IV PUSH (FOR BLOOD PRESSURE SUPPORT)
PREFILLED_SYRINGE | INTRAVENOUS | Status: AC
  Filled 2013-04-04: qty 5

## 2013-04-04 MED ORDER — PROMETHAZINE HCL 25 MG/ML IJ SOLN: 6.2500 mg | INTRAMUSCULAR | Status: DC | PRN

## 2013-04-04 MED ORDER — FENTANYL CITRATE 0.05 MG/ML IJ SOLN
INTRAMUSCULAR | Status: AC
  Filled 2013-04-04: qty 2

## 2013-04-04 MED ORDER — BISACODYL 10 MG RE SUPP: 10.0000 mg | Freq: Every day | RECTAL | Status: DC | PRN

## 2013-04-04 MED ORDER — METOCLOPRAMIDE HCL 5 MG/ML IJ SOLN: 10.0000 mg | Freq: Three times a day (TID) | INTRAMUSCULAR | Status: DC | PRN

## 2013-04-04 MED ORDER — OXYCODONE-ACETAMINOPHEN 5-325 MG PO TABS
1.0000 | ORAL_TABLET | ORAL | Status: DC | PRN
  Administered 2013-04-05 – 2013-04-07 (×9): 2 via ORAL
  Filled 2013-04-04 (×9): qty 2

## 2013-04-04 MED ORDER — DIBUCAINE 1 % RE OINT: 1.0000 "application " | TOPICAL_OINTMENT | RECTAL | Status: DC | PRN

## 2013-04-04 MED ORDER — CEFAZOLIN SODIUM-DEXTROSE 2-3 GM-% IV SOLR
2.0000 g | INTRAVENOUS | Status: AC
  Administered 2013-04-04 (×2): 2 g via INTRAVENOUS
  Filled 2013-04-04: qty 50

## 2013-04-04 MED ORDER — BUPIVACAINE IN DEXTROSE 0.75-8.25 % IT SOLN
INTRATHECAL | Status: DC | PRN
  Administered 2013-04-04: 1.5 mL via INTRATHECAL

## 2013-04-04 MED ORDER — DIPHENHYDRAMINE HCL 50 MG/ML IJ SOLN: 12.5000 mg | INTRAMUSCULAR | Status: DC | PRN

## 2013-04-04 MED ORDER — ONDANSETRON HCL 4 MG/2ML IJ SOLN
INTRAMUSCULAR | Status: DC | PRN
  Administered 2013-04-04: 4 mg via INTRAVENOUS

## 2013-04-04 MED ORDER — ZOLPIDEM TARTRATE 5 MG PO TABS: 5.0000 mg | ORAL_TABLET | Freq: Every evening | ORAL | Status: DC | PRN

## 2013-04-04 MED ORDER — SCOPOLAMINE 1 MG/3DAYS TD PT72
1.0000 | MEDICATED_PATCH | Freq: Once | TRANSDERMAL | Status: DC
  Administered 2013-04-04: 1.5 mg via TRANSDERMAL

## 2013-04-04 MED ORDER — CITRIC ACID-SODIUM CITRATE 334-500 MG/5ML PO SOLN
30.0000 mL | Freq: Once | ORAL | Status: AC
  Administered 2013-04-04: 30 mL via ORAL
  Filled 2013-04-04: qty 15

## 2013-04-04 MED ORDER — MIDAZOLAM HCL 2 MG/2ML IJ SOLN: 0.5000 mg | Freq: Once | INTRAMUSCULAR | Status: DC | PRN

## 2013-04-04 MED ORDER — IBUPROFEN 600 MG PO TABS: 600.0000 mg | ORAL_TABLET | Freq: Four times a day (QID) | ORAL | Status: DC | PRN

## 2013-04-04 MED ORDER — SIMETHICONE 80 MG PO CHEW
80.0000 mg | CHEWABLE_TABLET | ORAL | Status: DC
  Administered 2013-04-05 – 2013-04-07 (×3): 80 mg via ORAL
  Filled 2013-04-04 (×3): qty 1

## 2013-04-04 MED ORDER — PHENYLEPHRINE 8 MG IN D5W 100 ML (0.08MG/ML) PREMIX OPTIME
INJECTION | INTRAVENOUS | Status: AC
  Filled 2013-04-04: qty 100

## 2013-04-04 MED ORDER — DIPHENHYDRAMINE HCL 25 MG PO CAPS
25.0000 mg | ORAL_CAPSULE | ORAL | Status: DC | PRN
Start: 2013-04-04 — End: 2013-04-07

## 2013-04-04 MED ORDER — PHENYLEPHRINE 8 MG IN D5W 100 ML (0.08MG/ML) PREMIX OPTIME
INJECTION | INTRAVENOUS | Status: DC | PRN
  Administered 2013-04-04: 60 ug/min via INTRAVENOUS

## 2013-04-04 MED ORDER — MORPHINE SULFATE 0.5 MG/ML IJ SOLN
INTRAMUSCULAR | Status: AC
  Filled 2013-04-04: qty 10

## 2013-04-04 MED ORDER — BUPIVACAINE HCL (PF) 0.5 % IJ SOLN
INTRAMUSCULAR | Status: DC | PRN
  Administered 2013-04-04: 30 mL

## 2013-04-04 MED ORDER — SODIUM CHLORIDE 0.9 % IJ SOLN
3.0000 mL | INTRAMUSCULAR | Status: DC | PRN
  Administered 2013-04-05: 3 mL via INTRAVENOUS

## 2013-04-04 MED ORDER — ONDANSETRON HCL 4 MG/2ML IJ SOLN
INTRAMUSCULAR | Status: AC
  Filled 2013-04-04: qty 2

## 2013-04-04 MED ORDER — FLEET ENEMA 7-19 GM/118ML RE ENEM: 1.0000 | ENEMA | Freq: Every day | RECTAL | Status: DC | PRN

## 2013-04-04 MED ORDER — MEPERIDINE HCL 25 MG/ML IJ SOLN: 6.2500 mg | INTRAMUSCULAR | Status: DC | PRN

## 2013-04-04 MED ORDER — LACTATED RINGERS IV SOLN
INTRAVENOUS | Status: DC
  Administered 2013-04-04 (×2): via INTRAVENOUS

## 2013-04-04 MED ORDER — LACTATED RINGERS IV SOLN
INTRAVENOUS | Status: DC
  Administered 2013-04-04 – 2013-04-05 (×2): via INTRAVENOUS

## 2013-04-04 MED ORDER — FENTANYL CITRATE 0.05 MG/ML IJ SOLN: 25.0000 ug | INTRAMUSCULAR | Status: DC | PRN

## 2013-04-04 MED ORDER — DIPHENHYDRAMINE HCL 50 MG/ML IJ SOLN: 25.0000 mg | INTRAMUSCULAR | Status: DC | PRN

## 2013-04-04 MED ORDER — ACETAMINOPHEN 500 MG PO TABS
1000.0000 mg | ORAL_TABLET | Freq: Four times a day (QID) | ORAL | Status: AC
  Administered 2013-04-05 (×2): 1000 mg via ORAL
  Filled 2013-04-04 (×2): qty 2

## 2013-04-04 MED ORDER — SCOPOLAMINE 1 MG/3DAYS TD PT72
MEDICATED_PATCH | TRANSDERMAL | Status: AC
  Filled 2013-04-04: qty 1

## 2013-04-04 MED ORDER — IBUPROFEN 600 MG PO TABS
600.0000 mg | ORAL_TABLET | Freq: Four times a day (QID) | ORAL | Status: DC
  Administered 2013-04-05 – 2013-04-07 (×11): 600 mg via ORAL
  Filled 2013-04-04 (×11): qty 1

## 2013-04-04 MED ORDER — SENNOSIDES-DOCUSATE SODIUM 8.6-50 MG PO TABS
2.0000 | ORAL_TABLET | ORAL | Status: DC
  Administered 2013-04-05 – 2013-04-07 (×3): 2 via ORAL
  Filled 2013-04-04 (×3): qty 2

## 2013-04-04 MED ORDER — ONDANSETRON HCL 4 MG/2ML IJ SOLN
4.0000 mg | INTRAMUSCULAR | Status: DC | PRN
  Administered 2013-04-04: 4 mg via INTRAVENOUS
  Filled 2013-04-04: qty 2

## 2013-04-04 MED ORDER — KETOROLAC TROMETHAMINE 30 MG/ML IJ SOLN: 30.0000 mg | Freq: Four times a day (QID) | INTRAMUSCULAR | Status: AC | PRN

## 2013-04-04 MED ORDER — OXYTOCIN 40 UNITS IN LACTATED RINGERS INFUSION - SIMPLE MED: 62.5000 mL/h | INTRAVENOUS | Status: AC

## 2013-04-04 MED ORDER — MORPHINE SULFATE (PF) 0.5 MG/ML IJ SOLN
INTRAMUSCULAR | Status: DC | PRN
  Administered 2013-04-04: .15 mg via INTRATHECAL

## 2013-04-04 MED ORDER — TETANUS-DIPHTH-ACELL PERTUSSIS 5-2.5-18.5 LF-MCG/0.5 IM SUSP: 0.5000 mL | Freq: Once | INTRAMUSCULAR | Status: DC

## 2013-04-04 MED ORDER — FENTANYL CITRATE 0.05 MG/ML IJ SOLN
INTRAMUSCULAR | Status: DC | PRN
  Administered 2013-04-04: 25 ug via INTRATHECAL

## 2013-04-04 MED ORDER — PRENATAL MULTIVITAMIN CH
1.0000 | ORAL_TABLET | Freq: Every day | ORAL | Status: DC
  Administered 2013-04-05 – 2013-04-07 (×3): 1 via ORAL
  Filled 2013-04-04 (×3): qty 1

## 2013-04-04 MED ORDER — SIMETHICONE 80 MG PO CHEW
80.0000 mg | CHEWABLE_TABLET | Freq: Three times a day (TID) | ORAL | Status: DC
  Administered 2013-04-04 – 2013-04-07 (×7): 80 mg via ORAL
  Filled 2013-04-04 (×8): qty 1

## 2013-04-04 MED ORDER — KETOROLAC TROMETHAMINE 30 MG/ML IJ SOLN
INTRAMUSCULAR | Status: AC
  Filled 2013-04-04: qty 1

## 2013-04-04 MED ORDER — DIPHENHYDRAMINE HCL 25 MG PO CAPS: 25.0000 mg | ORAL_CAPSULE | Freq: Four times a day (QID) | ORAL | Status: DC | PRN

## 2013-04-04 MED ORDER — NALOXONE HCL 1 MG/ML IJ SOLN: 1.0000 ug/kg/h | INTRAMUSCULAR | Status: DC | PRN

## 2013-04-04 MED ORDER — LACTATED RINGERS IV SOLN
INTRAVENOUS | Status: DC | PRN
  Administered 2013-04-04: 17:00:00 via INTRAVENOUS

## 2013-04-04 MED ORDER — ONDANSETRON HCL 4 MG/2ML IJ SOLN: 4.0000 mg | Freq: Three times a day (TID) | INTRAMUSCULAR | Status: DC | PRN

## 2013-04-04 MED ORDER — OXYTOCIN 10 UNIT/ML IJ SOLN
INTRAMUSCULAR | Status: AC
  Filled 2013-04-04: qty 4

## 2013-04-04 MED ORDER — WITCH HAZEL-GLYCERIN EX PADS: 1.0000 "application " | MEDICATED_PAD | CUTANEOUS | Status: DC | PRN

## 2013-04-04 MED ORDER — FAMOTIDINE IN NACL 20-0.9 MG/50ML-% IV SOLN
20.0000 mg | Freq: Once | INTRAVENOUS | Status: AC
  Administered 2013-04-04: 20 mg via INTRAVENOUS
  Filled 2013-04-04: qty 50

## 2013-04-04 MED ORDER — ONDANSETRON HCL 4 MG PO TABS: 4.0000 mg | ORAL_TABLET | ORAL | Status: DC | PRN

## 2013-04-04 MED ORDER — NALOXONE HCL 0.4 MG/ML IJ SOLN: 0.4000 mg | INTRAMUSCULAR | Status: DC | PRN

## 2013-04-04 SURGICAL SUPPLY — 36 items
BARRIER ADHS 3X4 INTERCEED (GAUZE/BANDAGES/DRESSINGS) IMPLANT
CLAMP CORD UMBIL (MISCELLANEOUS) IMPLANT
CLOTH BEACON ORANGE TIMEOUT ST (SAFETY) ×2 IMPLANT
CONTAINER PREFILL 10% NBF 15ML (MISCELLANEOUS) IMPLANT
DRAPE LG THREE QUARTER DISP (DRAPES) IMPLANT
DRSG OPSITE POSTOP 4X10 (GAUZE/BANDAGES/DRESSINGS) ×2 IMPLANT
DURAPREP 26ML APPLICATOR (WOUND CARE) ×2 IMPLANT
ELECT REM PT RETURN 9FT ADLT (ELECTROSURGICAL) ×2
ELECTRODE REM PT RTRN 9FT ADLT (ELECTROSURGICAL) ×1 IMPLANT
EXTRACTOR VACUUM KIWI (MISCELLANEOUS) IMPLANT
GLOVE BIO SURGEON STRL SZ 6.5 (GLOVE) ×2 IMPLANT
GOWN STRL REUS W/TWL LRG LVL3 (GOWN DISPOSABLE) ×4 IMPLANT
KIT ABG SYR 3ML LUER SLIP (SYRINGE) IMPLANT
NEEDLE HYPO 25X5/8 SAFETYGLIDE (NEEDLE) IMPLANT
NEEDLE SPNL 18GX3.5 QUINCKE PK (NEEDLE) ×2 IMPLANT
NS IRRIG 1000ML POUR BTL (IV SOLUTION) ×2 IMPLANT
PACK C SECTION WH (CUSTOM PROCEDURE TRAY) ×2 IMPLANT
PAD ABD 7.5X8 STRL (GAUZE/BANDAGES/DRESSINGS) ×2 IMPLANT
PAD OB MATERNITY 4.3X12.25 (PERSONAL CARE ITEMS) ×2 IMPLANT
SPONGE GAUZE 4X4 12PLY (GAUZE/BANDAGES/DRESSINGS) ×2 IMPLANT
STRIP CLOSURE SKIN 1/2X4 (GAUZE/BANDAGES/DRESSINGS) ×2 IMPLANT
SUT PDS AB 0 CTX 60 (SUTURE) IMPLANT
SUT VIC AB 0 CT1 27 (SUTURE) ×1
SUT VIC AB 0 CT1 27XBRD ANBCTR (SUTURE) ×1 IMPLANT
SUT VIC AB 0 CT1 36 (SUTURE) ×2 IMPLANT
SUT VIC AB 2-0 CT1 27 (SUTURE) ×1
SUT VIC AB 2-0 CT1 TAPERPNT 27 (SUTURE) ×1 IMPLANT
SUT VIC AB 2-0 CTX 36 (SUTURE) ×8 IMPLANT
SUT VIC AB 3-0 CT1 27 (SUTURE) ×1
SUT VIC AB 3-0 CT1 TAPERPNT 27 (SUTURE) ×1 IMPLANT
SUT VIC AB 3-0 SH 27 (SUTURE)
SUT VIC AB 3-0 SH 27X BRD (SUTURE) IMPLANT
SYR 30ML LL (SYRINGE) ×2 IMPLANT
TOWEL OR 17X24 6PK STRL BLUE (TOWEL DISPOSABLE) ×2 IMPLANT
TRAY FOLEY CATH 14FR (SET/KITS/TRAYS/PACK) ×2 IMPLANT
WATER STERILE IRR 1000ML POUR (IV SOLUTION) ×2 IMPLANT

## 2013-04-04 NOTE — Progress Notes (Signed)
I was called to the [facility] u/s room where a patient was found to be footling breech. She was also having contractions for the last 2 hours. Her cervix was found to be 1 /12-2 cm, previously 0.5 cm. NST was reactive. I explained the need for a C/S. All questions were answered. The OR and anesthesia were notified. She was instructed to remain NPO. Orders were placed in EPIC.

## 2013-04-04 NOTE — Anesthesia Postprocedure Evaluation (Signed)
Anesthesia Post Note  Patient: Brittney Sandoval  Procedure(s) Performed: Procedure(s) (LRB): CESAREAN SECTION (N/A)  Anesthesia type: Spinal  Patient location: PACU  Post pain: Pain level controlled  Post assessment: Post-op Vital signs reviewed  Last Vitals:  Filed Vitals:   04/04/13 1559  BP: 140/78  Pulse: 97  Temp:   Resp:     Post vital signs: Reviewed  Level of consciousness: awake  Complications: No apparent anesthesia complications

## 2013-04-04 NOTE — H&P (Signed)
Brittney Sandoval is a 33 y.o. female [phone] with IUP at [redacted]w[redacted]d presenting for c-section for malpresentation. Pt states she has been having regular, every 1-3 minutes contractions, associated with none vaginal bleeding.  Membranes are intact, with active fetal movement.   [facility] since ~20 wks  Prenatal History/Complications: Pelvic mass near butttox, CHTN, footling breech Past Medical History: Past Medical History  Diagnosis Date  . Hypertension     Past Surgical History: Past Surgical History  Procedure Laterality Date  . Laparoscopic cholecystectomy  2010    [place]  . Tonsillectomy      Obstetrical History: OB History   Grav Para Term Preterm Abortions TAB SAB Ect Mult Living   4 3 3       3       Gynecological History: OB History   Grav Para Term Preterm Abortions TAB SAB Ect Mult Living   4 3 3       3       Social History: History   Social History  . Marital Status: Single    Spouse Name: N/A    Number of Children: N/A  . Years of Education: N/A   Social History Main Topics  . Smoking status: Never Smoker   . Smokeless tobacco: Never Used  . Alcohol Use: No  . Drug Use: No  . Sexual Activity: Not Currently    Birth Control/ Protection: None   Other Topics Concern  . None   Social History Narrative   Lives in [place].  Relocated to [place] a few years ago.    Family History: Family History  Problem Relation Age of Onset  . Diabetes Mother   . Hypertension Mother   . Kidney disease Mother   . Heart disease Mother     Allergies: No Known Allergies  Prescriptions prior to admission  Medication Sig Dispense Refill  . metroNIDAZOLE (FLAGYL) 500 MG tablet Take 1 tablet (500 mg total) by mouth 2 (two) times daily.  14 tablet  0  . Prenatal Vit-Fe Fumarate-FA (PRENATAL MULTIVITAMIN) TABS tablet Take 1 tablet by mouth daily at 12 noon.         Review of Systems   Constitutional: No acute issues, no f/c, sob,  n/v, d/c, weakness  Blood pressure 140/83, pulse 89, temperature 98.3 F (36.8 C), resp. rate 18, height 5\' 4"  (1.626 m), weight 102.241 kg (225 lb 6.4 oz), last menstrual period 07/01/2012. General appearance: alert, cooperative, appears stated age and no distress Lungs: clear to auscultation bilaterally Heart: regular rate and rhythm Abdomen: soft, non-tender; bowel sounds normal Extremities: Homans sign is negative, no sign of DVT DTR's 2+ Presentation: breech Fetal monitoringBaseline: 130s bpm, Variability: Good {> 6 bpm), Accelerations: Reactive and Decelerations: Absent Uterine activity 29min  Dilation: 2.5 Station: -2 Exam by:: Dr. Hulan Fray   Prenatal labs: ABO, Rh: A/Positive/-- (07/09 0000) Antibody:   Rubella:   RPR: NON REAC (12/01 1447)  HBsAg:    HIV: NON REACTIVE (12/01 1447)  GBS: Negative (01/12 0000)   Clinic [place]  Dating LMP/Ultrasound:   11 4/7 weeks        Ultrasound consistent with LMP: Yes  Genetic Screen Declined  Anatomic [place] Normal  GTT Early 112 3rd 92  TDaP vaccine 11/06/12  Flu vaccine  declined  GBS  negative  Baby Food Breast  Contraception Nexplanon  Circumcision If female  Pediatrician [place]  Support person: Mother and daughter   Prenatal Transfer Tool  Maternal Diabetes:  No Genetic Screening: Normal Maternal Ultrasounds/Referrals: Normal Fetal Ultrasounds or other Referrals:  None Maternal Substance Abuse:  No Significant Maternal Medications:  none Significant Maternal Lab Results: Lab values include: Group B Strep negative     Results for orders placed during the hospital encounter of 04/04/13 (from the past 24 hour(s))  CBC   Collection Time    04/04/13  3:15 PM      Result Value Range   WBC 7.5  4.0 - 10.5 K/uL   RBC 4.33  3.87 - 5.11 MIL/uL   Hemoglobin 9.2 (*) 12.0 - 15.0 g/dL   HCT 28.0 (*) 36.0 - 46.0 %   MCV 64.7 (*) 78.0 - 100.0 fL   MCH 21.2 (*) 26.0 - 34.0 pg   MCHC 32.9  30.0 - 36.0 g/dL   RDW  16.0 (*) 11.5 - 15.5 %   Platelets 243  150 - 400 K/uL    Assessment: Brittney Sandoval is a 33 y.o. G4P3003 at [redacted]w[redacted]d here for LTCS for malpresentation #Labor: discussed r/b moving to section. #Pain: spinal #FWB: Cat I #ID:  Ancef 2g preop #MOF: breast #MOC: nexplanon   Brittney Sandoval 04/04/2013, 3:53 PM  This procedure has been fully reviewed with the patient and written informed consent has been obtained.

## 2013-04-04 NOTE — Consult Note (Signed)
Neonatology Note:   Attendance at C-section:    I was asked by Dr. Hulan Fray to attend this primary C/S at term due to footling breech presentation. The mother is a G4P3 A pos, GBS neg with an uncomplicated pregnancy other than malpresentation. ROM at delivery, fluid clear. Infant vigorous with good spontaneous cry and tone. Needed only minimal bulb suctioning. Ap 9/10. Lungs clear to ausc in DR. To CN to care of Pediatrician.   Real Cons, MD

## 2013-04-04 NOTE — Anesthesia Preprocedure Evaluation (Signed)
Anesthesia Evaluation  Patient identified by MRN, date of birth, ID band Patient awake    Reviewed: Allergy & Precautions, H&P , NPO status , Patient's Chart, lab work & pertinent test results  Airway Mallampati: III      Dental   Pulmonary  breath sounds clear to auscultation        Cardiovascular Exercise Tolerance: Good hypertension, Rhythm:regular Rate:Normal     Neuro/Psych    GI/Hepatic   Endo/Other  Morbid obesity  Renal/GU      Musculoskeletal   Abdominal   Peds  Hematology   Anesthesia Other Findings   Reproductive/Obstetrics (+) Pregnancy                           Anesthesia Physical Anesthesia Plan  ASA: III and emergent  Anesthesia Plan: Spinal   Post-op Pain Management:    Induction:   Airway Management Planned:   Additional Equipment:   Intra-op Plan:   Post-operative Plan:   Informed Consent: I have reviewed the patients History and Physical, chart, labs and discussed the procedure including the risks, benefits and alternatives for the proposed anesthesia with the patient or authorized representative who has indicated his/her understanding and acceptance.     Plan Discussed with: Anesthesiologist, CRNA and Surgeon  Anesthesia Plan Comments:         Anesthesia Quick Evaluation

## 2013-04-04 NOTE — MAU Note (Signed)
Pt sent up from clinic to be preped for c-section. Baby is breech today. Having some ctx and irratability and SVE 2.5cm dilated.

## 2013-04-04 NOTE — Anesthesia Procedure Notes (Signed)
Spinal  Patient location during procedure: OR Start time: 04/04/2013 4:47 PM Staffing Anesthesiologist: Rudean Curt Performed by: anesthesiologist  Preanesthetic Checklist Completed: patient identified, site marked, surgical consent, pre-op evaluation, timeout performed, IV checked, risks and benefits discussed and monitors and equipment checked Spinal Block Patient position: sitting Prep: DuraPrep Patient monitoring: heart rate, cardiac monitor, continuous pulse ox and blood pressure Approach: midline Location: L3-4 Injection technique: single-shot Needle Needle type: Sprotte  Needle gauge: 24 G Needle length: 9 cm Assessment Sensory level: T4 Additional Notes Patient identified.  Risk benefits discussed including failed block, incomplete pain control, headache, nerve damage, paralysis, blood pressure changes, nausea, vomiting, reactions to medication both toxic or allergic, and postpartum back pain.  Patient expressed understanding and wished to proceed.  All questions were answered.  Sterile technique used throughout procedure.  CSF was clear.  No parasthesia or other complications.  Please see nursing notes for vital signs.

## 2013-04-04 NOTE — Progress Notes (Signed)
P = 96   Dr. Hulan Fray notified of fetal presentation.  Cx exam done.  Pt sent to MAU for C/S prep. Report called to Garner Gavel RN

## 2013-04-04 NOTE — Transfer of Care (Signed)
Immediate Anesthesia Transfer of Care Note  Patient: Brittney Sandoval  Procedure(s) Performed: Procedure(s): CESAREAN SECTION (N/A)  Patient Location: PACU  Anesthesia Type:Spinal  Level of Consciousness: awake, alert  and oriented  Airway & Oxygen Therapy: Patient Spontanous Breathing  Post-op Assessment: Report given to PACU RN and Post -op Vital signs reviewed and stable  Post vital signs: Reviewed and stable  Complications: No apparent anesthesia complications

## 2013-04-04 NOTE — Op Note (Signed)
Janna Arch PROCEDURE DATE: 04/04/2013  PREOPERATIVE DIAGNOSES: Intrauterine pregnancy at  [redacted]w[redacted]d weeks gestation; malpresentation: Footling breech, abdominal mass  POSTOPERATIVE DIAGNOSES: The same  PROCEDURE: Primary Low Transverse Cesarean Section  SURGEON:  Dr. Hulan Fray  ASSISTANT:  Dr. Leslie Andrea  INDICATIONS: Brittney Sandoval is a 33 y.o. [phone] at [redacted]w[redacted]d here for cesarean section secondary to the indications listed under preoperative diagnoses; please see preoperative note for further details.  The risks of cesarean section were discussed with the patient including but were not limited to: bleeding which may require transfusion or reoperation; infection which may require antibiotics; injury to bowel, bladder, ureters or other surrounding organs; injury to the fetus; need for additional procedures including hysterectomy in the event of a life-threatening hemorrhage; placental abnormalities wth subsequent pregnancies, incisional problems, thromboembolic phenomenon and other postoperative/anesthesia complications.   The patient concurred with the proposed plan, giving informed written consent for the procedure.    FINDINGS:  Viable female infant in footling breech presentation.  Apgars 9 and 10.  Clear amniotic fluid.  Intact placenta, three vessel cord.  Normal uterus, fallopian tubes and ovaries bilaterally.  ANESTHESIA: Spinal INTRAVENOUS FLUIDS: 2000 ml ESTIMATED BLOOD LOSS: 700 ml URINE OUTPUT:  250 ml SPECIMENS: Placenta sent to L&D COMPLICATIONS: None immediate  PROCEDURE IN DETAIL:  The patient preoperatively received intravenous antibiotics and had sequential compression devices applied to her lower extremities.  She was then taken to the operating room where spinal anesthesia was administered and was found to be adequate. She was then placed in a dorsal supine position with a leftward tilt, and prepped and draped in a sterile manner.  A foley catheter was placed into her bladder and  attached to constant gravity.  After an adequate timeout was performed, 30 ml of 0.5% Marcaine was injected subcutaneously around the incision site, a Pfannenstiel skin incision was made with scalpel and carried through to the underlying layer of fascia. The fascia was incised in the midline, and this incision was extended bilaterally using the Mayo scissors.  Underlying rectus muscles were dissected off bluntly.  The rectus muscles were separated in the midline bluntly and the peritoneum was entered bluntly. Attention was turned to the lower uterine segment where a low transverse hysterotomy was made with a scalpel and extended bilaterally bluntly.  The infant was successfully delivered breech, the cord was clamped and cut and the infant was handed over to awaiting neonatology team. Uterine massage was then administered, and the placenta delivered intact with a three-vessel cord. The uterus was then cleared of clot and debris.  The hysterotomy was closed with 0 Vicryl in a running locked fashion, and an imbricating layer was also placed with 0 Vicryl. The pelvis was cleared of all clot and debris. Hemostasis was confirmed on all surfaces.  The peritoneum and the muscles were reapproximated using 0 Vicryl interrupted stitches. The fascia was then closed using 0 Vicryl in a running fashion.  The subcutaneous layer was irrigated, then reapproximated with 2-0 plain gut interrupted stitches.  The skin was closed with a 4-0 Vicryl subcuticular stitch. The patient tolerated the procedure well. Sponge, lap, instrument and needle counts were correct x 2.  She was taken to the recovery room in stable condition.    Fredrik Rigger, MD OB Fellow

## 2013-04-05 ENCOUNTER — Encounter (HOSPITAL_COMMUNITY): Payer: Self-pay | Admitting: Obstetrics & Gynecology

## 2013-04-05 LAB — CBC
HCT: 25.3 % — ABNORMAL LOW (ref 36.0–46.0)
Hemoglobin: 8.2 g/dL — ABNORMAL LOW (ref 12.0–15.0)
MCH: 21.2 pg — AB (ref 26.0–34.0)
MCHC: 32.4 g/dL (ref 30.0–36.0)
MCV: 65.5 fL — ABNORMAL LOW (ref 78.0–100.0)
PLATELETS: 211 10*3/uL (ref 150–400)
RBC: 3.86 MIL/uL — ABNORMAL LOW (ref 3.87–5.11)
RDW: 16.2 % — AB (ref 11.5–15.5)
WBC: 9.4 10*3/uL (ref 4.0–10.5)

## 2013-04-05 LAB — HEPATITIS B SURFACE ANTIGEN: Hepatitis B Surface Ag: NEGATIVE

## 2013-04-05 LAB — BIRTH TISSUE RECOVERY COLLECTION (PLACENTA DONATION)

## 2013-04-05 NOTE — Progress Notes (Signed)
UR chart review completed.  

## 2013-04-05 NOTE — Anesthesia Postprocedure Evaluation (Addendum)
Anesthesia Post Note  Patient: Brittney Sandoval  Procedure(s) Performed: Procedure(s) (LRB): CESAREAN SECTION (N/A)  Anesthesia type:Spinal  Patient location: Mother/Baby  Post pain: Pain level controlled  Post assessment: Post-op Vital signs reviewed  Last Vitals:  Filed Vitals:   04/05/13 0809  BP: 179/110  Pulse: 85  Temp:   Resp:     Post vital signs: Reviewed  Level of consciousness:alert  Complications: No apparent anesthesia complications

## 2013-04-05 NOTE — Progress Notes (Signed)
Subjective: Postpartum Day 1: Cesarean Delivery Patient reports incisional pain, tolerating PO and no problems voiding.    Objective: Vital signs in last 24 hours: Temp:  [97.7 F (36.5 C)-98.5 F (36.9 C)] 97.9 F (36.6 C) (02/06 0430) Pulse Rate:  [66-97] 69 (02/06 0430) Resp:  [13-18] 16 (02/06 0430) BP: (115-143)/(58-90) 143/83 mmHg (02/06 0430) SpO2:  [99 %-100 %] 99 % (02/06 0430) Weight:  [102.241 kg (225 lb 6.4 oz)] 102.241 kg (225 lb 6.4 oz) (02/05 1455)  Physical Exam:  General: alert, cooperative and no distress Lochia: appropriate Uterine Fundus: firm Incision: no significant drainage, no significant erythema DVT Evaluation: No evidence of DVT seen on physical exam. No cords or calf tenderness. No significant calf/ankle edema.   Recent Labs  04/04/13 1515 04/05/13 0610  HGB 9.2* 8.2*  HCT 28.0* 25.3*    Assessment/Plan: Status post Cesarean section. Doing well postoperatively.  Continue current care.  Breastfeeding, some difficulty with latch, lactation to see today. Planning nexplanon for contraception  Brittney Sandoval 04/05/2013, 7:50 AM  I have seen and examined this patient and agree the above assessment. [name],Brittney Sandoval 04/05/2013 8:03 AM

## 2013-04-06 NOTE — Progress Notes (Signed)
NST 03/28/13 reactive 

## 2013-04-06 NOTE — Lactation Note (Signed)
This note was copied from the chart of Brittney Sandoval. Lactation Consultation Note  Patient Name: [name] [id number] Date: 04/06/2013 Reason for consult: Follow-up assessment;Difficult latch Asked by RN to see Mom. Baby has not been latching well today. RN reports Mom's aerola is thick, tough, non-compressible and baby cannot sustain a latch. RN set up DEBP for Mom to pre-pump per [facility] recommendations. Mom was pre-pumping when I arrived. Baby STS. Mom's left nipple has colostrum present, the pumping is softening the nipple/aerola, reverse pressure massage use to soften the aerola even more which resulted in the nipple/aerola becoming compressible. After spoon feeding the baby approx 2 ml of colostrum baby was awake and interested in BF.  With sandwiching Mom's nipple baby latched after several attempts and sustained the latch for 15 minutes. Demonstrated a good suckling pattern with audible swallows. Mom's right nipple was the same, after pre-pumping and reverse pressure massage nipple/aerola was more compressible but larger than left.  Baby did not latch at this visit to the right breast. Mom placed baby STS. Mom reports baby was latching well last evening till this afternoon and then she noticed her nipple/aerolas became firm and baby was unable to latch. Advised Mom to pre-pump and use reverse pressure massage to help soften the aerola and make the nipple compressible. If baby cannot latch ask the RN to initiate a nipple shield. Ask for assist as needed with feedings.   Maternal Data    Feeding Feeding Type: Breast Fed Length of feed: 15 min  LATCH Score/Interventions Latch: Repeated attempts needed to sustain latch, nipple held in mouth throughout feeding, stimulation needed to elicit sucking reflex. Intervention(s): Skin to skin;Waking techniques Intervention(s): Adjust position;Assist with latch;Breast massage;Breast compression  Audible Swallowing: Spontaneous and  intermittent Intervention(s): Skin to skin  Type of Nipple: Everted at rest and after stimulation (thick, tough aerola, reverse pressure massage,pre-pump)  Comfort (Breast/Nipple): Soft / non-tender     Hold (Positioning): Assistance needed to correctly position infant at breast and maintain latch. Intervention(s): Breastfeeding basics reviewed;Support Pillows;Skin to skin  LATCH Score: 8  Lactation Tools Discussed/Used Tools: Pump Breast pump type: Double-Electric Breast Pump   Consult Status Consult Status: Follow-up Date: 04/07/13 Follow-up type: In-patient    Katrine Coho 04/06/2013, 11:10 PM

## 2013-04-06 NOTE — Discharge Summary (Signed)
I have seen and examined this patient and I agree with the above. Pt is requesting an additional day's stay- feels she is having incisional discomfort that she would be more comfortable managing as an inpt. Anticipate d/c in AM 04/07/13. Serita Grammes 7:50 AM 04/06/2013

## 2013-04-06 NOTE — Discharge Summary (Signed)
  Obstetric Discharge Summary Reason for Admission: cesarean section Prenatal Procedures: Ultrasound Intrapartum Procedures: cesarean: low cervical, transverse Postpartum Procedures: none Complications-Operative and Postpartum: none Hemoglobin  Date Value Range Status  04/05/2013 8.2* 12.0 - 15.0 g/dL Final     HCT  Date Value Range Status  04/05/2013 25.3* 36.0 - 46.0 % Final   Hospital Course: Brittney Sandoval is a 33 y.o. [id number] who presented to the high risk clinic at [redacted]w[redacted]d with ctx, cervical change and a finding of footling breech on u/s. Pt had a healthy singleton female via LTCS. Uncomplicated intraoperative and Post Operative course  Physical Exam:  General: alert, cooperative and mild distress Lochia: appropriate Uterine Fundus: firm Incision: healing well, no significant drainage DVT Evaluation: No evidence of DVT seen on physical exam. Negative Homan's sign.  Discharge Diagnoses: Term Pregnancy-delivered  Discharge Information: Date: 04/06/2013 Activity: pelvic rest Diet: routine Medications: Colace, Iron and Percocet Condition: stable Instructions: refer to practice specific booklet Discharge to: home   Newborn Data: Live born female  Birth Weight: 6 lb 5.8 oz (2885 g) APGAR: 9, 10  Home with mother.  Cherlyn Roberts 04/06/2013, 7:43 AM

## 2013-04-07 ENCOUNTER — Inpatient Hospital Stay (HOSPITAL_COMMUNITY): Admission: RE | Admit: 2013-04-07 | Payer: Medicaid Other | Source: Ambulatory Visit

## 2013-04-07 MED ORDER — OXYCODONE-ACETAMINOPHEN 5-325 MG PO TABS: 1.0000 | ORAL_TABLET | ORAL | Status: DC | PRN

## 2013-04-07 MED ORDER — INTEGRA F 125-1 MG PO CAPS: 1.0000 | ORAL_CAPSULE | Freq: Every day | ORAL | Status: DC

## 2013-04-07 MED ORDER — IBUPROFEN 600 MG PO TABS: 600.0000 mg | ORAL_TABLET | Freq: Four times a day (QID) | ORAL | Status: DC

## 2013-04-07 NOTE — Lactation Note (Signed)
This note was copied from the chart of [place]. Lactation Consultation Note: Follow up visit with mom before DC. Mom reports that baby has been sleepy through the night and is only nursing for 5 minutes then goes off to sleep. When I went into room, baby awake and fussy. After a few attempts, baby latched well and nursed for 18 minutes, then off to sleep. Mom reports that he is only latching onto the left breast, Encouraged to nurse on right breast also. Has manual pump for home at present. States she will get electric pump soon. No questions at present. To call prn  Patient Name: Brittney Sandoval [id number] Date: 04/07/2013 Reason for consult: Follow-up assessment   Maternal Data    Feeding Feeding Type: Breast Fed Length of feed: 18 min  LATCH Score/Interventions Latch: Grasps breast easily, tongue down, lips flanged, rhythmical sucking.  Audible Swallowing: A few with stimulation  Type of Nipple: Everted at rest and after stimulation  Comfort (Breast/Nipple): Soft / non-tender     Hold (Positioning): Assistance needed to correctly position infant at breast and maintain latch. Intervention(s): Breastfeeding basics reviewed;Support Pillows  LATCH Score: 8  Lactation Tools Discussed/Used     Consult Status Consult Status: Complete    Truddie Crumble 04/07/2013, 8:49 AM

## 2013-04-07 NOTE — Discharge Summary (Signed)
  Obstetric Discharge Summary Reason for Admission: cesarean section secondary to malpresentation (breech) and mass (buttocks) Prenatal Procedures: Ultrasound Intrapartum Procedures: cesarean: low cervical, transverse Postpartum Procedures: none Complications-Operative and Postpartum: none Hemoglobin  Date Value Range Status  04/05/2013 8.2* 12.0 - 15.0 g/dL Final     HCT  Date Value Range Status  04/05/2013 25.3* 36.0 - 46.0 % Final   Hospital Course: Brittney Sandoval is a 33 y.o. [id number] who presented to the high risk clinic at [redacted]w[redacted]d with ctx, cervical change and a finding of footling breech on u/s. Pt had a healthy singleton female via LTCS. Uncomplicated intraoperative and Post Operative course  Physical Exam:  Filed Vitals:   04/07/13 0522  BP: 117/74  Pulse: 82  Temp: 97.5 F (36.4 C)  Resp: 18    General: alert, cooperative and appears stated age CVS:  RRR, without murmur, gallops, or rubs Lungs:  CTA bilat ABD:  +BSx4, normal Lochia: appropriate Uterine Fundus: firm Incision: no significant drainage, dressing clean, dry, and intact DVT Evaluation: No evidence of DVT seen on physical exam. Negative Homan's sign.   Discharge Diagnoses: Term Pregnancy-delivered and Anemia  Discharge Information: Date: 04/07/2013 Activity: pelvic rest Diet: routine Medications: Colace, Iron and Percocet Condition: stable Instructions: refer to practice specific booklet Discharge to: home   Newborn Data: Live born female  Birth Weight: 6 lb 5.8 oz (2885 g) APGAR: 9, 10  Home with mother.  [facility] 04/07/2013, 6:42 AM

## 2013-04-07 NOTE — Discharge Instructions (Signed)

## 2013-04-07 NOTE — Lactation Note (Signed)
This note was copied from the chart of [place]. Lactation Consultation Note: Called by RN to assist with latch. Reports that baby is very fussy. Baby asleep on mom's chest when I went into room. Offered assist with latch to right breast before DC. Mom eager to go home and states baby is too sleepy to latch now. States she feels comfortable with latching baby. Discussed NS with mom but she does not want to try it stating she does not want him to get dependent on it. IN shells given to mom with instructions for use and she put them on now. Mom very ready to go home and does not want to stay for another feeding. States she will call Ped if she feels he is not eating well. No questions at present.  Patient Name: Brittney Sandoval [id number] Date: 04/07/2013 Reason for consult: Follow-up assessment   Maternal Data    Feeding   LATCH Score/Interventions     Lactation Tools Discussed/Used     Consult Status Consult Status: Complete    Truddie Crumble 04/07/2013, 11:42 AM

## 2013-04-11 ENCOUNTER — Telehealth: Payer: Self-pay | Admitting: Obstetrics & Gynecology

## 2013-04-11 DIAGNOSIS — O10019 Pre-existing essential hypertension complicating pregnancy, unspecified trimester: Secondary | ICD-10-CM

## 2013-04-11 MED ORDER — HYDROCHLOROTHIAZIDE 25 MG PO TABS: 25.0000 mg | ORAL_TABLET | Freq: Every day | ORAL | Status: DC

## 2013-04-11 NOTE — Telephone Encounter (Signed)
[facility] OB/GYN Attending Phone Call Documentation  I received a call from Brittney Sandoval regarding Brittney Sandoval, with report of elevated BP in 150s/90-100s and headache.  Patient has a history of CHTN, did not have preeclampsia this pregnancy, but had it during precious pregnancies.  I called the patient at home; she denies any current headache, visual symptoms, N/V, RUQ/epigastric pain; reports having the headache yesterday.   Given her history and concern about superimposed preeclampsia, she was told to go the [facility] for evaluation and management.    Patient then called back saying she could not come to the MAU, she asked to be restarted on her pre-pregnancy dose of HCTZ.  She was advised that it takes about a day for HCTZ levels to be stable in her system, her condition could worsen and lead to seizures, stroke or other morbid conditions.  She verbalized understanding of these concerns; says she will come in if her symptoms get worse.  Follow up appointment scheduled in the clinic on 04/15/13 at 10 am for BP check; she was strongly advised to come in to MAU earlier for any concerning/worsening symptoms.  HCTZ 25 mg daily, e-prescribed, patient advised to go pick up prescription.   Verita Schneiders, MD, [facility] Attending [place], [place]

## 2013-04-15 ENCOUNTER — Ambulatory Visit (INDEPENDENT_AMBULATORY_CARE_PROVIDER_SITE_OTHER): Payer: Medicaid Other | Admitting: Family Medicine

## 2013-04-15 MED ORDER — POLYETHYLENE GLYCOL 3350 17 GM/SCOOP PO POWD: 17.0000 g | Freq: Once | ORAL | Status: DC

## 2013-04-15 NOTE — Progress Notes (Signed)
Subjective:     Brittney Sandoval is a 33 y.o. female who presents for a postpartum visit. She is 2 weeks postpartum following a primary low cervical transverse Cesarean section for breech. I have fully reviewed the prenatal and intrapartum course. The delivery was at 39+4 gestational weeks. Outcome: primary cesarean section, low transverse incisionbreech presentation. Anesthesia: spinal. Postpartum course has been complicated for chronic BP and was on HCTZ previously and restarted 25mg  PP. Mildly elevated today (not taken med).   Pt also complaining of skin breakdown, around site of drainage of cyst. Noticed several days ago, pt had "Horrific" case of constipation. Taking colace and miralax. Did not improve with enema. But noticed skin breakdown. Pt has been placing vasoline on it.  Baby's course has been uncomplicated. Baby is feeding by breast. Bleeding thin lochia. Bowel function is constipated as above. Bladder function is normal. Patient is not sexually active. Contraception method is Nexplanon. Postpartum depression screening: negative.  The following portions of the patient's history were reviewed and updated as appropriate: allergies, current medications, past family history, past medical history, past social history, past surgical history and problem list.  Review of Systems Pertinent items are noted in HPI.   Objective:    BP 137/93  Pulse 75  LMP 07/01/2012  General:  alert, cooperative and no distress   Breasts:  no acute issues. decline exam  Lungs: clear to auscultation bilaterally and normal percussion bilaterally  Heart:  regular rate and rhythm, S1, S2 normal, no murmur, click, rub or gallop and normal apical impulse  Abdomen: soft, non-tender; bowel sounds normal; no masses,  no organomegaly, well healing incision. Steri strip in place  Rectal Exam: no masses, tenderness, nodules, empty cyst,         Assessment:     2 week postpartum exam. Pap smear not done at today's  visit.   Plan:    1. Contraception: abstenence for 2 weeks - place nexplanon in 2 weeks 2. Return for reeval in 2 weeks 3. Follow up in: 2 week or as needed.  4. BP at goal on meds - needs to continue. reeval in 2 weeks 5. constpiation : start miralax and colace.   F/u 2weeks

## 2013-04-15 NOTE — Progress Notes (Signed)
Having some trouble with her backside where the pelvic cyst was drained. Would like someone to take a look at it. Worried that it may be infected. Has not taken her HCTZ today. Also has complaints of hemorrhoids. Patient switched to provider schedule for check of drainage site.

## 2013-04-15 NOTE — Patient Instructions (Signed)
Postpartum Care After Cesarean Delivery °After you deliver your newborn (postpartum period), the usual stay in the hospital is 24 72 hours. If there were problems with your labor or delivery, or if you have other medical problems, you might be in the hospital longer.  °While you are in the hospital, you will receive help and instructions on how to care for yourself and your newborn during the postpartum period.  °While you are in the hospital: °· It is normal for you to have pain or discomfort from the incision in your abdomen. Be sure to tell your nurses when you are having pain, where the pain is located, and what makes the pain worse. °· If you are breastfeeding, you may feel uncomfortable contractions of your uterus for a couple of weeks. This is normal. The contractions help your uterus get back to normal size. °· It is normal to have some bleeding after delivery. °· For the first 1 3 days after delivery, the flow is red and the amount may be similar to a period. °· It is common for the flow to start and stop. °· In the first few days, you may pass some small clots. Let your nurses know if you begin to pass large clots or your flow increases. °· Do not  flush blood clots down the toilet before having the nurse look at them. °· During the next 3 10 days after delivery, your flow should become more watery and pink or brown-tinged in color. °· Ten to fourteen days after delivery, your flow should be a small amount of yellowish-white discharge. °· The amount of your flow will decrease over the first few weeks after delivery. Your flow may stop in 6 8 weeks. Most women have had their flow stop by 12 weeks after delivery. °· You should change your sanitary pads frequently. °· Wash your hands thoroughly with soap and water for at least 20 seconds after changing pads, using the toilet, or before holding or feeding your newborn. °· Your intravenous (IV) tubing will be removed when you are drinking enough fluids. °· The  urine drainage tube (urinary catheter) that was inserted before delivery may be removed within 6 8 hours after delivery or when feeling returns to your legs. You should feel like you need to empty your bladder within the first 6 8 hours after the catheter has been removed. °· In case you become weak, lightheaded, or faint, call your nurse before you get out of bed for the first time and before you take a shower for the first time. °· Within the first few days after delivery, your breasts may begin to feel tender and full. This is called engorgement. Breast tenderness usually goes away within 48 72 hours after engorgement occurs. You may also notice milk leaking from your breasts. If you are not breastfeeding, do not stimulate your breasts. Breast stimulation can make your breasts produce more milk. °· Spending as much time as possible with your newborn is very important. During this time, you and your newborn can feel close and get to know each other. Having your newborn stay in your room (rooming in) will help to strengthen the bond with your newborn. It will give you time to get to know your newborn and become comfortable caring for your newborn. °· Your hormones change after delivery. Sometimes the hormone changes can temporarily cause you to feel sad or tearful. These feelings should not last more than a few days. If these feelings last longer   than that, you should talk to your caregiver. °· If desired, talk to your caregiver about methods of family planning or contraception. °· Talk to your caregiver about immunizations. Your caregiver may want you to have the following immunizations before leaving the hospital: °· Tetanus, diphtheria, and pertussis (Tdap) or tetanus and diphtheria (Td) immunization. It is very important that you and your family (including grandparents) or others caring for your newborn are up-to-date with the Tdap or Td immunizations. The Tdap or Td immunization can help protect your newborn  from getting ill. °· Rubella immunization. °· Varicella (chickenpox) immunization. °· Influenza immunization. You should receive this annual immunization if you did not receive the immunization during your pregnancy. °Document Released: 11/09/2011 Document Reviewed: 11/09/2011 °[employer]® Patient Information ©2014 [employer]. ° °

## 2013-04-29 ENCOUNTER — Ambulatory Visit: Payer: Medicaid Other | Admitting: Obstetrics & Gynecology

## 2013-05-17 ENCOUNTER — Ambulatory Visit (INDEPENDENT_AMBULATORY_CARE_PROVIDER_SITE_OTHER): Payer: Medicaid Other | Admitting: Obstetrics and Gynecology

## 2013-05-17 ENCOUNTER — Ambulatory Visit: Payer: Medicaid Other | Admitting: Nurse Practitioner

## 2013-05-17 ENCOUNTER — Encounter: Payer: Self-pay | Admitting: Obstetrics and Gynecology

## 2013-05-17 DIAGNOSIS — B373 Candidiasis of vulva and vagina: Secondary | ICD-10-CM

## 2013-05-17 DIAGNOSIS — B3731 Acute candidiasis of vulva and vagina: Secondary | ICD-10-CM

## 2013-05-17 MED ORDER — FLUCONAZOLE 150 MG PO TABS: 150.0000 mg | ORAL_TABLET | Freq: Once | ORAL | Status: DC

## 2013-05-17 NOTE — Patient Instructions (Addendum)
Hypertension As your heart beats, it forces blood through your arteries. This force is your blood pressure. If the pressure is too high, it is called hypertension (HTN) or high blood pressure. HTN is dangerous because you may have it and not know it. High blood pressure may mean that your heart has to work harder to pump blood. Your arteries may be narrow or stiff. The extra work puts you at risk for heart disease, stroke, and other problems.  Blood pressure consists of two numbers, a higher number over a lower, 110/72, for example. It is stated as "110 over 72." The ideal is below 120 for the top number (systolic) and under 80 for the bottom (diastolic). Write down your blood pressure today. You should pay close attention to your blood pressure if you have certain conditions such as:  Heart failure.  Prior heart attack.  Diabetes  Chronic kidney disease.  Prior stroke.  Multiple risk factors for heart disease. To see if you have HTN, your blood pressure should be measured while you are seated with your arm held at the level of the heart. It should be measured at least twice. A one-time elevated blood pressure reading (especially in the Emergency Department) does not mean that you need treatment. There may be conditions in which the blood pressure is different between your right and left arms. It is important to see your caregiver soon for a recheck. Most people have essential hypertension which means that there is not a specific cause. This type of high blood pressure may be lowered by changing lifestyle factors such as:  Stress.  Smoking.  Lack of exercise.  Excessive weight.  Drug/tobacco/alcohol use.  Eating less salt. Most people do not have symptoms from high blood pressure until it has caused damage to the body. Effective treatment can often prevent, delay or reduce that damage. TREATMENT  When a cause has been identified, treatment for high blood pressure is directed at the  cause. There are a large number of medications to treat HTN. These fall into several categories, and your caregiver will help you select the medicines that are best for you. Medications may have side effects. You should review side effects with your caregiver. If your blood pressure stays high after you have made lifestyle changes or started on medicines,   Your medication(s) may need to be changed.  Other problems may need to be addressed.  Be certain you understand your prescriptions, and know how and when to take your medicine.  Be sure to follow up with your caregiver within the time frame advised (usually within two weeks) to have your blood pressure rechecked and to review your medications.  If you are taking more than one medicine to lower your blood pressure, make sure you know how and at what times they should be taken. Taking two medicines at the same time can result in blood pressure that is too low. SEEK IMMEDIATE MEDICAL CARE IF:  You develop a severe headache, blurred or changing vision, or confusion.  You have unusual weakness or numbness, or a faint feeling.  You have severe chest or abdominal pain, vomiting, or breathing problems. MAKE SURE YOU:   Understand these instructions.  Will watch your condition.  Will get help right away if you are not doing well or get worse. Document Released: 02/14/2005 Document Revised: 05/09/2011 Document Reviewed: 10/05/2007 [facility] Patient Information 2014 [place]. Monilial Vaginitis Vaginitis in a soreness, swelling and redness (inflammation) of the vagina and vulva. Monilial  vaginitis is not a sexually transmitted infection. CAUSES  Yeast vaginitis is caused by yeast (candida) that is normally found in your vagina. With a yeast infection, the candida has overgrown in number to a point that upsets the chemical balance. SYMPTOMS   White, thick vaginal discharge.  Swelling, itching, redness and irritation of the vagina and  possibly the lips of the vagina (vulva).  Burning or painful urination.  Painful intercourse. DIAGNOSIS  Things that may contribute to monilial vaginitis are:  Postmenopausal and virginal states.  Pregnancy.  Infections.  Being tired, sick or stressed, especially if you had monilial vaginitis in the past.  Diabetes. Good control will help lower the chance.  Birth control pills.  Tight fitting garments.  Using bubble bath, feminine sprays, douches or deodorant tampons.  Taking certain medications that kill germs (antibiotics).  Sporadic recurrence can occur if you become ill. TREATMENT  Your caregiver will give you medication.  There are several kinds of anti monilial vaginal creams and suppositories specific for monilial vaginitis. For recurrent yeast infections, use a suppository or cream in the vagina 2 times a week, or as directed.  Anti-monilial or steroid cream for the itching or irritation of the vulva may also be used. Get your caregiver's permission.  Painting the vagina with methylene blue solution may help if the monilial cream does not work.  Eating yogurt may help prevent monilial vaginitis. HOME CARE INSTRUCTIONS   Finish all medication as prescribed.  Do not have sex until treatment is completed or after your caregiver tells you it is okay.  Take warm sitz baths.  Do not douche.  Do not use tampons, especially scented ones.  Wear cotton underwear.  Avoid tight pants and panty hose.  Tell your sexual partner that you have a yeast infection. They should go to their caregiver if they have symptoms such as mild rash or itching.  Your sexual partner should be treated as well if your infection is difficult to eliminate.  Practice safer sex. Use condoms.  Some vaginal medications cause latex condoms to fail. Vaginal medications that harm condoms are:  Cleocin cream.  Butoconazole (Femstat).  Terconazole (Terazol) vaginal  suppository.  Miconazole (Monistat) (may be purchased over the counter). SEEK MEDICAL CARE IF:   You have a temperature by mouth above 102 F (38.9 C).  The infection is getting worse after 2 days of treatment.  The infection is not getting better after 3 days of treatment.  You develop blisters in or around your vagina.  You develop vaginal bleeding, and it is not your menstrual period.  You have pain when you urinate.  You develop intestinal problems.  You have pain with sexual intercourse. Document Released: 11/24/2004 Document Revised: 05/09/2011 Document Reviewed: 08/08/2008 [facility] Patient Information 2014 [place], [place].

## 2013-05-17 NOTE — Progress Notes (Signed)
  Subjective:     Brittney Sandoval is a 33 y.o. female [phone] who presents for a postpartum visit. She is 6 weeks postpartum following a low cervical transverse Cesarean section. I have fully reviewed the prenatal and intrapartum course. The delivery was at 39.4 gestational weeks. Outcome: primary cesarean section, low transverse incision. Anesthesia: spinal. Postpartum course has been uncomplicated. Baby's course has been essentially uncomplicated, being treated for facial eczema. Baby is feeding by both breast and bottle - Enfamil AR. Bleeding no bleeding. Bowel function is normal.Constipation has resolved.  Bladder function is normal. Patient is not sexually active. Contraception method is abstinence. Postpartum depression screening: negative.  The following portions of the patient's history were reviewed and updated as appropriate: allergies, current medications, past family history, past medical history, past social history, past surgical history and problem list. Had planned Nexplanon but no partner now and wants to wait. Will call here when ready to get contraception.   Review of Systems Pertinent items are noted in HPI. Cyst drainage site (done at 36wks) healed now  Objective:    BP 128/87  Pulse 74  Ht 5\' 4"  (1.626 m)  Wt 202 lb 1.6 oz (91.672 kg)  BMI 34.67 kg/m2  Breastfeeding? Yes  General:  alert, cooperative and no distress   Breasts:  inspection negative, no nipple discharge or bleeding, no masses or nodularity palpable  Lungs: clear to auscultation bilaterally  Heart:  regular rate and rhythm, S1, S2 normal, no murmur, click, rub or gallop  Abdomen: soft, non-tender; bowel sounds normal; no masses,  no organomegaly   Vulva:  normal  Vagina: normal vagina  Cervix:  no cervical motion tenderness  Corpus: normal size, contour, position, consistency, mobility, non-tender  Adnexa:  not evaluated  Rectal Exam: Not performed.       Pfannensteil incision well healed      6  wk postpartum exam. Pap smear not done at today's visit.  Pelvic cyst  Plan:    1. Contraception: abstinence 2. WP sent. Rx Diflucan 3. Follow up in: 1 year or as needed. Will call when ready for Nexplanon. Will F/U with Gen Surg at 3 months re pelvic cyst removal/

## 2013-05-18 LAB — WET PREP, GENITAL
Clue Cells Wet Prep HPF POC: NONE SEEN
TRICH WET PREP: NONE SEEN
WBC WET PREP: NONE SEEN
Yeast Wet Prep HPF POC: NONE SEEN

## 2013-05-21 ENCOUNTER — Telehealth: Payer: Self-pay | Admitting: *Deleted

## 2013-05-21 NOTE — Telephone Encounter (Signed)
Patient called nurse line for results from Friday 03/20.  Contacted patient and gave patient results.  Patient has no further questions.

## 2013-08-01 ENCOUNTER — Other Ambulatory Visit: Payer: Self-pay | Admitting: Obstetrics & Gynecology

## 2013-11-20 ENCOUNTER — Institutional Professional Consult (permissible substitution): Payer: Self-pay | Admitting: Obstetrics

## 2013-12-30 ENCOUNTER — Encounter: Payer: Self-pay | Admitting: Obstetrics and Gynecology

## 2014-01-22 ENCOUNTER — Other Ambulatory Visit: Payer: Self-pay | Admitting: Obstetrics & Gynecology

## 2014-01-29 ENCOUNTER — Encounter: Payer: Self-pay | Admitting: Obstetrics & Gynecology

## 2014-04-01 ENCOUNTER — Emergency Department (HOSPITAL_COMMUNITY)
Admission: EM | Admit: 2014-04-01 | Discharge: 2014-04-01 | Disposition: A | Discharge status: Home / Self Care | Payer: Medicaid Other | Attending: Emergency Medicine | Admitting: Emergency Medicine

## 2014-04-01 ENCOUNTER — Encounter (HOSPITAL_COMMUNITY): Payer: Self-pay | Admitting: Emergency Medicine

## 2014-04-01 DIAGNOSIS — I10 Essential (primary) hypertension: Secondary | ICD-10-CM | POA: Diagnosis not present

## 2014-04-01 DIAGNOSIS — T464X5A Adverse effect of angiotensin-converting-enzyme inhibitors, initial encounter: Secondary | ICD-10-CM | POA: Insufficient documentation

## 2014-04-01 DIAGNOSIS — J029 Acute pharyngitis, unspecified: Secondary | ICD-10-CM | POA: Diagnosis not present

## 2014-04-01 DIAGNOSIS — Z79899 Other long term (current) drug therapy: Secondary | ICD-10-CM | POA: Diagnosis not present

## 2014-04-01 DIAGNOSIS — T50905A Adverse effect of unspecified drugs, medicaments and biological substances, initial encounter: Secondary | ICD-10-CM

## 2014-04-01 NOTE — ED Notes (Signed)
Pt states she has had a recurrent sore throat with a cough starting 2 days ago. Alert and oriented.

## 2014-04-01 NOTE — ED Provider Notes (Signed)
CSN: [id number]     Arrival date & time 04/01/14  2229 History  This chart was scribed for non-physician practitioner working with Orpah Greek, * by Mercy Moore, ED Scribe. This patient was seen in room WTR8/WTR8 and the patient's care was started at 10:47 PM.   Chief Complaint  Patient presents with  . Cough  . Sore Throat   The history is provided by the patient.   HPI Comments: Brittney Sandoval is a 34 y.o. female with PMHx of Hypertension who presents to the Emergency Department complaining of cough for two days. Patient reports intermittent sore throat, fro some time. Patient reports history of allergies and childhood asthma. Patient states that she's taken allergy medication, but wasn't it effective.   Patient reports starting Lisinopril one month ago and she was told cough is a potential side effect. Patient admits that she's doesn't take it as directed. Patient last took her medication yesterday.  Describes the cough as dry hackey without provocation   Past Medical History  Diagnosis Date  . Hypertension    Past Surgical History  Procedure Laterality Date  . Laparoscopic cholecystectomy  2010    [place], [place]  . Tonsillectomy    . Cesarean section N/A 04/04/2013    Procedure: CESAREAN SECTION;  Surgeon: Emily Filbert, MD;  Location: [facility];  Service: Obstetrics;  Laterality: N/A;   Family History  Problem Relation Age of Onset  . Diabetes Mother   . Hypertension Mother   . Kidney disease Mother   . Heart disease Mother    History  Substance Use Topics  . Smoking status: Never Smoker   . Smokeless tobacco: Never Used  . Alcohol Use: No   OB History    Gravida Para Term Preterm AB TAB SAB Ectopic Multiple Living   4 4 4       4      Review of Systems  Constitutional: Negative for fever and chills.  HENT: Positive for postnasal drip and sore throat. Negative for rhinorrhea and trouble swallowing.   Respiratory: Positive for cough. Negative  for shortness of breath and wheezing.    Allergies  Review of patient's allergies indicates no known allergies.  Home Medications   Prior to Admission medications   Medication Sig Start Date End Date Taking? Authorizing Provider  calcium carbonate (TUMS - DOSED IN MG ELEMENTAL CALCIUM) 500 MG chewable tablet Chew 1 tablet by mouth daily.    Historical Provider, MD  Fe Fum-FePoly-FA-Vit C-Vit B3 (INTEGRA F) 125-1 MG CAPS Take 1 tablet by mouth daily. 04/07/13   Gwen Pounds, CNM  fluconazole (DIFLUCAN) 150 MG tablet Take 1 tablet (150 mg total) by mouth once. 05/17/13   Deirdre C Poe, CNM  hydrochlorothiazide (HYDRODIURIL) 25 MG tablet TAKE 1 TABLET BY MOUTH DAILY 01/22/14   Osborne Oman, MD  ibuprofen (ADVIL,MOTRIN) 600 MG tablet Take 1 tablet (600 mg total) by mouth every 6 (six) hours. 04/07/13   Gwen Pounds, CNM  metroNIDAZOLE (FLAGYL) 500 MG tablet Take 1 tablet (500 mg total) by mouth 2 (two) times daily. 04/01/13   Osborne Oman, MD  oxyCODONE-acetaminophen (PERCOCET/ROXICET) 5-325 MG per tablet Take 1-2 tablets by mouth every 4 (four) hours as needed for severe pain (moderate - severe pain). 04/07/13   Gwen Pounds, CNM  polyethylene glycol powder (MIRALAX) powder Take 17 g by mouth once. 04/15/13   Allen Norris, MD  Prenatal Vit-Fe Fumarate-FA (PRENATAL MULTIVITAMIN) TABS tablet Take 1  tablet by mouth daily at 12 noon.    Historical Provider, MD   Triage Vitals: BP 124/70 mmHg  Pulse 73  Temp(Src) 97.6 F (36.4 C) (Oral)  SpO2 100%  LMP 03/23/2014 (Exact Date) Physical Exam  Constitutional: She is oriented to person, place, and time. She appears well-developed and well-nourished. No distress.  HENT:  Head: Normocephalic and atraumatic.  Mouth/Throat: Uvula is midline. No uvula swelling. No oropharyngeal exudate, posterior oropharyngeal edema, posterior oropharyngeal erythema or tonsillar abscesses.  Eyes: EOM are normal.  Neck: Normal range of motion. Neck supple.   Cardiovascular: Normal rate.   Pulmonary/Chest: Effort normal and breath sounds normal. No respiratory distress. She has no wheezes.  Musculoskeletal: Normal range of motion.  Neurological: She is alert and oriented to person, place, and time.  Skin: Skin is warm and dry.  Psychiatric: She has a normal mood and affect. Her behavior is normal.  Nursing note and vitals reviewed.   ED Course  Procedures (including critical care time)  COORDINATION OF CARE: 10:51 PM- Discussed treatment plan with patient at bedside and patient agreed to plan.   Labs Review Labs Reviewed - No data to display  Imaging Review No results found.   EKG Interpretation None     recommend stopping Lisinopril, make appointment with PCP fro alternative MDM   Final diagnoses:  None   I personally performed the services described in this documentation, which was scribed in my presence. The recorded information has been reviewed and is accurate.   Garald Balding, NP 04/01/14 9191  Orpah Greek, MD 04/02/14 [phone]

## 2014-04-01 NOTE — Discharge Instructions (Signed)
Stop taking the lisinopril call your PCP in the morning the set an appointment to discuss alternative antihypertensive treatment

## 2014-09-30 ENCOUNTER — Emergency Department (HOSPITAL_COMMUNITY)
Admission: EM | Admit: 2014-09-30 | Discharge: 2014-09-30 | Disposition: A | Discharge status: Home / Self Care | Payer: Medicaid Other | Attending: Emergency Medicine | Admitting: Emergency Medicine

## 2014-09-30 ENCOUNTER — Encounter (HOSPITAL_COMMUNITY): Payer: Self-pay | Admitting: Emergency Medicine

## 2014-09-30 DIAGNOSIS — I10 Essential (primary) hypertension: Secondary | ICD-10-CM | POA: Diagnosis not present

## 2014-09-30 DIAGNOSIS — R1013 Epigastric pain: Secondary | ICD-10-CM | POA: Diagnosis present

## 2014-09-30 DIAGNOSIS — R112 Nausea with vomiting, unspecified: Secondary | ICD-10-CM | POA: Diagnosis not present

## 2014-09-30 DIAGNOSIS — R197 Diarrhea, unspecified: Secondary | ICD-10-CM | POA: Insufficient documentation

## 2014-09-30 DIAGNOSIS — Z79899 Other long term (current) drug therapy: Secondary | ICD-10-CM | POA: Insufficient documentation

## 2014-09-30 DIAGNOSIS — Z3202 Encounter for pregnancy test, result negative: Secondary | ICD-10-CM | POA: Diagnosis not present

## 2014-09-30 LAB — I-STAT BETA HCG BLOOD, ED (MC, WL, AP ONLY): I-stat hCG, quantitative: <5 m[IU]/mL (ref <5)

## 2014-09-30 LAB — COMPREHENSIVE METABOLIC PANEL
ALT: 16 U/L (ref 14–54)
ANION GAP: 5 (ref 5–15)
AST: 16 U/L (ref 15–41)
Albumin: 4.3 g/dL (ref 3.5–5.0)
Alkaline Phosphatase: 59 U/L (ref 38–126)
BILIRUBIN TOTAL: 0.4 mg/dL (ref 0.3–1.2)
BUN: 12 mg/dL (ref 6–20)
CO2: 25 mmol/L (ref 22–32)
Calcium: 9.6 mg/dL (ref 8.9–10.3)
Chloride: 108 mmol/L (ref 101–111)
Creatinine, Ser: 0.8 mg/dL (ref 0.44–1.00)
GFR calc non Af Amer: >60 mL/min (ref >60)
GLUCOSE: 96 mg/dL (ref 65–99)
Potassium: 4 mmol/L (ref 3.5–5.1)
Sodium: 138 mmol/L (ref 135–145)
Total Protein: 8.5 g/dL — ABNORMAL HIGH (ref 6.5–8.1)

## 2014-09-30 LAB — CBC
HCT: 35 % — ABNORMAL LOW (ref 36.0–46.0)
HEMOGLOBIN: 11.1 g/dL — AB (ref 12.0–15.0)
MCH: 20.8 pg — AB (ref 26.0–34.0)
MCHC: 31.7 g/dL (ref 30.0–36.0)
MCV: 65.7 fL — ABNORMAL LOW (ref 78.0–100.0)
PLATELETS: 314 10*3/uL (ref 150–400)
RBC: 5.33 MIL/uL — ABNORMAL HIGH (ref 3.87–5.11)
RDW: 15.5 % (ref 11.5–15.5)
WBC: 7.1 10*3/uL (ref 4.0–10.5)

## 2014-09-30 LAB — URINALYSIS, ROUTINE W REFLEX MICROSCOPIC
Bilirubin Urine: NEGATIVE
GLUCOSE, UA: NEGATIVE mg/dL
HGB URINE DIPSTICK: NEGATIVE
Ketones, ur: NEGATIVE mg/dL
NITRITE: NEGATIVE
PH: 5 (ref 5.0–8.0)
PROTEIN: NEGATIVE mg/dL
Specific Gravity, Urine: 1.021 (ref 1.005–1.030)
Urobilinogen, UA: 0.2 mg/dL (ref 0.0–1.0)

## 2014-09-30 LAB — URINE MICROSCOPIC-ADD ON

## 2014-09-30 LAB — PREGNANCY, URINE: PREG TEST UR: NEGATIVE

## 2014-09-30 LAB — LIPASE, BLOOD: Lipase: 16 U/L — ABNORMAL LOW (ref 22–51)

## 2014-09-30 MED ORDER — DICYCLOMINE HCL 10 MG PO CAPS
10.0000 mg | ORAL_CAPSULE | Freq: Once | ORAL | Status: AC
  Administered 2014-09-30: 10 mg via ORAL
  Filled 2014-09-30: qty 1

## 2014-09-30 MED ORDER — DICYCLOMINE HCL 20 MG PO TABS: 20.0000 mg | ORAL_TABLET | Freq: Two times a day (BID) | ORAL | Status: DC

## 2014-09-30 MED ORDER — ONDANSETRON 4 MG PO TBDP: 4.0000 mg | ORAL_TABLET | Freq: Three times a day (TID) | ORAL | Status: DC | PRN

## 2014-09-30 MED ORDER — SODIUM CHLORIDE 0.9 % IV BOLUS (SEPSIS)
1000.0000 mL | Freq: Once | INTRAVENOUS | Status: AC
  Administered 2014-09-30: 1000 mL via INTRAVENOUS

## 2014-09-30 MED ORDER — ACIDOPHILUS PROBIOTIC 10 MG PO TABS: 10.0000 mg | ORAL_TABLET | Freq: Three times a day (TID) | ORAL | Status: DC

## 2014-09-30 MED ORDER — ONDANSETRON HCL 4 MG/2ML IJ SOLN
4.0000 mg | Freq: Once | INTRAMUSCULAR | Status: AC
  Administered 2014-09-30: 4 mg via INTRAVENOUS
  Filled 2014-09-30: qty 2

## 2014-09-30 NOTE — ED Notes (Signed)
Patient states she had abdominal pain since 8-1 associated with N/V and diarrhea but currently experience pain as her only symptom.  Pain is sharp pain that lasts for about a minute but it is ongoing.

## 2014-09-30 NOTE — Discharge Instructions (Signed)

## 2014-09-30 NOTE — ED Provider Notes (Signed)
CSN: [id number]     Arrival date & time 09/30/14  1250 History   First MD Initiated Contact with Patient 09/30/14 1457     Chief Complaint  Patient presents with  . Abdominal Pain   Brittney Sandoval is a 34 y.o. female with a history of hypertension who presents to the ED complaining of abdominal pain, nausea, vomiting and diarrhea since yesterday. Patient reports yesterday she started having nausea, vomiting and diarrhea with epigastric abdominal pain. She reports that today her vomiting and diarrhea has resolved, but she is still having nausea and epigastric abdominal pain. She rates her epigastric pain at an 8/10 and sharp and reports it feels better than it was yesterday. She denies taking anything for treatment today. She denies any vomiting or diarrhea today. The patient reports that her 51-month-old son is sick at home with C. Difficile. She denies other sick contacts. She reports her symptoms are different from her son. She denies recent abx use. The patient reports that her previous abdominal surgeries include a cholecystectomy. The patient denies fevers, chills, lightheadedness, dizziness, urinary symptoms, hematemesis, hematochezia, hematuria, vaginal discharge, vaginal bleeding, recent antibiotic use, rashes, chest pain, shortness of breath, cough.   (Consider location/radiation/quality/duration/timing/severity/associated sxs/prior Treatment) HPI  Past Medical History  Diagnosis Date  . Hypertension    Past Surgical History  Procedure Laterality Date  . Laparoscopic cholecystectomy  2010    [place], [place]  . Tonsillectomy    . Cesarean section N/A 04/04/2013    Procedure: CESAREAN SECTION;  Surgeon: Emily Filbert, MD;  Location: [facility];  Service: Obstetrics;  Laterality: N/A;   Family History  Problem Relation Age of Onset  . Diabetes Mother   . Hypertension Mother   . Kidney disease Mother   . Heart disease Mother    History  Substance Use Topics  . Smoking  status: Never Smoker   . Smokeless tobacco: Never Used  . Alcohol Use: No   OB History    Gravida Para Term Preterm AB TAB SAB Ectopic Multiple Living   4 4 4       4      Review of Systems  Constitutional: Negative for fever and chills.  HENT: Negative for congestion and sore throat.   Eyes: Negative for visual disturbance.  Respiratory: Negative for cough and shortness of breath.   Cardiovascular: Negative for chest pain.  Gastrointestinal: Positive for nausea, vomiting, abdominal pain and diarrhea. Negative for blood in stool.  Genitourinary: Negative for dysuria, urgency, frequency, hematuria, flank pain, vaginal bleeding, vaginal discharge and difficulty urinating.  Musculoskeletal: Negative for back pain and neck pain.  Skin: Negative for rash.  Neurological: Negative for headaches.      Allergies  Review of patient's allergies indicates no known allergies.  Home Medications   Prior to Admission medications   Medication Sig Start Date End Date Taking? Authorizing Provider  cetirizine (ZYRTEC) 10 MG tablet Take 10 mg by mouth daily as needed for allergies.   Yes Historical Provider, MD  desonide (DESONATE) 0.05 % gel Apply 1 application topically 2 (two) times daily as needed (ezcema).   Yes Historical Provider, MD  diltiazem (CARDIZEM CD) 120 MG 24 hr capsule Take 120 mg by mouth daily. 09/14/14  Yes Historical Provider, MD  fluconazole (DIFLUCAN) 150 MG tablet TAKE 1 TABLET FOR 1 DAY 09/21/14  Yes Historical Provider, MD  hydrochlorothiazide (HYDRODIURIL) 25 MG tablet TAKE 1 TABLET BY MOUTH DAILY 01/22/14  Yes Osborne Oman, MD  dicyclomine (  BENTYL) 20 MG tablet Take 1 tablet (20 mg total) by mouth 2 (two) times daily. 09/30/14   Waynetta Pean, PA-C  Fe Fum-FePoly-FA-Vit C-Vit B3 (INTEGRA F) 125-1 MG CAPS Take 1 tablet by mouth daily. Patient not taking: Reported on 04/01/2014 04/07/13   Gwen Pounds, CNM  ibuprofen (ADVIL,MOTRIN) 600 MG tablet Take 1 tablet (600 mg total)  by mouth every 6 (six) hours. Patient not taking: Reported on 04/01/2014 04/07/13   Gwen Pounds, CNM  Lactobacillus (ACIDOPHILUS PROBIOTIC) 10 MG TABS Take 10 mg by mouth 3 (three) times daily. 09/30/14   Waynetta Pean, PA-C  ondansetron (ZOFRAN ODT) 4 MG disintegrating tablet Take 1 tablet (4 mg total) by mouth every 8 (eight) hours as needed for nausea or vomiting. 09/30/14   Waynetta Pean, PA-C  oxyCODONE-acetaminophen (PERCOCET/ROXICET) 5-325 MG per tablet Take 1-2 tablets by mouth every 4 (four) hours as needed for severe pain (moderate - severe pain). Patient not taking: Reported on 04/01/2014 04/07/13   Gwen Pounds, CNM  polyethylene glycol powder (MIRALAX) powder Take 17 g by mouth once. Patient not taking: Reported on 04/01/2014 04/15/13   Allen Norris, MD   BP 137/83 mmHg  Pulse 63  Temp(Src) 98.3 F (36.8 C) (Oral)  Resp 18  Ht 5\' 4"  (1.626 m)  Wt 198 lb (89.812 kg)  BMI 33.97 kg/m2  SpO2 100%  LMP 09/19/2014 Physical Exam  Constitutional: She is oriented to person, place, and time. She appears well-developed and well-nourished. No distress.  Nontoxic appearing.  HENT:  Head: Normocephalic and atraumatic.  Mouth/Throat: Oropharynx is clear and moist. No oropharyngeal exudate.  Eyes: Conjunctivae are normal. Pupils are equal, round, and reactive to light. Right eye exhibits no discharge. Left eye exhibits no discharge.  Neck: Neck supple.  Cardiovascular: Normal rate, regular rhythm, normal heart sounds and intact distal pulses.  Exam reveals no gallop and no friction rub.   No murmur heard. Pulmonary/Chest: Effort normal and breath sounds normal. No respiratory distress. She has no wheezes. She has no rales.  Abdominal: Soft. Bowel sounds are normal. She exhibits no distension and no mass. There is tenderness. There is no rebound and no guarding.  Abdomen is soft. Bowel sounds are present. Patient has mild epigastric tenderness palpation. No McBurney's point tenderness. No  rebound tenderness. Negative psoas and obturator sign. No CVA or flank tenderness.  Musculoskeletal: She exhibits no edema.  Lymphadenopathy:    She has no cervical adenopathy.  Neurological: She is alert and oriented to person, place, and time. Coordination normal.  Skin: Skin is warm and dry. No rash noted. She is not diaphoretic. No erythema. No pallor.  Psychiatric: She has a normal mood and affect. Her behavior is normal.  Nursing note and vitals reviewed.   ED Course  Procedures (including critical care time) Labs Review Labs Reviewed  LIPASE, BLOOD - Abnormal; Notable for the following:    Lipase 16 (*)    All other components within normal limits  COMPREHENSIVE METABOLIC PANEL - Abnormal; Notable for the following:    Total Protein 8.5 (*)    All other components within normal limits  CBC - Abnormal; Notable for the following:    RBC 5.33 (*)    Hemoglobin 11.1 (*)    HCT 35.0 (*)    MCV 65.7 (*)    MCH 20.8 (*)    All other components within normal limits  URINALYSIS, ROUTINE W REFLEX MICROSCOPIC (NOT AT [facility]) - Abnormal; Notable for the following:  APPearance CLOUDY (*)    Leukocytes, UA TRACE (*)    All other components within normal limits  URINE MICROSCOPIC-ADD ON - Abnormal; Notable for the following:    Squamous Epithelial / LPF FEW (*)    Bacteria, UA FEW (*)    All other components within normal limits  PREGNANCY, URINE  I-STAT BETA HCG BLOOD, ED ([facility], [facility], [facility] ONLY)    Imaging Review No results found.   EKG Interpretation None      Filed Vitals:   09/30/14 1300 09/30/14 1713  BP: 147/87 137/83  Pulse: 76 63  Temp: 97.7 F (36.5 C) 98.3 F (36.8 C)  TempSrc: Oral Oral  Resp: 16 18  Height: 5\' 4"  (1.626 m)   Weight: 198 lb (89.812 kg)   SpO2: 100% 100%     MDM   Meds given in ED:  Medications  sodium chloride 0.9 % bolus 1,000 mL (1,000 mLs Intravenous New Bag/Given 09/30/14 1547)  ondansetron (ZOFRAN) injection 4 mg (4 mg Intravenous  Given 09/30/14 1547)  dicyclomine (BENTYL) capsule 10 mg (10 mg Oral Given 09/30/14 1650)    New Prescriptions   DICYCLOMINE (BENTYL) 20 MG TABLET    Take 1 tablet (20 mg total) by mouth 2 (two) times daily.   LACTOBACILLUS (ACIDOPHILUS PROBIOTIC) 10 MG TABS    Take 10 mg by mouth 3 (three) times daily.   ONDANSETRON (ZOFRAN ODT) 4 MG DISINTEGRATING TABLET    Take 1 tablet (4 mg total) by mouth every 8 (eight) hours as needed for nausea or vomiting.    Final diagnoses:  Nausea vomiting and diarrhea  Epigastric abdominal pain   This is a 34 y.o. female with a history of hypertension who presents to the ED complaining of abdominal pain, nausea, vomiting and diarrhea since yesterday. Patient reports yesterday she started having nausea, vomiting and diarrhea with epigastric abdominal pain. She reports that today her vomiting and diarrhea has resolved, but she is still having nausea and epigastric abdominal pain. She rates her epigastric pain at an 8/10 and sharp and reports it feels better than it was yesterday.  On exam the patient is afebrile nontoxic appearing. Patient's abdomen is soft and there is mild epigastric tenderness palpation. No peritoneal signs. She denies urinary symptoms. Urine shows trace leukocytes. Her pregnancy test is negative. Lipase is 16. CMP is unremarkable. Liver enzymes are within normal limits. CBC shows no leukocytosis. Her hemoglobin is 11.1 which is an improvement from her most recent blood work. Patient reports she has not been taking her iron supplementation recently. I encouraged her to continue taking this. She reports having a refill at her pharmacy. Patient received fluid bolus, Zofran and Bentyl in the emergency department. She reports her pain is down to 2 out of 10 and she feels much better. She ate graham crackers and drink water in the emergency department without nausea or vomiting. She reports feeling ready for discharge. We'll discharge patient perceptions for  Bentyl, Zofran and a probiotic. Strict return precautions provided. I advised the patient to follow-up with their primary care provider this week. I advised the patient to return to the emergency department with new or worsening symptoms or new concerns. The patient verbalized understanding and agreement with plan.     Waynetta Pean, PA-C 09/30/14 1809  Gareth Morgan, MD 10/01/14 1329

## 2014-11-25 ENCOUNTER — Encounter (HOSPITAL_COMMUNITY): Payer: Self-pay | Admitting: *Deleted

## 2014-11-25 ENCOUNTER — Emergency Department (HOSPITAL_COMMUNITY)
Admission: EM | Admit: 2014-11-25 | Discharge: 2014-11-25 | Discharge status: Against Medical Advice | Payer: Medicaid Other | Attending: Emergency Medicine | Admitting: Emergency Medicine

## 2014-11-25 DIAGNOSIS — I10 Essential (primary) hypertension: Secondary | ICD-10-CM | POA: Insufficient documentation

## 2014-11-25 LAB — URINALYSIS, ROUTINE W REFLEX MICROSCOPIC
BILIRUBIN URINE: NEGATIVE
Glucose, UA: NEGATIVE mg/dL
KETONES UR: 15 mg/dL — AB
Nitrite: NEGATIVE
PH: 5 (ref 5.0–8.0)
PROTEIN: NEGATIVE mg/dL
Specific Gravity, Urine: 1.019 (ref 1.005–1.030)
UROBILINOGEN UA: 1 mg/dL (ref 0.0–1.0)

## 2014-11-25 LAB — COMPREHENSIVE METABOLIC PANEL
ALBUMIN: 4 g/dL (ref 3.5–5.0)
ALK PHOS: 58 U/L (ref 38–126)
ALT: 20 U/L (ref 14–54)
AST: 21 U/L (ref 15–41)
Anion gap: 3 — ABNORMAL LOW (ref 5–15)
BILIRUBIN TOTAL: 0.2 mg/dL — AB (ref 0.3–1.2)
BUN: 16 mg/dL (ref 6–20)
CALCIUM: 9.5 mg/dL (ref 8.9–10.3)
CO2: 28 mmol/L (ref 22–32)
Chloride: 103 mmol/L (ref 101–111)
Creatinine, Ser: 0.78 mg/dL (ref 0.44–1.00)
Glucose, Bld: 100 mg/dL — ABNORMAL HIGH (ref 65–99)
POTASSIUM: 3.7 mmol/L (ref 3.5–5.1)
Sodium: 134 mmol/L — ABNORMAL LOW (ref 135–145)
Total Protein: 7.9 g/dL (ref 6.5–8.1)

## 2014-11-25 LAB — CBC WITH DIFFERENTIAL/PLATELET
BASOS PCT: 1 %
Basophils Absolute: 0.1 10*3/uL (ref 0.0–0.1)
Eosinophils Absolute: 0.2 10*3/uL (ref 0.0–0.7)
Eosinophils Relative: 4 %
HEMATOCRIT: 34.2 % — AB (ref 36.0–46.0)
HEMOGLOBIN: 10.9 g/dL — AB (ref 12.0–15.0)
LYMPHS ABS: 1.8 10*3/uL (ref 0.7–4.0)
LYMPHS PCT: 34 %
MCH: 21 pg — AB (ref 26.0–34.0)
MCHC: 31.9 g/dL (ref 30.0–36.0)
MCV: 66 fL — ABNORMAL LOW (ref 78.0–100.0)
MONOS PCT: 6 %
Monocytes Absolute: 0.3 10*3/uL (ref 0.1–1.0)
Neutro Abs: 2.8 10*3/uL (ref 1.7–7.7)
Neutrophils Relative %: 55 %
Platelets: 334 10*3/uL (ref 150–400)
RBC: 5.18 MIL/uL — ABNORMAL HIGH (ref 3.87–5.11)
RDW: 15.2 % (ref 11.5–15.5)
WBC: 5.2 10*3/uL (ref 4.0–10.5)

## 2014-11-25 LAB — POC URINE PREG, ED: PREG TEST UR: NEGATIVE

## 2014-11-25 LAB — URINE MICROSCOPIC-ADD ON

## 2014-11-25 NOTE — ED Notes (Signed)
see triage note. pt reports hx of HTN, takes medications, reports her BP has been high the last few days. reports she just  got off her menstrual cycle and was "passing clots, and wants to know if she could have clots in her body". reports abd pain 3/10. Reports nausea this morning. Denies vomiting. Denies blood in stool.   Pt reports headaches for the last few days as well. Along with elevated BP.

## 2014-11-25 NOTE — ED Notes (Signed)
Before the collect blood patient stated that she will have to leave to pick her children up.  I made her aware when she came back in that she would have to start all over again.  She stated that she understood that.

## 2014-11-25 NOTE — ED Notes (Signed)
Pt reports she has to leave to pick up her children, but then will come back. Pt asking if she will have to have blood work drawn again, rn explained she is unsure, but that the check in process would start all over.

## 2014-11-25 NOTE — ED Notes (Signed)
Pt states "I work @ [employer] and I've been checking my b/p consistently.  The diastolic has run between 335/45.  The highest it has been has been 143/100.  I've been diltiazem since the 1st of the year.  I take HCTZ."

## 2015-04-03 IMAGING — US US OB FOLLOW-UP
1 series · 12 of 28 positions shown · non-contrast
Comparison: none

[Series 1: us ob follow up · 12 of 51 slices shown]
[im 2/51]
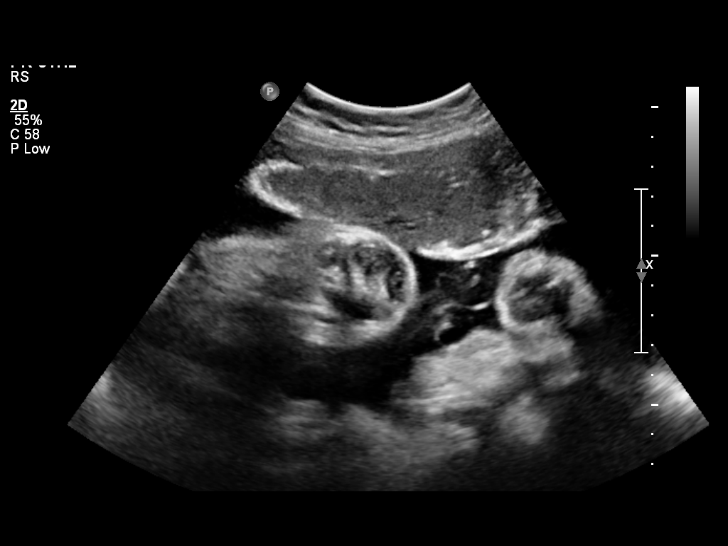
[im 6/51]
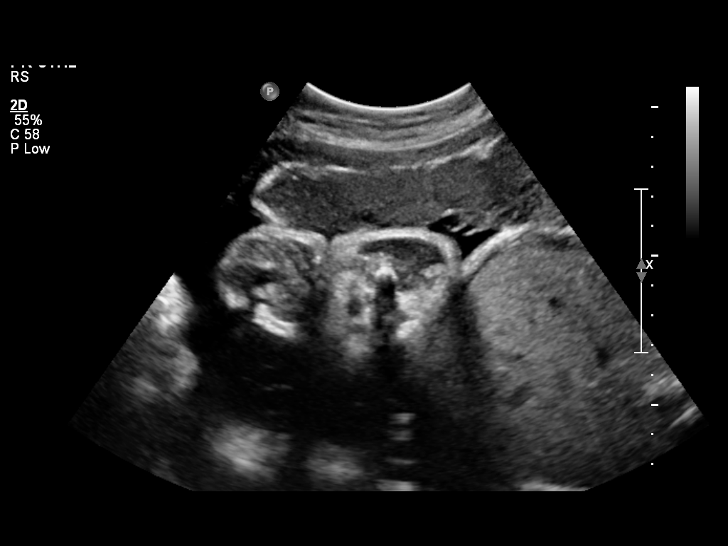
[im 10/51]
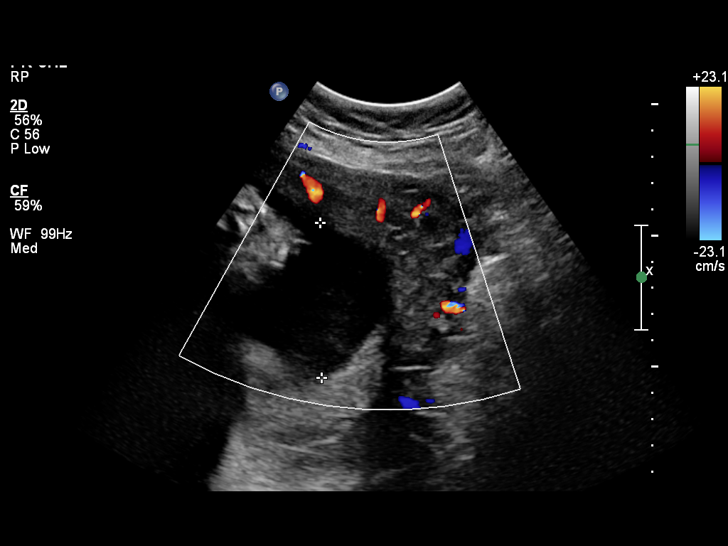
[im 15/51]
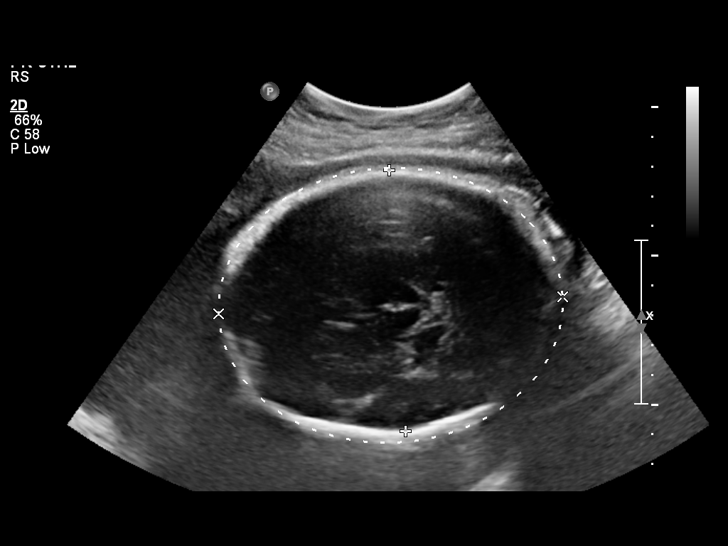
[im 19/51]
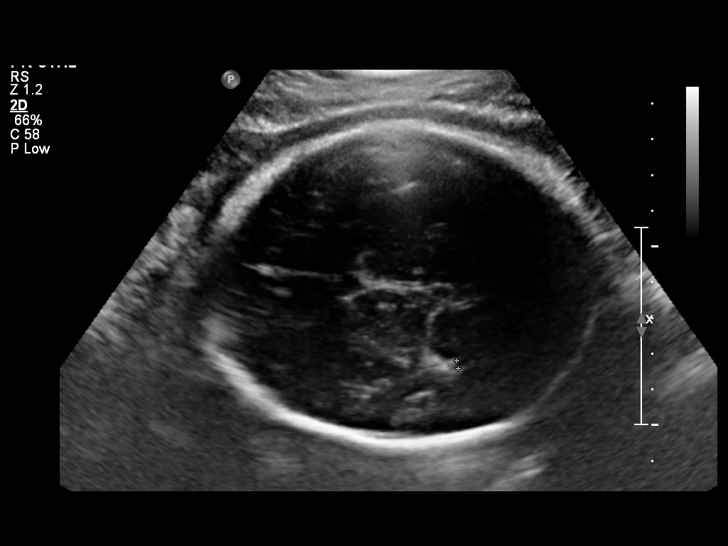
[im 23/51]
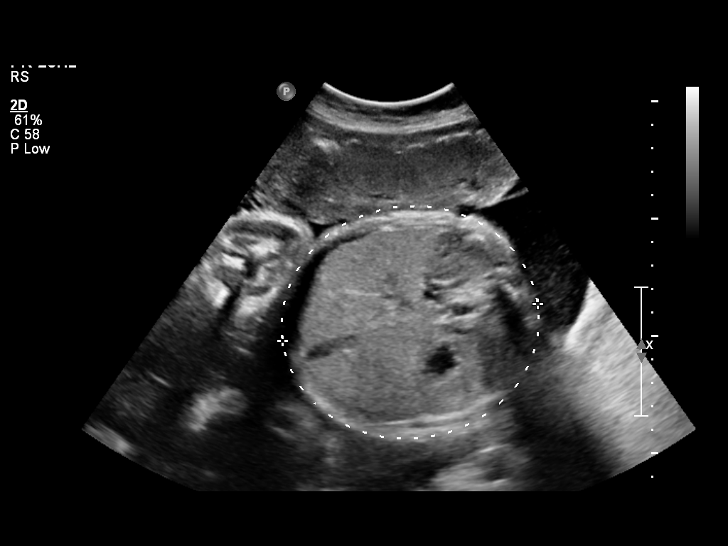
[im 28/51]
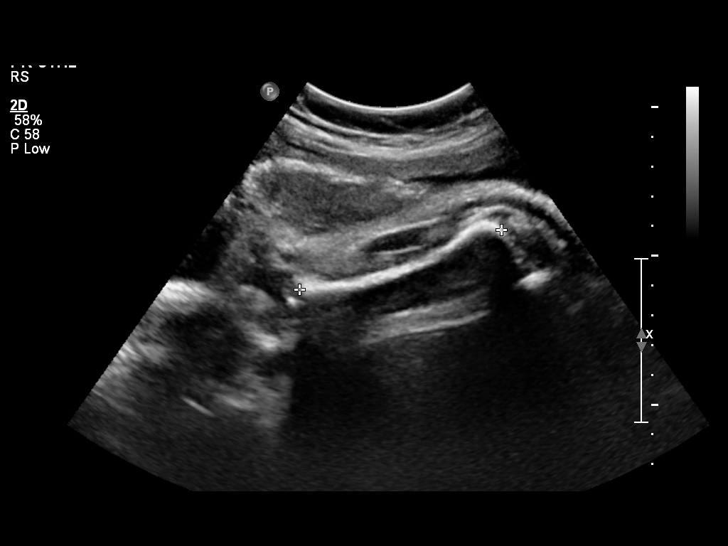
[im 32/51]
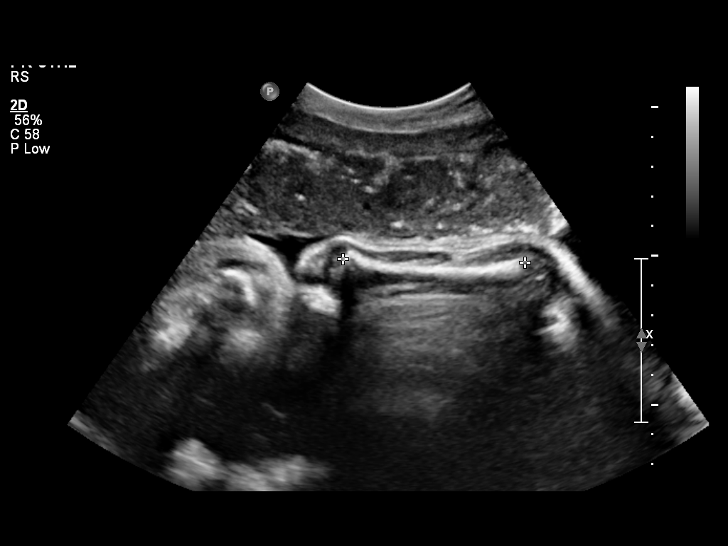
[im 36/51]
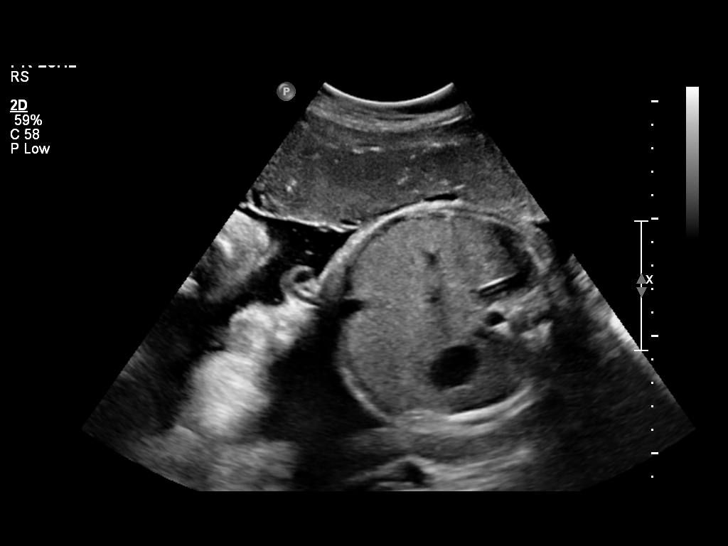
[im 41/51]
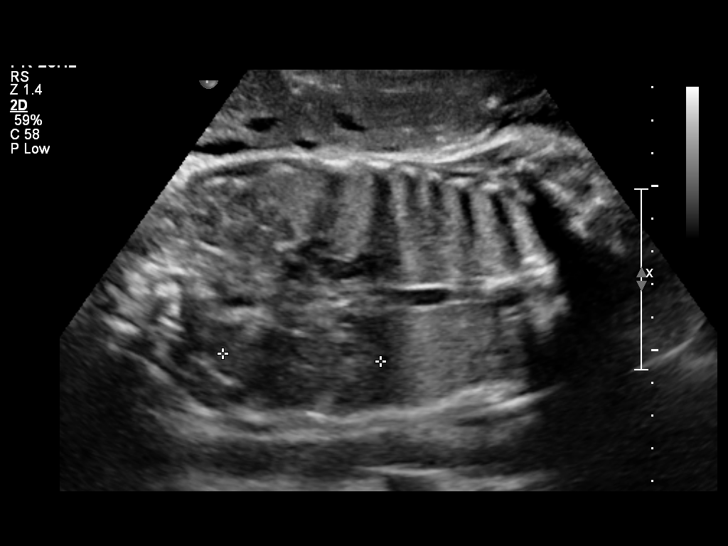
[im 45/51]
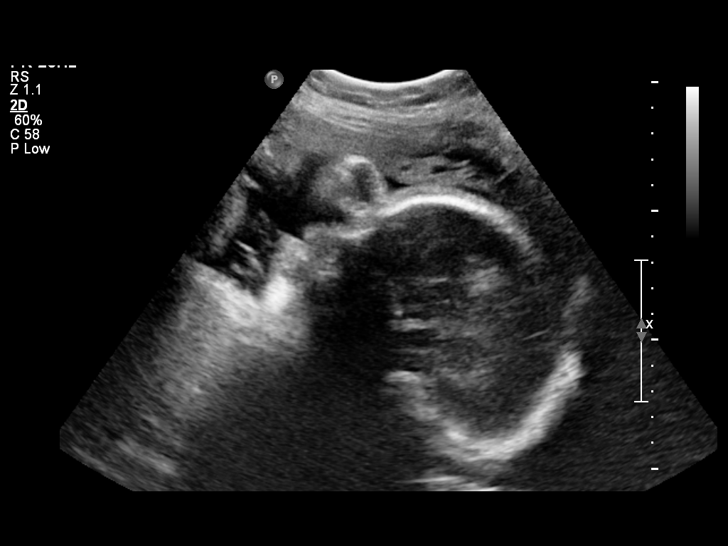
[im 49/51]
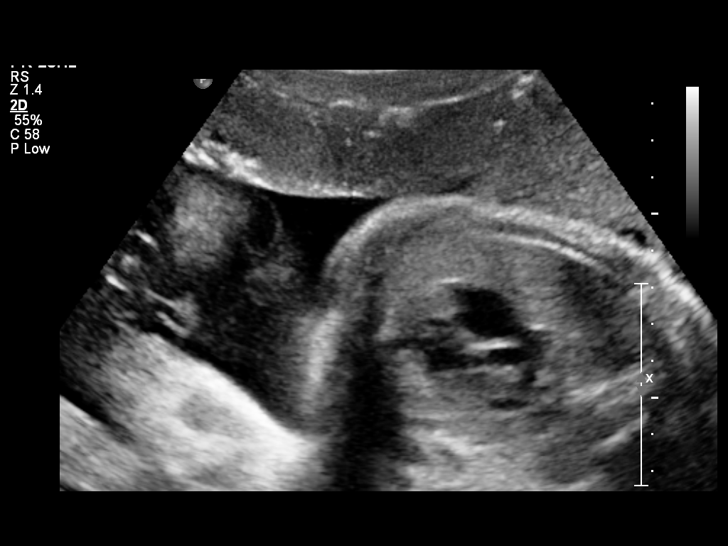

[12 of 28 positions shown; findings below may reference images not displayed]

OBSTETRICS REPORT
                      (Signed Final 03/28/2013 [DATE])

 Name:       LILIA PELUCHE              Visit Date: 03/28/2013 [DATE]

Service(s) Provided

 US OB FOLLOW UP                                       76816.1
Indications

 Poor obstetric history: Previous preeclampsia
 Hypertension - Chronic/Pre-existing (no meds)
Fetal Evaluation

 Num Of Fetuses:    1
 Fetal Heart Rate:  140                          bpm
 Cardiac Activity:  Observed
 Presentation:      Cephalic
 Placenta:          Anterior, above cervical os
 P. Cord            Previously Visualized
 Insertion:

 Amniotic Fluid
 AFI FV:      Subjectively within normal limits
 AFI Sum:     23.73   cm       94  %Tile     Larg Pckt:    7.16  cm
 RUQ:   7.16    cm   RLQ:    5.88   cm    LUQ:   6.32    cm   LLQ:    4.37   cm
Biometry

 BPD:       87  mm     G. Age:  35w 1d                CI:         71.7   70 - 86
                                                      FL/HC:      21.6   20.9 -

 HC:     327.1  mm     G. Age:  37w 1d       16  %    HC/AC:      0.99   0.92 -

 AC:     329.7  mm     G. Age:  36w 6d       39  %    FL/BPD:     81.0   71 - 87
 FL:      70.5  mm     G. Age:  36w 1d       14  %    FL/AC:      21.4   20 - 24
 HUM:       61  mm     G. Age:  35w 2d       29  %

 Est. FW:    0699  gm      6 lb 8 oz     45  %
Gestational Age

 LMP:           38w 4d        Date:  07/01/12                 EDD:   04/07/13
 U/S Today:     36w 2d                                        EDD:   04/23/13
 Best:          37w 6d     Det. By:  U/S C R L   (09/25/12)   EDD:   04/12/13
Anatomy

 Cranium:          Appears normal         Aortic Arch:      Previously seen
 Fetal Cavum:      Previously seen        Ductal Arch:      Previously seen
 Ventricles:       Appears normal         Diaphragm:        Appears normal
 Choroid Plexus:   Previously seen        Stomach:          Appears normal, left
                                                            sided
 Cerebellum:       Previously seen        Abdomen:          Appears normal
 Posterior Fossa:  Previously seen        Abdominal Wall:   Previously seen
 Nuchal Fold:      Previously seen        Cord Vessels:     Previously seen
 Face:             Orbits and profile     Kidneys:          Appear normal
                   previously seen
 Lips:             Previously seen        Bladder:          Appears normal
 Heart:            Appears normal         Spine:            Previously seen
                   (4CH, axis, and
                   situs)
 RVOT:             Appears normal         Lower             Previously seen
                                          Extremities:
 LVOT:             Appears normal         Upper             Previously seen
                                          Extremities:

 Other:  Male gender. Heels and 5th digit previously seen.
Cervix Uterus Adnexa

 Cervix:       Not visualized (advanced GA >34 wks)
Impression

 IUP at 37+6 weeks
 Normal interval anatomy; anatomic survey complete
 Normal amniotic fluid volume
 Appropriate interval growth with EFW at the 45th %tile
Recommendations

 Continue twice weekly NSTs with weekly AFIs

## 2015-05-01 LAB — OB RESULTS CONSOLE GC/CHLAMYDIA: Chlamydia: NEGATIVE

## 2015-05-05 LAB — OB RESULTS CONSOLE GC/CHLAMYDIA: Gonorrhea: NEGATIVE

## 2015-06-29 ENCOUNTER — Other Ambulatory Visit (HOSPITAL_COMMUNITY): Payer: Self-pay | Admitting: Obstetrics and Gynecology

## 2015-06-29 DIAGNOSIS — O280 Abnormal hematological finding on antenatal screening of mother: Secondary | ICD-10-CM

## 2015-06-29 DIAGNOSIS — O09522 Supervision of elderly multigravida, second trimester: Secondary | ICD-10-CM

## 2015-06-29 DIAGNOSIS — L0591 Pilonidal cyst without abscess: Secondary | ICD-10-CM

## 2015-07-03 ENCOUNTER — Encounter (HOSPITAL_COMMUNITY): Payer: Self-pay | Admitting: Obstetrics and Gynecology

## 2015-07-17 ENCOUNTER — Ambulatory Visit (HOSPITAL_COMMUNITY)
Admission: RE | Admit: 2015-07-17 | Discharge: 2015-07-17 | Disposition: A | Discharge status: Home / Self Care | Payer: Medicaid Other | Source: Ambulatory Visit | Attending: Obstetrics and Gynecology | Admitting: Obstetrics and Gynecology

## 2015-07-17 ENCOUNTER — Encounter (HOSPITAL_COMMUNITY): Payer: Self-pay

## 2015-07-17 ENCOUNTER — Other Ambulatory Visit (HOSPITAL_COMMUNITY): Payer: Self-pay | Admitting: Obstetrics and Gynecology

## 2015-07-17 DIAGNOSIS — O10912 Unspecified pre-existing hypertension complicating pregnancy, second trimester: Secondary | ICD-10-CM

## 2015-07-17 DIAGNOSIS — O34219 Maternal care for unspecified type scar from previous cesarean delivery: Secondary | ICD-10-CM | POA: Insufficient documentation

## 2015-07-17 DIAGNOSIS — Z1389 Encounter for screening for other disorder: Secondary | ICD-10-CM

## 2015-07-17 DIAGNOSIS — O09522 Supervision of elderly multigravida, second trimester: Secondary | ICD-10-CM

## 2015-07-17 DIAGNOSIS — L0591 Pilonidal cyst without abscess: Secondary | ICD-10-CM

## 2015-07-17 DIAGNOSIS — O10012 Pre-existing essential hypertension complicating pregnancy, second trimester: Secondary | ICD-10-CM | POA: Diagnosis not present

## 2015-07-17 DIAGNOSIS — Z3A20 20 weeks gestation of pregnancy: Secondary | ICD-10-CM | POA: Insufficient documentation

## 2015-07-17 DIAGNOSIS — O280 Abnormal hematological finding on antenatal screening of mother: Secondary | ICD-10-CM

## 2015-07-17 DIAGNOSIS — Z862 Personal history of diseases of the blood and blood-forming organs and certain disorders involving the immune mechanism: Secondary | ICD-10-CM | POA: Diagnosis not present

## 2015-07-17 DIAGNOSIS — Z36 Encounter for antenatal screening of mother: Secondary | ICD-10-CM | POA: Diagnosis present

## 2015-07-17 DIAGNOSIS — O352XX0 Maternal care for (suspected) hereditary disease in fetus, not applicable or unspecified: Secondary | ICD-10-CM | POA: Insufficient documentation

## 2015-07-17 DIAGNOSIS — O289 Unspecified abnormal findings on antenatal screening of mother: Secondary | ICD-10-CM | POA: Diagnosis present

## 2015-07-17 DIAGNOSIS — O09292 Supervision of pregnancy with other poor reproductive or obstetric history, second trimester: Secondary | ICD-10-CM

## 2015-07-17 DIAGNOSIS — O09529 Supervision of elderly multigravida, unspecified trimester: Secondary | ICD-10-CM | POA: Insufficient documentation

## 2015-07-17 DIAGNOSIS — Z98891 History of uterine scar from previous surgery: Secondary | ICD-10-CM

## 2015-07-17 MED ORDER — ASPIRIN EC 81 MG PO TBEC
81.0000 mg | DELAYED_RELEASE_TABLET | Freq: Every day | ORAL | Status: DC
Start: 2015-07-17 — End: 2015-11-29

## 2015-07-17 NOTE — Progress Notes (Signed)
Genetic Counseling  Visit Summary Note  Appointment Date: 07/17/2015 Referred By: Larina Earthly  Date of Birth: 1980-05-20  Pregnancy history: [id number] Estimated Date of Delivery: 12/01/15 Estimated Gestational Age: [redacted]w[redacted]d  Brittney Sandoval was seen for genetic counseling because of a maternal age of 35 y.o..     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   We reviewed chromosomes, nondisjunction, and the associated 1 in 111 risk for fetal aneuploidy related to a maternal age of 35 y.o. at [redacted]w[redacted]d gestation.  She was counseled that the risk for aneuploidy decreases as gestational age increases, accounting for those pregnancies which spontaneously abort.  We specifically discussed Down syndrome (trisomy 51), trisomies 46 and 27, and sex chromosome aneuploidies (47,XXX and 47,XXY) including the common features and prognoses of each.   We also reviewed the results of Brittney Sandoval's screening tests.  She initially had a first trimester screen which suggested that there was an increased risk for Down syndrome in the pregnancy.  She then had non-invasive prenatal screening (NIPS) twice, after the first resulted in a low fetal fraction. We discussed that NIPS analyzes placental cell free DNA in maternal circulation to evaluate for the presence of extra chromosome conditions.  Thus, it is able to provide risk assessment for specific chromosome conditions, but is not considered to be diagnostic.  Specifically, Brittney Sandoval had  [facility] prenatal screen. The second time it was performed, a low risk result was obtained.  Based on this test, the chance for her baby to have aneuploidy for chromosomes 13, 18, 21 or X was reduced to less than 1 in 10000.  She was counseled that 50-80% of fetuses with Down syndrome and up to 90% of fetuses with trisomies 34 and 18, when well visualized, have detectable anomalies or soft markers by ultrasound.  A detailed fetal anatomy ultrasound was performed today.  That full report will be sent under separate cover.  We discussed that her ultrasound today did not identify any fetal anomalies or soft markers of aneuploidy.  We also discussed the availability of diagnostic testing by way of amniocentesis.  We reviewed the risks, benefits and limitations of amniocentesis including the approximate 1 in 300-500 risk for pregnancy complications following amniocentesis.  After reviewing the above results and the available options, Brittney Sandoval expressed that she is not interested in pursuing diagnostic testing at this time or in the future, given the associated risk of complications.  She understands that ultrasound and NIPS cannot rule out all birth defects or genetic syndromes.    Both family histories were reviewed. 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  We discussed that Sickle Cell anemia (SCA) is a hemoglobinopathy in which there is an inherited structural abnormality in one of the globin chains, specifically the beta globin chain.  It is characterized by episodes of vaso-occlusive crisis and chronic anemia due to the tendency of red blood cells to become deformed under conditions of decreased oxygen tension.  The basic defect in SCA involves the change of a single  amino acid altering the configuration of the hemoglobin molecule causing the cells to sickle and to obstruct blood flow in small vessels.  Ischemia of tissues and organs results.  Increased cell fragility with  increased phagocytosis and splenic sequestration of the fragile cells produces anemia.  SCA (Hgb SS) is inherited as an autosomal recessive condition.  The carrier state is Hgb AS.  Brittney Sandoval was counseled about the 1 in 4 (25%) chance with each pregnancy to have a child with SCA if she and the father of the baby were both carriers for SCA (Hgb AS).   Brittney Sandoval stated that the father of the baby was unaware of being a carrier.  We discussed that other hemoglobin variants, when combined with hemoglobin S, can lead to a clinically significant hemoglobinopathy.  If he has not had a hemoglobin electrophoresis to evaluate for different hemoglobin variants (C,D,E, beta thal, etc.) he may wish to consider this to ensure there is not increased risk for their offspring.  Brittney Sandoval was provided with my card and the name of the screening he should consider.  She will contact me directly should she have additional questions or concerns about his screening.   We reviewed that early diagnosis of SCA is facilitated by newborn screening before the onset of symptoms.  Clinical manifestations usually present in the first or second year of life.   The remainder of the family histories were reviewed and found to be noncontributory for birth defects, mental retardation, and known genetic conditions. Without further information regarding the provided family history, an accurate genetic risk cannot be calculated. Further genetic counseling is warranted if more information is obtained.  Brittney Sandoval denied exposure to environmental toxins or chemical agents. She denied the use of alcohol, tobacco or street drugs. She denied significant viral illnesses during the course of her pregnancy. Her medical and surgical histories were reviewed with the maternal fetal medicine physician today, therefore we did not discuss them during our visit.   I counseled Brittney Sandoval regarding the above risks and available options.  The  approximate face-to-face time with the genetic counselor was 45 minutes.  Cam Hai, MS,  Certified Genetic Counselor

## 2015-07-17 NOTE — Progress Notes (Signed)
MFM Consultation:  Chronic hypertension (CHTN):  During our discussion, I reviewed hypertension as a cause of uteroplacental insufficiency, with increased risk of IUGR, oligohydramnios, and stillbirth. I told her that her hypertension also places her at increased risk for preeclampsia, describing the triad of increased blood pressure, proteinuria, and abnormal edema. Lastly, hypertension (severe range) increases the risk of placental abruption, especially in the setting of superimposed preeclampsia. ? I reviewed the essential tenets in the most recent guidelines for management of hypertension in pregnancy in accordance with the [place] of Obstetrics and Gynecology expert opinion. We talked about the medical treatment of hypertension in pregnancy. I outlined the different classes of medications, emphasizing that angiotensin enzyme inhibitors and angiotensin receptor blockers are contraindicated, and diuretics are relatively contraindicated. I told her that beta-blockers and calcium channel blockers are commonly used to treat hypertension in pregnancy, and that both are felt to be safe for use in pregnancy.  ? I outlined the usual plan of management for hypertension in pregnancy. She should have her blood pressure carefully followed, and her medications adjusted to keep her BP in the target range of around 130-159/70-109 mm/Hg; ie, HTN should not be treated (no medication adjustment) until 160/110 or greater measurements for blood pressures to be consistent with current ACOG/SMFM guidelines (ie, guidelines are 160/110 in absence of renal/cardiac disease during pregnancy).  ? Assuming all goes well I recommend delivery at 38-39 weeks. ? 2. Low dose aspirin for preeclampsia prevention in HTN and hx of preeclampsia in prior pregnancy Given her risk factor of HTN, I recommend low-dose aspirin 81 mg tablet taken daily until 2 week before delivery (until 36-37 weeks). She will start today as it is  important that this preventive therapy be started prior to 20 weeks in this pregnancy.  3. Pilonidal cyst:  I agree that this cyst should be assessed by general surgery and/or interventional radiology to drain prior to delivery.  It may be reasonable to inquire from our group again closer to delivery, but ultimately, I do not feel that it should affect mode of delivery.  4. TOLAC/VBAC:  The patient was counseled on risks/benefits of cesarean vs. TOLAC in setting of 1 prior cesarean due to breech presentation.  I cited a 75% success rate for VBAC and 0.5% risk for uterine rupture. I reviewed the circumstances of her prior delivery. We discussed the ACOG criteria for allowing trial of labor for patients with previous cesarean section. These include: one prior low-transverse cesarean delivery (or two prior low-transverse cesareans with a prior vaginal delivery), a clinically adequate pelvis, and no other uterine scars or previous rupture. Additionally, a physician must be immediately available throughout labor and capable of performing emergency cesarean section with anesthesia and other personnel available for emergency cesarean delivery.  She meets criteria as a good candidate and desires VBAC at this point.  Time Spent: I spent in excess of 45 minutes in consultation with this patient to review records, evaluate her case, and provide her with an adequate discussion and education.  More than 50% of this time was spent in direct face-to-face counseling.  It was a pleasure seeing your patient in the office today.  Thank you for consultation. Please do not hesitate to contact our service for any further questions.   Thank you,  Delman Cheadle Harl Favor, Delman Cheadle, MD, MS, FACOG Assistant Professor Section of [place]

## 2015-07-17 NOTE — Addendum Note (Signed)
Encounter addended by: Oralia Rud, MD on: 07/17/2015  4:28 PM<BR>     Documentation filed: Charges VN

## 2015-07-17 NOTE — Consult Note (Signed)
MFM Consultation:  Chronic hypertension (CHTN):  During our discussion, I reviewed hypertension as a cause of uteroplacental insufficiency, with increased risk of IUGR, oligohydramnios, and stillbirth. I told her that her hypertension also places her at increased risk for preeclampsia, describing the triad of increased blood pressure, proteinuria, and abnormal edema. Lastly, hypertension (severe range) increases the risk of placental abruption, especially in the setting of superimposed preeclampsia. ? I reviewed the essential tenets in the most recent guidelines for management of hypertension in pregnancy in accordance with the [place] of Obstetrics and Gynecology expert opinion. We talked about the medical treatment of hypertension in pregnancy. I outlined the different classes of medications, emphasizing that angiotensin enzyme inhibitors and angiotensin receptor blockers are contraindicated, and diuretics are relatively contraindicated. I told her that beta-blockers and calcium channel blockers are commonly used to treat hypertension in pregnancy, and that both are felt to be safe for use in pregnancy.  ? I outlined the usual plan of management for hypertension in pregnancy. She should have her blood pressure carefully followed, and her medications adjusted to keep her BP in the target range of around 130-159/70-109 mm/Hg; ie, HTN should not be treated (no medication adjustment) until 160/110 or greater measurements for blood pressures to be consistent with current ACOG/SMFM guidelines (ie, guidelines are 160/110 in absence of renal/cardiac disease during pregnancy).  ? Assuming all goes well I recommend delivery at 38-39 weeks. ? 2. Low dose aspirin for preeclampsia prevention in HTN and hx of preeclampsia in prior pregnancy Given her risk factor of HTN, I recommend low-dose aspirin 81 mg tablet taken daily until 2 week before delivery (until 36-37 weeks). She will start today as it is  important that this preventive therapy be started prior to 20 weeks in this pregnancy.  3. Pilonidal cyst:  I agree that this cyst should be assessed by general surgery and/or interventional radiology to drain prior to delivery.  It may be reasonable to inquire from our group again closer to delivery, but ultimately, I do not feel that it should affect mode of delivery.  4. TOLAC/VBAC:  The patient was counseled on risks/benefits of cesarean vs. TOLAC in setting of 1 prior cesarean due to breech presentation.  I cited a 75% success rate for VBAC and 0.5% risk for uterine rupture. I reviewed the circumstances of her prior delivery. We discussed the ACOG criteria for allowing trial of labor for patients with previous cesarean section. These include: one prior low-transverse cesarean delivery (or two prior low-transverse cesareans with a prior vaginal delivery), a clinically adequate pelvis, and no other uterine scars or previous rupture. Additionally, a physician must be immediately available throughout labor and capable of performing emergency cesarean section with anesthesia and other personnel available for emergency cesarean delivery.  She meets criteria as a good candidate and desires VBAC at this point.  Time Spent: I spent in excess of 45 minutes in consultation with this patient to review records, evaluate her case, and provide her with an adequate discussion and education.  More than 50% of this time was spent in direct face-to-face counseling.  It was a pleasure seeing your patient in the office today.  Thank you for consultation. Please do not hesitate to contact our service for any further questions.   Thank you,  Delman Cheadle Harl Favor, Delman Cheadle, MD, MS, FACOG Assistant Professor Section of [place]

## 2015-07-20 ENCOUNTER — Other Ambulatory Visit (HOSPITAL_COMMUNITY): Payer: Self-pay | Admitting: *Deleted

## 2015-07-20 DIAGNOSIS — O169 Unspecified maternal hypertension, unspecified trimester: Secondary | ICD-10-CM

## 2015-07-21 ENCOUNTER — Other Ambulatory Visit (HOSPITAL_COMMUNITY): Payer: Self-pay

## 2015-08-14 ENCOUNTER — Encounter (HOSPITAL_COMMUNITY): Payer: Self-pay

## 2015-08-17 ENCOUNTER — Ambulatory Visit (HOSPITAL_COMMUNITY): Admission: RE | Admit: 2015-08-17 | Payer: Medicaid Other | Source: Ambulatory Visit

## 2015-08-25 ENCOUNTER — Ambulatory Visit (HOSPITAL_COMMUNITY): Admission: RE | Admit: 2015-08-25 | Payer: Medicaid Other | Source: Ambulatory Visit

## 2015-08-25 LAB — OB RESULTS CONSOLE HIV ANTIBODY (ROUTINE TESTING): HIV: NONREACTIVE

## 2015-09-14 ENCOUNTER — Encounter (HOSPITAL_COMMUNITY): Payer: Self-pay

## 2015-09-14 ENCOUNTER — Ambulatory Visit (HOSPITAL_COMMUNITY)
Admission: RE | Admit: 2015-09-14 | Discharge: 2015-09-14 | Disposition: A | Discharge status: Home / Self Care | Payer: Medicaid Other | Source: Ambulatory Visit | Attending: Obstetrics and Gynecology | Admitting: Obstetrics and Gynecology

## 2015-09-14 DIAGNOSIS — O283 Abnormal ultrasonic finding on antenatal screening of mother: Secondary | ICD-10-CM | POA: Diagnosis not present

## 2015-09-14 DIAGNOSIS — O34219 Maternal care for unspecified type scar from previous cesarean delivery: Secondary | ICD-10-CM | POA: Diagnosis present

## 2015-09-14 DIAGNOSIS — O99213 Obesity complicating pregnancy, third trimester: Secondary | ICD-10-CM | POA: Diagnosis not present

## 2015-09-14 DIAGNOSIS — Z3A28 28 weeks gestation of pregnancy: Secondary | ICD-10-CM | POA: Insufficient documentation

## 2015-09-14 DIAGNOSIS — O09523 Supervision of elderly multigravida, third trimester: Secondary | ICD-10-CM | POA: Insufficient documentation

## 2015-09-14 DIAGNOSIS — O169 Unspecified maternal hypertension, unspecified trimester: Secondary | ICD-10-CM

## 2015-09-14 DIAGNOSIS — O10013 Pre-existing essential hypertension complicating pregnancy, third trimester: Secondary | ICD-10-CM | POA: Diagnosis not present

## 2015-10-12 ENCOUNTER — Encounter (HOSPITAL_COMMUNITY): Payer: Self-pay

## 2015-10-12 ENCOUNTER — Ambulatory Visit (HOSPITAL_COMMUNITY)
Admission: RE | Admit: 2015-10-12 | Discharge: 2015-10-12 | Disposition: A | Discharge status: Home / Self Care | Payer: Medicaid Other | Source: Ambulatory Visit | Attending: Obstetrics and Gynecology | Admitting: Obstetrics and Gynecology

## 2015-10-12 ENCOUNTER — Other Ambulatory Visit (HOSPITAL_COMMUNITY): Payer: Self-pay | Admitting: Obstetrics and Gynecology

## 2015-10-12 DIAGNOSIS — Z3A32 32 weeks gestation of pregnancy: Secondary | ICD-10-CM | POA: Diagnosis not present

## 2015-10-12 DIAGNOSIS — O169 Unspecified maternal hypertension, unspecified trimester: Secondary | ICD-10-CM

## 2015-10-12 DIAGNOSIS — O163 Unspecified maternal hypertension, third trimester: Secondary | ICD-10-CM | POA: Diagnosis not present

## 2015-10-12 DIAGNOSIS — O09523 Supervision of elderly multigravida, third trimester: Secondary | ICD-10-CM | POA: Diagnosis present

## 2015-11-09 ENCOUNTER — Other Ambulatory Visit (HOSPITAL_COMMUNITY): Payer: Self-pay | Admitting: Obstetrics and Gynecology

## 2015-11-09 ENCOUNTER — Ambulatory Visit (HOSPITAL_COMMUNITY)
Admission: RE | Admit: 2015-11-09 | Discharge: 2015-11-09 | Disposition: A | Discharge status: Home / Self Care | Payer: Medicaid Other | Source: Ambulatory Visit | Attending: Obstetrics and Gynecology | Admitting: Obstetrics and Gynecology

## 2015-11-09 ENCOUNTER — Encounter (HOSPITAL_COMMUNITY): Payer: Self-pay

## 2015-11-09 DIAGNOSIS — O34219 Maternal care for unspecified type scar from previous cesarean delivery: Secondary | ICD-10-CM | POA: Diagnosis not present

## 2015-11-09 DIAGNOSIS — E669 Obesity, unspecified: Secondary | ICD-10-CM | POA: Diagnosis not present

## 2015-11-09 DIAGNOSIS — Z862 Personal history of diseases of the blood and blood-forming organs and certain disorders involving the immune mechanism: Secondary | ICD-10-CM

## 2015-11-09 DIAGNOSIS — O10013 Pre-existing essential hypertension complicating pregnancy, third trimester: Secondary | ICD-10-CM

## 2015-11-09 DIAGNOSIS — O281 Abnormal biochemical finding on antenatal screening of mother: Secondary | ICD-10-CM

## 2015-11-09 DIAGNOSIS — O169 Unspecified maternal hypertension, unspecified trimester: Secondary | ICD-10-CM

## 2015-11-09 DIAGNOSIS — Z3A36 36 weeks gestation of pregnancy: Secondary | ICD-10-CM

## 2015-11-09 DIAGNOSIS — O99213 Obesity complicating pregnancy, third trimester: Secondary | ICD-10-CM

## 2015-11-25 ENCOUNTER — Encounter (HOSPITAL_COMMUNITY)
Admission: RE | Disposition: A | Discharge status: Home / Self Care | Payer: Self-pay | Source: Ambulatory Visit | Attending: Obstetrics and Gynecology

## 2015-11-25 ENCOUNTER — Inpatient Hospital Stay (HOSPITAL_COMMUNITY)
Admission: AD | Admit: 2015-11-25 | Discharge: 2015-11-25 | Disposition: A | Discharge status: Home / Self Care | Payer: Medicaid Other | Source: Ambulatory Visit | Attending: Obstetrics and Gynecology | Admitting: Obstetrics and Gynecology

## 2015-11-25 ENCOUNTER — Inpatient Hospital Stay (HOSPITAL_COMMUNITY): Payer: Medicaid Other | Admitting: Anesthesiology

## 2015-11-25 ENCOUNTER — Observation Stay (HOSPITAL_COMMUNITY): Payer: Medicaid Other

## 2015-11-25 ENCOUNTER — Inpatient Hospital Stay (HOSPITAL_COMMUNITY)
Admission: RE | Admit: 2015-11-25 | Discharge: 2015-11-29 | DRG: 765 | Disposition: A | Discharge status: Home / Self Care | Payer: Medicaid Other | Source: Ambulatory Visit | Attending: Obstetrics and Gynecology | Admitting: Obstetrics and Gynecology

## 2015-11-25 ENCOUNTER — Encounter (HOSPITAL_COMMUNITY): Payer: Self-pay

## 2015-11-25 ENCOUNTER — Encounter (HOSPITAL_COMMUNITY): Payer: Self-pay | Admitting: *Deleted

## 2015-11-25 DIAGNOSIS — Z3A39 39 weeks gestation of pregnancy: Secondary | ICD-10-CM

## 2015-11-25 DIAGNOSIS — O320XX0 Maternal care for unstable lie, not applicable or unspecified: Secondary | ICD-10-CM | POA: Diagnosis present

## 2015-11-25 DIAGNOSIS — O9081 Anemia of the puerperium: Secondary | ICD-10-CM | POA: Diagnosis not present

## 2015-11-25 DIAGNOSIS — O1002 Pre-existing essential hypertension complicating childbirth: Secondary | ICD-10-CM | POA: Diagnosis not present

## 2015-11-25 DIAGNOSIS — O34211 Maternal care for low transverse scar from previous cesarean delivery: Secondary | ICD-10-CM | POA: Diagnosis not present

## 2015-11-25 DIAGNOSIS — O321XX0 Maternal care for breech presentation, not applicable or unspecified: Principal | ICD-10-CM | POA: Diagnosis present

## 2015-11-25 DIAGNOSIS — D573 Sickle-cell trait: Secondary | ICD-10-CM | POA: Diagnosis present

## 2015-11-25 DIAGNOSIS — O99214 Obesity complicating childbirth: Secondary | ICD-10-CM | POA: Diagnosis not present

## 2015-11-25 DIAGNOSIS — Z6841 Body Mass Index (BMI) 40.0 and over, adult: Secondary | ICD-10-CM

## 2015-11-25 DIAGNOSIS — O4593 Premature separation of placenta, unspecified, third trimester: Secondary | ICD-10-CM | POA: Diagnosis present

## 2015-11-25 DIAGNOSIS — O471 False labor at or after 37 completed weeks of gestation: Secondary | ICD-10-CM

## 2015-11-25 DIAGNOSIS — Z8249 Family history of ischemic heart disease and other diseases of the circulatory system: Secondary | ICD-10-CM

## 2015-11-25 DIAGNOSIS — Z833 Family history of diabetes mellitus: Secondary | ICD-10-CM | POA: Diagnosis not present

## 2015-11-25 DIAGNOSIS — D649 Anemia, unspecified: Secondary | ICD-10-CM

## 2015-11-25 DIAGNOSIS — Z3689 Encounter for other specified antenatal screening: Secondary | ICD-10-CM

## 2015-11-25 DIAGNOSIS — Z98891 History of uterine scar from previous surgery: Secondary | ICD-10-CM

## 2015-11-25 HISTORY — DX: Benign neoplasm of connective and other soft tissue, unspecified: D21.9

## 2015-11-25 HISTORY — DX: Anemia, unspecified: D64.9

## 2015-11-25 LAB — CBC
HCT: 31.8 % — ABNORMAL LOW (ref 36.0–46.0)
HCT: 29.7 % — ABNORMAL LOW (ref 36.0–46.0)
HEMATOCRIT: 30.4 % — AB (ref 36.0–46.0)
HEMOGLOBIN: 9.7 g/dL — AB (ref 12.0–15.0)
HEMOGLOBIN: 10.2 g/dL — AB (ref 12.0–15.0)
Hemoglobin: 10.5 g/dL — ABNORMAL LOW (ref 12.0–15.0)
MCH: 21.8 pg — AB (ref 26.0–34.0)
MCH: 21.5 pg — AB (ref 26.0–34.0)
MCH: 23.6 pg — ABNORMAL LOW (ref 26.0–34.0)
MCHC: 33 g/dL (ref 30.0–36.0)
MCHC: 32.7 g/dL (ref 30.0–36.0)
MCHC: 33.6 g/dL (ref 30.0–36.0)
MCV: 66.1 fL — AB (ref 78.0–100.0)
MCV: 65.9 fL — ABNORMAL LOW (ref 78.0–100.0)
MCV: 70.2 fL — AB (ref 78.0–100.0)
PLATELETS: 257 10*3/uL (ref 150–400)
PLATELETS: 246 10*3/uL (ref 150–400)
Platelets: 209 10*3/uL (ref 150–400)
RBC: 4.81 MIL/uL (ref 3.87–5.11)
RBC: 4.51 MIL/uL (ref 3.87–5.11)
RBC: 4.33 MIL/uL (ref 3.87–5.11)
RDW: 16.1 % — AB (ref 11.5–15.5)
RDW: 16.1 % — ABNORMAL HIGH (ref 11.5–15.5)
RDW: 18.7 % — ABNORMAL HIGH (ref 11.5–15.5)
WBC: 6.6 10*3/uL (ref 4.0–10.5)
WBC: 6 10*3/uL (ref 4.0–10.5)
WBC: 16.5 10*3/uL — AB (ref 4.0–10.5)

## 2015-11-25 LAB — PREPARE RBC (CROSSMATCH)

## 2015-11-25 LAB — AMNISURE RUPTURE OF MEMBRANE (ROM) NOT AT ARMC: Amnisure ROM: NEGATIVE

## 2015-11-25 LAB — OB RESULTS CONSOLE GBS: STREP GROUP B AG: NEGATIVE

## 2015-11-25 SURGERY — Surgical Case
Anesthesia: Spinal | Wound class: Clean Contaminated

## 2015-11-25 MED ORDER — SIMETHICONE 80 MG PO CHEW: 80.0000 mg | CHEWABLE_TABLET | ORAL | Status: DC | PRN

## 2015-11-25 MED ORDER — PRENATAL MULTIVITAMIN CH
1.0000 | ORAL_TABLET | Freq: Every day | ORAL | Status: DC
  Administered 2015-11-26 – 2015-11-29 (×4): 1 via ORAL
  Filled 2015-11-25 (×4): qty 1

## 2015-11-25 MED ORDER — CEFAZOLIN SODIUM-DEXTROSE 2-3 GM-% IV SOLR
INTRAVENOUS | Status: DC | PRN
  Administered 2015-11-25 (×2): 2 g via INTRAVENOUS

## 2015-11-25 MED ORDER — OXYCODONE-ACETAMINOPHEN 5-325 MG PO TABS: 1.0000 | ORAL_TABLET | ORAL | Status: DC | PRN

## 2015-11-25 MED ORDER — SIMETHICONE 80 MG PO CHEW
80.0000 mg | CHEWABLE_TABLET | ORAL | Status: DC
  Administered 2015-11-27 – 2015-11-28 (×3): 80 mg via ORAL
  Filled 2015-11-25 (×3): qty 1

## 2015-11-25 MED ORDER — ONDANSETRON HCL 4 MG/2ML IJ SOLN
INTRAMUSCULAR | Status: AC
  Filled 2015-11-25: qty 2

## 2015-11-25 MED ORDER — EPHEDRINE 5 MG/ML INJ: 10.0000 mg | INTRAVENOUS | Status: DC | PRN

## 2015-11-25 MED ORDER — FENTANYL CITRATE (PF) 100 MCG/2ML IJ SOLN
INTRAMUSCULAR | Status: AC
  Filled 2015-11-25: qty 2

## 2015-11-25 MED ORDER — OXYTOCIN 40 UNITS IN LACTATED RINGERS INFUSION - SIMPLE MED
1.0000 m[IU]/min | INTRAVENOUS | Status: DC
  Administered 2015-11-25: 2 m[IU]/min via INTRAVENOUS

## 2015-11-25 MED ORDER — LACTATED RINGERS IV SOLN
INTRAVENOUS | Status: DC | PRN
  Administered 2015-11-25: 19:00:00 via INTRAVENOUS

## 2015-11-25 MED ORDER — DIBUCAINE 1 % RE OINT: 1.0000 "application " | TOPICAL_OINTMENT | RECTAL | Status: DC | PRN

## 2015-11-25 MED ORDER — HYDROMORPHONE HCL 1 MG/ML IJ SOLN
INTRAMUSCULAR | Status: AC
  Administered 2015-11-25: 0.5 mg via INTRAVENOUS
  Filled 2015-11-25: qty 1

## 2015-11-25 MED ORDER — NALBUPHINE HCL 10 MG/ML IJ SOLN: 5.0000 mg | INTRAMUSCULAR | Status: DC | PRN

## 2015-11-25 MED ORDER — ONDANSETRON HCL 4 MG/2ML IJ SOLN: 4.0000 mg | Freq: Four times a day (QID) | INTRAMUSCULAR | Status: DC | PRN

## 2015-11-25 MED ORDER — DIPHENHYDRAMINE HCL 25 MG PO CAPS: 25.0000 mg | ORAL_CAPSULE | ORAL | Status: DC | PRN

## 2015-11-25 MED ORDER — SOD CITRATE-CITRIC ACID 500-334 MG/5ML PO SOLN
30.0000 mL | ORAL | Status: DC | PRN
  Administered 2015-11-25: 30 mL via ORAL
  Filled 2015-11-25: qty 15

## 2015-11-25 MED ORDER — ACETAMINOPHEN 500 MG PO TABS
1000.0000 mg | ORAL_TABLET | Freq: Four times a day (QID) | ORAL | Status: AC
  Administered 2015-11-26 (×3): 1000 mg via ORAL
  Filled 2015-11-25 (×3): qty 2

## 2015-11-25 MED ORDER — HYDROMORPHONE HCL 1 MG/ML IJ SOLN
INTRAMUSCULAR | Status: AC
  Filled 2015-11-25: qty 1

## 2015-11-25 MED ORDER — PHENYLEPHRINE 8 MG IN D5W 100 ML (0.08MG/ML) PREMIX OPTIME
INJECTION | INTRAVENOUS | Status: DC | PRN
  Administered 2015-11-25: 40 ug/min via INTRAVENOUS

## 2015-11-25 MED ORDER — INFLUENZA VAC SPLIT QUAD 0.5 ML IM SUSY: 0.5000 mL | PREFILLED_SYRINGE | INTRAMUSCULAR | Status: DC

## 2015-11-25 MED ORDER — NALBUPHINE HCL 10 MG/ML IJ SOLN: 5.0000 mg | Freq: Once | INTRAMUSCULAR | Status: DC | PRN

## 2015-11-25 MED ORDER — ZOLPIDEM TARTRATE 5 MG PO TABS: 5.0000 mg | ORAL_TABLET | Freq: Every evening | ORAL | Status: DC | PRN

## 2015-11-25 MED ORDER — OXYTOCIN BOLUS FROM INFUSION: 500.0000 mL | Freq: Once | INTRAVENOUS | Status: DC

## 2015-11-25 MED ORDER — DIPHENHYDRAMINE HCL 25 MG PO CAPS: 25.0000 mg | ORAL_CAPSULE | Freq: Four times a day (QID) | ORAL | Status: DC | PRN

## 2015-11-25 MED ORDER — FENTANYL CITRATE (PF) 100 MCG/2ML IJ SOLN: 50.0000 ug | INTRAMUSCULAR | Status: DC | PRN

## 2015-11-25 MED ORDER — COCONUT OIL OIL: 1.0000 "application " | TOPICAL_OIL | Status: DC | PRN

## 2015-11-25 MED ORDER — DIPHENHYDRAMINE HCL 50 MG/ML IJ SOLN: 12.5000 mg | INTRAMUSCULAR | Status: DC | PRN

## 2015-11-25 MED ORDER — ONDANSETRON HCL 4 MG/2ML IJ SOLN
INTRAMUSCULAR | Status: DC | PRN
  Administered 2015-11-25: 4 mg via INTRAVENOUS

## 2015-11-25 MED ORDER — NALOXONE HCL 0.4 MG/ML IJ SOLN: 0.4000 mg | INTRAMUSCULAR | Status: DC | PRN

## 2015-11-25 MED ORDER — OXYCODONE HCL 5 MG PO TABS
10.0000 mg | ORAL_TABLET | ORAL | Status: DC | PRN
  Administered 2015-11-26 – 2015-11-29 (×15): 10 mg via ORAL
  Filled 2015-11-25 (×15): qty 2

## 2015-11-25 MED ORDER — PHENYLEPHRINE 40 MCG/ML (10ML) SYRINGE FOR IV PUSH (FOR BLOOD PRESSURE SUPPORT): 80.0000 ug | PREFILLED_SYRINGE | INTRAVENOUS | Status: DC | PRN

## 2015-11-25 MED ORDER — FLEET ENEMA 7-19 GM/118ML RE ENEM: 1.0000 | ENEMA | RECTAL | Status: DC | PRN

## 2015-11-25 MED ORDER — ACETAMINOPHEN 325 MG PO TABS: 650.0000 mg | ORAL_TABLET | ORAL | Status: DC | PRN

## 2015-11-25 MED ORDER — TERBUTALINE SULFATE 1 MG/ML IJ SOLN
0.2500 mg | Freq: Once | INTRAMUSCULAR | Status: AC
  Administered 2015-11-25: 0.25 mg via SUBCUTANEOUS
  Filled 2015-11-25: qty 1

## 2015-11-25 MED ORDER — OXYCODONE-ACETAMINOPHEN 5-325 MG PO TABS: 2.0000 | ORAL_TABLET | ORAL | Status: DC | PRN

## 2015-11-25 MED ORDER — SIMETHICONE 80 MG PO CHEW
80.0000 mg | CHEWABLE_TABLET | Freq: Three times a day (TID) | ORAL | Status: DC
  Administered 2015-11-26 – 2015-11-29 (×10): 80 mg via ORAL
  Filled 2015-11-25 (×9): qty 1

## 2015-11-25 MED ORDER — OXYTOCIN 10 UNIT/ML IJ SOLN
INTRAVENOUS | Status: DC | PRN
  Administered 2015-11-25: 40 [IU] via INTRAVENOUS

## 2015-11-25 MED ORDER — SODIUM CHLORIDE 0.9% FLUSH
3.0000 mL | INTRAVENOUS | Status: DC | PRN
  Administered 2015-11-29: 3 mL via INTRAVENOUS
  Filled 2015-11-25: qty 3

## 2015-11-25 MED ORDER — HYDROMORPHONE HCL 1 MG/ML IJ SOLN
INTRAMUSCULAR | Status: DC | PRN
  Administered 2015-11-25: 1 mg via INTRAVENOUS

## 2015-11-25 MED ORDER — ACETAMINOPHEN 325 MG PO TABS
650.0000 mg | ORAL_TABLET | ORAL | Status: DC | PRN
  Administered 2015-11-29: 650 mg via ORAL
  Filled 2015-11-25: qty 2

## 2015-11-25 MED ORDER — TETANUS-DIPHTH-ACELL PERTUSSIS 5-2.5-18.5 LF-MCG/0.5 IM SUSP
0.5000 mL | Freq: Once | INTRAMUSCULAR | Status: AC
  Administered 2015-11-28: 0.5 mL via INTRAMUSCULAR
  Filled 2015-11-25: qty 0.5

## 2015-11-25 MED ORDER — LACTATED RINGERS IV SOLN: 500.0000 mL | INTRAVENOUS | Status: DC | PRN

## 2015-11-25 MED ORDER — FENTANYL 2.5 MCG/ML BUPIVACAINE 1/10 % EPIDURAL INFUSION (WH - ANES): 14.0000 mL/h | INTRAMUSCULAR | Status: DC | PRN

## 2015-11-25 MED ORDER — SODIUM CHLORIDE 0.9 % IV SOLN: Freq: Once | INTRAVENOUS | Status: DC

## 2015-11-25 MED ORDER — OXYTOCIN 10 UNIT/ML IJ SOLN
INTRAMUSCULAR | Status: AC
  Filled 2015-11-25: qty 4

## 2015-11-25 MED ORDER — MEPERIDINE HCL 25 MG/ML IJ SOLN: 6.2500 mg | INTRAMUSCULAR | Status: DC | PRN

## 2015-11-25 MED ORDER — WITCH HAZEL-GLYCERIN EX PADS: 1.0000 "application " | MEDICATED_PAD | CUTANEOUS | Status: DC | PRN

## 2015-11-25 MED ORDER — SENNOSIDES-DOCUSATE SODIUM 8.6-50 MG PO TABS
2.0000 | ORAL_TABLET | ORAL | Status: DC
  Administered 2015-11-26 – 2015-11-28 (×4): 2 via ORAL
  Filled 2015-11-25 (×4): qty 2

## 2015-11-25 MED ORDER — ONDANSETRON HCL 4 MG/2ML IJ SOLN: 4.0000 mg | Freq: Three times a day (TID) | INTRAMUSCULAR | Status: DC | PRN

## 2015-11-25 MED ORDER — TERBUTALINE SULFATE 1 MG/ML IJ SOLN: 0.2500 mg | Freq: Once | INTRAMUSCULAR | Status: DC | PRN

## 2015-11-25 MED ORDER — LIDOCAINE HCL (PF) 1 % IJ SOLN: 30.0000 mL | INTRAMUSCULAR | Status: DC | PRN

## 2015-11-25 MED ORDER — OXYTOCIN 40 UNITS IN LACTATED RINGERS INFUSION - SIMPLE MED: 2.5000 [IU]/h | INTRAVENOUS | Status: AC

## 2015-11-25 MED ORDER — HYDROMORPHONE HCL 1 MG/ML IJ SOLN
0.2500 mg | INTRAMUSCULAR | Status: DC | PRN
  Administered 2015-11-25 (×2): 0.5 mg via INTRAVENOUS

## 2015-11-25 MED ORDER — EPHEDRINE 5 MG/ML INJ
INTRAVENOUS | Status: AC
  Filled 2015-11-25: qty 10

## 2015-11-25 MED ORDER — NALOXONE HCL 2 MG/2ML IJ SOSY
1.0000 ug/kg/h | PREFILLED_SYRINGE | INTRAVENOUS | Status: DC | PRN
  Filled 2015-11-25: qty 2

## 2015-11-25 MED ORDER — CEFAZOLIN SODIUM-DEXTROSE 2-4 GM/100ML-% IV SOLN
INTRAVENOUS | Status: AC
  Filled 2015-11-25: qty 100

## 2015-11-25 MED ORDER — LACTATED RINGERS IV SOLN
INTRAVENOUS | Status: DC | PRN
  Administered 2015-11-25 (×3): via INTRAVENOUS

## 2015-11-25 MED ORDER — OXYCODONE HCL 5 MG PO TABS: 5.0000 mg | ORAL_TABLET | ORAL | Status: DC | PRN

## 2015-11-25 MED ORDER — PHENYLEPHRINE 8 MG IN D5W 100 ML (0.08MG/ML) PREMIX OPTIME
INJECTION | INTRAVENOUS | Status: AC
  Filled 2015-11-25: qty 100

## 2015-11-25 MED ORDER — MENTHOL 3 MG MT LOZG: 1.0000 | LOZENGE | OROMUCOSAL | Status: DC | PRN

## 2015-11-25 MED ORDER — EPHEDRINE SULFATE 50 MG/ML IJ SOLN
INTRAMUSCULAR | Status: DC | PRN
  Administered 2015-11-25: 10 mg via INTRAVENOUS

## 2015-11-25 MED ORDER — SODIUM CHLORIDE 0.9 % IV SOLN
INTRAVENOUS | Status: DC | PRN
  Administered 2015-11-25: 20:00:00 via INTRAVENOUS

## 2015-11-25 MED ORDER — LACTATED RINGERS IV SOLN
INTRAVENOUS | Status: DC
  Administered 2015-11-25: 08:00:00 via INTRAVENOUS

## 2015-11-25 MED ORDER — LACTATED RINGERS IV SOLN: INTRAVENOUS | Status: DC

## 2015-11-25 MED ORDER — LACTATED RINGERS IV SOLN: 500.0000 mL | Freq: Once | INTRAVENOUS | Status: DC

## 2015-11-25 MED ORDER — MORPHINE SULFATE-NACL 0.5-0.9 MG/ML-% IV SOSY
PREFILLED_SYRINGE | INTRAVENOUS | Status: AC
  Filled 2015-11-25: qty 1

## 2015-11-25 MED ORDER — LACTATED RINGERS IV SOLN
INTRAVENOUS | Status: DC
  Administered 2015-11-25: 11:00:00 via INTRAVENOUS

## 2015-11-25 MED ORDER — IBUPROFEN 600 MG PO TABS
600.0000 mg | ORAL_TABLET | Freq: Four times a day (QID) | ORAL | Status: DC
  Administered 2015-11-26 – 2015-11-29 (×14): 600 mg via ORAL
  Filled 2015-11-25 (×14): qty 1

## 2015-11-25 MED ORDER — SCOPOLAMINE 1 MG/3DAYS TD PT72
1.0000 | MEDICATED_PATCH | Freq: Once | TRANSDERMAL | Status: DC
  Filled 2015-11-25: qty 1

## 2015-11-25 MED ORDER — OXYTOCIN 40 UNITS IN LACTATED RINGERS INFUSION - SIMPLE MED
2.5000 [IU]/h | INTRAVENOUS | Status: DC
  Filled 2015-11-25: qty 1000

## 2015-11-25 SURGICAL SUPPLY — 41 items
BARRIER ADHS 3X4 INTERCEED (GAUZE/BANDAGES/DRESSINGS) ×2 IMPLANT
BENZOIN TINCTURE PRP APPL 2/3 (GAUZE/BANDAGES/DRESSINGS) ×2 IMPLANT
CHLORAPREP W/TINT 26ML (MISCELLANEOUS) ×2 IMPLANT
CLAMP CORD UMBIL (MISCELLANEOUS) IMPLANT
CLOTH BEACON ORANGE TIMEOUT ST (SAFETY) ×2 IMPLANT
CLSR STERI-STRIP ANTIMIC 1/2X4 (GAUZE/BANDAGES/DRESSINGS) ×2 IMPLANT
CONTAINER PREFILL 10% NBF 15ML (MISCELLANEOUS) IMPLANT
DRSG OPSITE POSTOP 4X10 (GAUZE/BANDAGES/DRESSINGS) ×2 IMPLANT
DRSG PAD ABDOMINAL 8X10 ST (GAUZE/BANDAGES/DRESSINGS) ×2 IMPLANT
ELECT REM PT RETURN 9FT ADLT (ELECTROSURGICAL) ×2
ELECTRODE REM PT RTRN 9FT ADLT (ELECTROSURGICAL) ×1 IMPLANT
EXTRACTOR VACUUM M CUP 4 TUBE (SUCTIONS) IMPLANT
GAUZE SPONGE 4X4 12PLY STRL (GAUZE/BANDAGES/DRESSINGS) ×2 IMPLANT
GLOVE BIO SURGEON STRL SZ7.5 (GLOVE) ×2 IMPLANT
GLOVE BIOGEL PI IND STRL 7.0 (GLOVE) ×1 IMPLANT
GLOVE BIOGEL PI IND STRL 7.5 (GLOVE) ×1 IMPLANT
GLOVE BIOGEL PI INDICATOR 7.0 (GLOVE) ×1
GLOVE BIOGEL PI INDICATOR 7.5 (GLOVE) ×1
GOWN STRL REUS W/TWL LRG LVL3 (GOWN DISPOSABLE) ×4 IMPLANT
HEMOSTAT SURGICEL 2X3 (HEMOSTASIS) ×4 IMPLANT
HEMOSTAT SURGICEL 4X8 (HEMOSTASIS) ×2 IMPLANT
KIT ABG SYR 3ML LUER SLIP (SYRINGE) IMPLANT
NEEDLE HYPO 25X5/8 SAFETYGLIDE (NEEDLE) IMPLANT
NS IRRIG 1000ML POUR BTL (IV SOLUTION) ×2 IMPLANT
PACK C SECTION WH (CUSTOM PROCEDURE TRAY) ×2 IMPLANT
PAD ABD 7.5X8 STRL (GAUZE/BANDAGES/DRESSINGS) ×2 IMPLANT
PAD OB MATERNITY 4.3X12.25 (PERSONAL CARE ITEMS) ×2 IMPLANT
PENCIL SMOKE EVAC W/HOLSTER (ELECTROSURGICAL) ×2 IMPLANT
RTRCTR C-SECT PINK 25CM LRG (MISCELLANEOUS) ×2 IMPLANT
SPONGE LAP 18X18 X RAY DECT (DISPOSABLE) ×6 IMPLANT
STRIP CLOSURE SKIN 1/2X4 (GAUZE/BANDAGES/DRESSINGS) ×2 IMPLANT
SUT CHROMIC 2 0 CT 1 (SUTURE) ×2 IMPLANT
SUT MNCRL AB 3-0 PS2 27 (SUTURE) ×2 IMPLANT
SUT PLAIN 2 0 XLH (SUTURE) ×2 IMPLANT
SUT VIC AB 0 CT1 36 (SUTURE) ×4 IMPLANT
SUT VIC AB 0 CTX 36 (SUTURE) ×4
SUT VIC AB 0 CTX36XBRD ANBCTRL (SUTURE) ×4 IMPLANT
SUT VIC AB 2-0 SH 27 (SUTURE) ×4
SUT VIC AB 2-0 SH 27XBRD (SUTURE) ×4 IMPLANT
TOWEL OR 17X24 6PK STRL BLUE (TOWEL DISPOSABLE) ×2 IMPLANT
TRAY FOLEY CATH SILVER 14FR (SET/KITS/TRAYS/PACK) ×2 IMPLANT

## 2015-11-25 NOTE — Progress Notes (Addendum)
Brittney Sandoval is a 35 y.o. [id number] at [redacted]w[redacted]d admitted for induction due to unstable lie and chtn  Subjective: Feeling contractions more, denies HA, visual changes or abdominal pain  Objective: BP 136/82   Pulse 82   Temp 98.2 F (36.8 C) (Oral)   Resp 18   LMP 02/24/2015  No intake/output data recorded. No intake/output data recorded.  FHT:  FHR: 140s bpm, variability: min-mod ,  accelerations:  Present occasionally with small accels as well,  decelerations:  Absent UC:   regular, every 2-3 minutes SVE:   Dilation: 1.5 Effacement (%): Thick Station: Ballotable Exam by:: [employer]: Lab Results  Component Value Date   WBC 6.0 11/25/2015   HGB 9.7 (L) 11/25/2015   HCT 29.7 (L) 11/25/2015   MCV 65.9 (L) 11/25/2015   PLT 246 11/25/2015    Assessment / Plan: Induction and TOLAC  Labor: cont to titrate pitocin per protocol Preeclampsia:  no signs or symptoms of toxicity Fetal Wellbeing:  Category I Pain Control:  Labor support without medications I/D:  GBS neg Anticipated MOD:  NSVD  Nima Kemppainen Y 11/25/2015, 4:37 PM  Called by RN, Ileana Ladd secondary to patient having bleeding that was running down her leg.  Patient had earlier expressed that she had abdominal pain when just palpated the abdomen.  Pt has h/o c/s and has decided that she would prefer to proceed with c/s secondary to the risk of uterine rupture.  Tracing remains cat 1 and pitocin has been held.  R/B/A reviewed with the patient including but not limited to bleeding infection and injury.  Patient's questions answered and consent signed and witnessed.

## 2015-11-25 NOTE — Progress Notes (Addendum)
Brittney Sandoval [id number]  Subjective: Postpartum Day 1: Repeat C/S due to unexplained bleeding and unstable lie. Has not been up yet, reports no syncope or dizziness. Feeding: Breast. Contraceptive plan: IUD.  Objective: Temp:  [97.4 F (36.3 C)-98.5 F (36.9 C)] 98.2 F (36.8 C) (09/28 0345) Pulse Rate:  [70-113] 93 (09/28 0345) Resp:  [9-29] 20 (09/28 0345) BP: (97-180)/(65-109) 133/83 (09/28 0345) SpO2:  [94 %-100 %] 100 % (09/28 0345) Weight:  [106.1 kg (234 lb)] 106.1 kg (234 lb) (09/27 2307)  CBC Latest Ref Rng & Units 11/26/2015 11/25/2015 11/25/2015  WBC 4.0 - 10.5 K/uL 15.5(H) 16.5(H) 6.0  Hemoglobin 12.0 - 15.0 g/dL 8.7(L) 10.2(L) 9.7(L)  Hematocrit 36.0 - 46.0 % 25.4(L) 30.4(L) 29.7(L)  Platelets 150 - 400 K/uL 194 209 246   Adequate uop  Physical Exam:  General: alert, cooperative and no distress Lochia: appropriate Uterine Fundus: firm Abdomen:  + bowel sounds, NTND Incision: Dressing CDI DVT Evaluation: No evidence of DVT seen on physical exam. Negative Homan's sign. No cords or calf tenderness. No significant calf/ankle edema.  Assessment/Plan: Status post cesarean delivery, day 1. Stable. Asymptomatic anemia - start Iron and Colace bid. Continue routine postop care. Remove pressure dressing. Early ambulation.     Farrel Gordon, CNM 11/26/2015, 5:58 AM

## 2015-11-25 NOTE — Op Note (Signed)
Cesarean Section Procedure Note  Indications: P4 at 64 1/7wks admitted for induction of patient with [facility], unstable lie and now with unexplained bleeding.  Pt would like to proceed with repeat c/s and R/B/A were discussed.  Pre-operative Diagnosis: 1.39 1/7wks 2.h/o C-Section 3.Bleeding 4.Unstable lie 5.CHTN   Post-operative Diagnosis: 1.39 1/7wks 2.h/o C-Section 3.Bleeding 4.Unstable lie 5.CHTN 6.Breech Extraction 7.Partial Abruption  Procedure: REPEAT CESAREAN SECTION  Surgeon: Everett Graff, MD    Assistants: Farrel Gordon, CNM Claretta Fraise, RNFA  Anesthesia: Spinal  Anesthesiologist: Lyndle Herrlich, MD   Procedure Details  The patient was taken to the operating room secondary to bleeding and h/o c-section after the risks, benefits, complications, treatment options, and expected outcomes were discussed with the patient.  The patient concurred with the proposed plan, giving informed consent which was signed and witnessed. The patient was taken to Operating Room C-Section Suite, identified as Brittney Sandoval and the procedure verified as C-Section Delivery. A Time Out was held and the above information confirmed.  After induction of anesthesia by obtaining a spinal, the patient was prepped and draped in the usual sterile manner. A Pfannenstiel skin incision was made and carried down through the subcutaneous tissue to the underlying layer of fascia.  The fascia was incised bilaterally and extended transversely bilaterally with the Mayo scissors. Kocher clamps were placed on the inferior aspect of the fascial incision and the underlying rectus muscle was separated from the fascia. The same was done on the superior aspect of the fascial incision.  The peritoneum was identified, entered bluntly and extended manually.  An Alexis self-retaining retractor was placed.  The utero-vesical peritoneal reflection was incised transversely and sharply freed from the lower uterine segment as  there was several adhesions. A thick band of scar tissue was excised from the uterus to the bladder.  A low transverse uterine incision was made with the scalpel and extended bilaterally with the bandage scissors.  The infant was delivered via breech extraction.  After the umbilical cord was clamped and cut, the infant was handed to the awaiting pediatricians.  Cord blood was obtained for evaluation.  The placenta was removed and appeared to have a partial abruption and sent to pathology. The uterus was cleared of all clots and debris. The uterus was hypervascular and at the time of uterine incision the patient bled heavily from the incision until it was finally closed.  The uterine incision was closed with running interlocking sutures of 0 Vicryl and a second imbricating layer was performed as well.   Several hemostatic stitches were placed.  The anterior uterine wall was oozing from site where scar was excised and several stitches were placed.  Surgicel was applied. The left fallopian tube and ovary appeared to be within normal limits and was easily visualized.  I did not visualize the right fallopian tube.  Good hemostasis was noted after placing surgicel.  Copious irrigation was performed until clear prior to placing surgicel.  The peritoneum was repaired with 2-0 chromic via a running suture.  The fascia was reapproximated with a running suture of 0 Vicryl. The subcutaneous tissue was reapproximated with 4 interrupted sutures of 2-0 plain.  The skin was reapproximated with a subcuticular suture of 3-0 monocryl.  Steristrips were applied.  Honeycomb and pressure dressings applied.  Instrument, sponge, and needle counts were correct prior to abdominal closure and at the conclusion of the case.  The patient was awaiting transfer to the recovery room in good condition.  Findings: Live female  infant with Apgars 8 at one minute and 9 at five minutes.  Normal appearing left ovary and fallopian tube was noted.  I  did not visualize the right side.  Estimated Blood Loss:  3000 ml         Drains: foley to gravity 300 cc         Total IV Fluids: 3600 ml         Specimens to Pathology: Placenta         Complications:  None; patient tolerated the procedure well.         Disposition: PACU - hemodynamically stable.         Condition: stable  Attending Attestation: I performed the procedure.

## 2015-11-25 NOTE — Transfer of Care (Signed)
Immediate Anesthesia Transfer of Care Note  Patient: Brittney Sandoval  Procedure(s) Performed: Procedure(s): CESAREAN SECTION (N/A)  Patient Location: PACU  Anesthesia Type:Spinal  Level of Consciousness: awake, alert  and oriented  Airway & Oxygen Therapy: Patient Spontanous Breathing  Post-op Assessment: Report given to RN and Post -op Vital signs reviewed and stable  Post vital signs: Reviewed and stable  Last Vitals:  Vitals:   11/25/15 1802 11/25/15 1804  BP: (!) 180/109 (!) 146/79  Pulse: 77 79  Resp: (!) 22   Temp:      Last Pain:  Vitals:   11/25/15 1532  TempSrc:   PainSc: 8          Complications: No apparent anesthesia complications

## 2015-11-25 NOTE — Discharge Instructions (Signed)
Braxton Hicks Contractions °Contractions of the uterus can occur throughout pregnancy. Contractions are not always a sign that you are in labor.  °WHAT ARE BRAXTON HICKS CONTRACTIONS?  °Contractions that occur before labor are called Braxton Hicks contractions, or false labor. Toward the end of pregnancy (32-34 weeks), these contractions can develop more often and may become more forceful. This is not true labor because these contractions do not result in opening (dilatation) and thinning of the cervix. They are sometimes difficult to tell apart from true labor because these contractions can be forceful and people have different pain tolerances. You should not feel embarrassed if you go to the hospital with false labor. Sometimes, the only way to tell if you are in true labor is for your health care provider to look for changes in the cervix. °If there are no prenatal problems or other health problems associated with the pregnancy, it is completely safe to be sent home with false labor and await the onset of true labor. °HOW CAN YOU TELL THE DIFFERENCE BETWEEN TRUE AND FALSE LABOR? °False Labor °· The contractions of false labor are usually shorter and not as hard as those of true labor.   °· The contractions are usually irregular.   °· The contractions are often felt in the front of the lower abdomen and in the groin.   °· The contractions may go away when you walk around or change positions while lying down.   °· The contractions get weaker and are shorter lasting as time goes on.   °· The contractions do not usually become progressively stronger, regular, and closer together as with true labor.   °True Labor °· Contractions in true labor last 30-70 seconds, become very regular, usually become more intense, and increase in frequency.   °· The contractions do not go away with walking.   °· The discomfort is usually felt in the top of the uterus and spreads to the lower abdomen and low back.   °· True labor can be  determined by your health care provider with an exam. This will show that the cervix is dilating and getting thinner.   °WHAT TO REMEMBER °· Keep up with your usual exercises and follow other instructions given by your health care provider.   °· Take medicines as directed by your health care provider.   °· Keep your regular prenatal appointments.   °· Eat and drink lightly if you think you are going into labor.   °· If Braxton Hicks contractions are making you uncomfortable:   °¨ Change your position from lying down or resting to walking, or from walking to resting.   °¨ Sit and rest in a tub of warm water.   °¨ Drink 2-3 glasses of water. Dehydration may cause these contractions.   °¨ Do slow and deep breathing several times an hour.   °WHEN SHOULD I SEEK IMMEDIATE MEDICAL CARE? °Seek immediate medical care if: °· Your contractions become stronger, more regular, and closer together.   °· You have fluid leaking or gushing from your vagina.   °· You have a fever.   °· You pass blood-tinged mucus.   °· You have vaginal bleeding.   °· You have continuous abdominal pain.   °· You have low back pain that you never had before.   °· You feel your baby's head pushing down and causing pelvic pressure.   °· Your baby is not moving as much as it used to.   °  °This information is not intended to replace advice given to you by your health care provider. Make sure you discuss any questions you have with your health care   provider. °  °Document Released: 02/14/2005 Document Revised: 02/19/2013 Document Reviewed: 11/26/2012 °Elsevier Interactive Patient Education ©2016 [employer] ° °

## 2015-11-25 NOTE — H&P (Addendum)
Brittney Sandoval is a 35 y.o. female presenting for external cephalic version after she was found to be transverse yesterday in the office.  Options were discussed and pt wanted ECV and immediate induction due to unstable lie.  The baby has turned from transverse to cephalic and back to transverse over the last couple of weeks.  Upon presentation this morning however, the baby was cephalic and pt still wanted to be induced and declined observation for spontaneously labor.  OB History    Gravida Para Term Preterm AB Living   5 4 4     4    SAB TAB Ectopic Multiple Live Births           4     Past Medical History:  Diagnosis Date  . Anemia   .  Fibroid Sickle Cell Trait   . Hypertension - no meds during the pregnancy    Past Surgical History:  Procedure Laterality Date  . CESAREAN SECTION N/A 04/04/2013   Procedure: CESAREAN SECTION;  Surgeon: Brittney Filbert, MD;  Location: [facility];  Service: Obstetrics;  Laterality: N/A;  . LAPAROSCOPIC CHOLECYSTECTOMY  2010   [place], [place]  . TONSILLECTOMY     Family History: family history includes Diabetes in her mother; Heart disease in her mother; Hypertension in her mother; Kidney disease in her mother. Social History:  reports that she has never smoked. She has never used smokeless tobacco. She reports that she drinks alcohol. She reports that she does not use drugs.     Maternal Diabetes: No Genetic Screening: Normal Panorama after increased risk for DS and neg AFP Maternal Ultrasounds/Referrals: Normal Fetal Ultrasounds or other Referrals:  None Maternal Substance Abuse:  No Significant Maternal Medications:  None Significant Maternal Lab Results:  Lab values include: Group B Strep negative Other Comments:  None  ROS  Non-contributory and no concerns for this pregnancy related to her previous cyst that was drained  History Dilation: 1 Effacement (%): Thick Station: Ballotable Exam by:: [place] Blood pressure (!) 145/88,  pulse 80, temperature 97.8 F (36.6 C), temperature source Oral, resp. rate 18, last menstrual period 02/24/2015, currently breastfeeding. Exam Physical Exam  Lungs CTA  CV RRR Abd gravid NT Ext no calf tenderness  FHT 140s, +accels, no decels, mod variability Toco occasional  Prenatal labs: ABO, Rh: --/--/A POS (09/27 [id number]) Antibody: NEG (09/27 [id number]) Rubella:  Immune RPR:   NR HBsAg:   Neg HIV:   NR GBS:   Negative  EFW a couple of weeks ago 7lbs 9oz, 89%, [id number] U/S today cephalic and AFI 16  Assessment/Plan: P4 at 6 1/7wks with h/o CHTN on no meds and unstable lie now cephalic when patient presented for ECV today.  Pt continues to want induction and R/B/A reviewed including but not limited to increased risk for uterine rupture with induction and increased risk for c/s especially with unfavorable cervix.  Pt however had 3 vaginal deliveries and then a c-section secondary to breech. Fetal status is overall reassuring with cat 1 tracing.  Will start pitocin.  Pain medicine upon request.  MFM recommended delivery at 38-39wks as pt has h/o preeclampsia and had been on a baby ASA.  Brittney Sandoval Y 11/25/2015, 11:55 AM

## 2015-11-25 NOTE — MAU Note (Signed)
PT  SAYS SROM    AT 2315-   CLEAR  FLUID.   - WHILE  SHE WAS  SITTING -  THEN REALIZED HER UNDERWEAR WERE  WET .     CONTINUED   TO  FEEL  LEAKING    .      YESTERDAY  WAS IN OFFICE  - VE  1 CM .   Marland Kitchen   PT IS  [facility] FOR  VERSION  AT  0700- BABY IS     OBLIQUE   .  LAST SEX-  AUG. GBS-  NEG.     DENIES HSV AND MRSA.

## 2015-11-25 NOTE — MAU Note (Signed)
Pt reports contractions and ?leaking fluid since 2315.

## 2015-11-25 NOTE — Plan of Care (Signed)
Problem: Life Cycle: Goal: Risk for postpartum hemorrhage will decrease Outcome: Progressing ppHemm in OR- received 2 units PRBCs  Problem: Nutritional: Goal: Dietary intake will improve Outcome: Not Progressing N/V  Problem: Urinary Elimination: Goal: Ability to reestablish a normal urinary elimination pattern will improve Foley - in place

## 2015-11-25 NOTE — MAU Note (Signed)
Pt here for external  Version.

## 2015-11-25 NOTE — Anesthesia Procedure Notes (Signed)
Spinal  Patient location during procedure: OR Preanesthetic Checklist Completed: patient identified, site marked, surgical consent, pre-op evaluation, timeout performed, IV checked, risks and benefits discussed and monitors and equipment checked Spinal Block Patient position: sitting Prep: DuraPrep Patient monitoring: heart rate, cardiac monitor, continuous pulse ox and blood pressure Approach: midline Location: L3-4 Injection technique: single-shot Needle Needle type: Sprotte  Needle gauge: 24 G Needle length: 9 cm Assessment Sensory level: T4 Additional Notes Spinal Dosage in OR  Bupivicaine ml       1.6 PFMS04   mcg        100 Fentanyl mcg            25

## 2015-11-25 NOTE — Anesthesia Preprocedure Evaluation (Signed)
Anesthesia Evaluation  Patient identified by MRN, date of birth, ID band Patient awake    Reviewed: Allergy & Precautions, H&P , NPO status , Patient's Chart, lab work & pertinent test results  Airway Mallampati: III  TM Distance: >3 FB Neck ROM: full    Dental no notable dental hx.    Pulmonary    Pulmonary exam normal breath sounds clear to auscultation       Cardiovascular Exercise Tolerance: Good hypertension,  Rhythm:regular Rate:Normal     Neuro/Psych    GI/Hepatic   Endo/Other  Morbid obesity  Renal/GU      Musculoskeletal   Abdominal   Peds  Hematology  (+) anemia ,   Anesthesia Other Findings   Reproductive/Obstetrics (+) Pregnancy                            Anesthesia Physical  Anesthesia Plan  ASA: III and emergent  Anesthesia Plan: Spinal   Post-op Pain Management:    Induction:   Airway Management Planned:   Additional Equipment:   Intra-op Plan:   Post-operative Plan:   Informed Consent: I have reviewed the patients History and Physical, chart, labs and discussed the procedure including the risks, benefits and alternatives for the proposed anesthesia with the patient or authorized representative who has indicated his/her understanding and acceptance.     Plan Discussed with: Anesthesiologist, CRNA and Surgeon  Anesthesia Plan Comments:         Anesthesia Quick Evaluation

## 2015-11-26 ENCOUNTER — Encounter (HOSPITAL_COMMUNITY): Payer: Self-pay | Admitting: Obstetrics and Gynecology

## 2015-11-26 DIAGNOSIS — Z98891 History of uterine scar from previous surgery: Secondary | ICD-10-CM

## 2015-11-26 LAB — CBC
HEMATOCRIT: 25.4 % — AB (ref 36.0–46.0)
HEMOGLOBIN: 8.7 g/dL — AB (ref 12.0–15.0)
MCH: 23.6 pg — AB (ref 26.0–34.0)
MCHC: 34.3 g/dL (ref 30.0–36.0)
MCV: 68.8 fL — ABNORMAL LOW (ref 78.0–100.0)
Platelets: 194 10*3/uL (ref 150–400)
RBC: 3.69 MIL/uL — ABNORMAL LOW (ref 3.87–5.11)
RDW: 17.9 % — ABNORMAL HIGH (ref 11.5–15.5)
WBC: 15.5 10*3/uL — ABNORMAL HIGH (ref 4.0–10.5)

## 2015-11-26 LAB — RPR: RPR Ser Ql: NONREACTIVE

## 2015-11-26 MED ORDER — POLYETHYLENE GLYCOL 3350 17 G PO PACK
17.0000 g | PACK | Freq: Every day | ORAL | Status: DC | PRN
  Administered 2015-11-26 – 2015-11-28 (×4): 17 g via ORAL
  Filled 2015-11-26 (×5): qty 1

## 2015-11-26 MED ORDER — DOCUSATE SODIUM 100 MG PO CAPS
100.0000 mg | ORAL_CAPSULE | Freq: Two times a day (BID) | ORAL | Status: DC
  Administered 2015-11-26 – 2015-11-29 (×7): 100 mg via ORAL
  Filled 2015-11-26 (×7): qty 1

## 2015-11-26 MED ORDER — FERROUS SULFATE 325 (65 FE) MG PO TABS
325.0000 mg | ORAL_TABLET | Freq: Two times a day (BID) | ORAL | Status: DC
  Administered 2015-11-26 – 2015-11-29 (×7): 325 mg via ORAL
  Filled 2015-11-26 (×7): qty 1

## 2015-11-26 NOTE — Plan of Care (Signed)
Problem: Health Behavior/Discharge Planning: Goal: Ability to manage health-related needs will improve Discussed plan today to attempt to ambulate after patient eats breakfast and to attempt to progress activity. Encouraged pt not to get out of bed without assistance from staff members. Patient doing very well caring for baby even though she has been unable to get out of bed yet without feeling dizzy.

## 2015-11-26 NOTE — Lactation Note (Signed)
This note was copied from a baby's chart. Lactation Consultation Note Experienced BF mom BF her now 35 yr old for 8 months. Her 35 yr old for 3 months.  Mom has HUGE pendulum breast w/small compressible nipple. Baby has been BF well. Mom laying baby in lap and BF baby on the nipple. Hand expression colostrum. Mom encouraged to feed baby 8-12 times/24 hours and with feeding cues. Mom knows to pump q3h for 15-20 min.Referred to Baby and Me Book in Breastfeeding section Pg. 22-23 for position options and Proper latch demonstration. Educated about newborn behavior, I&O, supply and demand. Mom encouraged to waken baby for feeds. Mom encouraged to do skin-to-skin.[name] brochure given w/resources, support groups and [place] services. Mom had a 3,072ml blood loss in surgery. Explained to mom importance of STS, I&O.  Mom states she is very tired encouraged to drink fluids and rest. Mother informed of post-discharge support and given phone number to the lactation department, including services for phone call assistance; out-patient appointments; and breastfeeding support group. List of other breastfeeding resources in the community given in the handout. Encouraged mother to call for problems or concerns related to breastfeeding. Patient Name: Brittney Sandoval   [id number] Date: 11/26/2015 Reason for consult: Initial assessment   Maternal Data Does the patient have breastfeeding experience prior to this delivery?: Yes  Feeding Feeding Type: Breast Fed Length of feed: 15 min  LATCH Score/Interventions Latch: Grasps breast easily, tongue down, lips flanged, rhythmical sucking.  Audible Swallowing: A few with stimulation Intervention(s): Hand expression;Alternate breast massage  Type of Nipple: Everted at rest and after stimulation (semi flat, compressible)  Comfort (Breast/Nipple): Soft / non-tender     Hold (Positioning): Assistance needed to correctly position infant at breast and maintain  latch. Intervention(s): Breastfeeding basics reviewed;Support Pillows;Position options;Skin to skin  LATCH Score: 8  Lactation Tools Discussed/Used Tools: Pump Breast pump type: Double-Electric Breast Pump WIC Program: Yes Pump Review: Setup, frequency, and cleaning;Milk Storage Initiated by:: Allayne Stack RN IBCLC Date initiated:: 11/26/15   Consult Status Consult Status: Follow-up Date: 11/27/15 Follow-up type: In-patient    Tiffanni Scarfo, [place]ta [place] 11/26/2015, 5:15 AM

## 2015-11-26 NOTE — Anesthesia Postprocedure Evaluation (Signed)
Anesthesia Post Note  Patient: Brittney Sandoval  Procedure(s) Performed: Procedure(s) (LRB): CESAREAN SECTION (N/A)  Patient location during evaluation: Mother Baby Anesthesia Type: Spinal Level of consciousness: awake and alert Pain management: pain level controlled Vital Signs Assessment: post-procedure vital signs reviewed and stable Respiratory status: spontaneous breathing and nonlabored ventilation Cardiovascular status: stable Postop Assessment: no headache, patient able to bend at knees, no backache, no signs of nausea or vomiting, adequate PO intake and spinal receding Anesthetic complications: no     Last Vitals:  Vitals:   11/25/15 2347 11/26/15 0345  BP: 131/84 133/83  Pulse: 79 93  Resp: 20 20  Temp: 36.9 C 36.8 C    Last Pain:  Vitals:   11/26/15 0345  TempSrc: Oral  PainSc: 3    Pain Goal: Patients Stated Pain Goal: 1 (11/26/15 0345)               Jabier Mutton

## 2015-11-27 DIAGNOSIS — D649 Anemia, unspecified: Secondary | ICD-10-CM

## 2015-11-27 LAB — RUBELLA SCREEN: Rubella: 1.38 index (ref >0.99)

## 2015-11-27 MED ORDER — OXYCODONE HCL 5 MG PO TABS: 5.0000 mg | ORAL_TABLET | ORAL | 0 refills | Status: DC | PRN

## 2015-11-27 NOTE — Progress Notes (Signed)
Subjective: Postpartum Day 2  Repeat LTCS, due to unstable lie,  Pt had PPH and obtained 2 U of PRBC. Patient up ad lib, reports no syncope or slight dizziness. Feeding:  breast Contraceptive plan:  IUD  Objective: Vital signs in last 24 hours: Temp:  [98.1 F (36.7 C)-98.6 F (37 C)] 98.5 F (36.9 C) (09/29 0559) Pulse Rate:  [96-99] 97 (09/29 0559) Resp:  [18-20] 18 (09/29 0559) BP: (122-139)/(63-86) 127/86 (09/29 0559) SpO2:  [99 %-100 %] 99 % (09/28 1915)  Physical Exam:  General: alert and no distress Lochia: appropriate Uterine Fundus: firm Incision: healing well, covered, no signs of reddness DVT Evaluation: No evidence of DVT seen on physical exam. Negative Homan's sign.   CBC Latest Ref Rng & Units 11/26/2015 11/25/2015 11/25/2015  WBC 4.0 - 10.5 K/uL 15.5(H) 16.5(H) 6.0  Hemoglobin 12.0 - 15.0 g/dL 8.7(L) 10.2(L) 9.7(L)  Hematocrit 36.0 - 46.0 % 25.4(L) 30.4(L) 29.7(L)  Platelets 150 - 400 K/uL 194 209 246     Assessment/Plan: Status post LTCSday 2. Stable Continue current care. Plan for discharge tomorrow    Brittney Koch ProtheroCNM 11/27/2015, 10:27 AM  I saw and examined patient at bedside and agree with above findings assessment and plan. Patient denies any lightheadedness or dizziness with ambulation. She has scant lochia. Plan for discharge tomorrow 11/28/15.  Dr. Alesia Richards.

## 2015-11-27 NOTE — Discharge Instructions (Signed)
Breastfeeding and Mastitis Mastitis is inflammation of the breast tissue. It can occur in women who are breastfeeding. This can make breastfeeding painful. Mastitis will sometimes go away on its own. Your health care provider will help determine if treatment is needed. CAUSES Mastitis is often associated with a blocked milk (lactiferous) duct. This can happen when too much milk builds up in the breast. Causes of excess milk in the breast can include:  Poor latch-on. If your baby is not latched onto the breast properly, she or he may not empty your breast completely while breastfeeding.  Allowing too much time to pass between feedings.  Wearing a bra or other clothing that is too tight. This puts extra pressure on the lactiferous ducts so milk does not flow through them as it should. Mastitis can also be caused by a bacterial infection. Bacteria may enter the breast tissue through cuts or openings in the skin. In women who are breastfeeding, this may occur because of cracked or irritated skin. Cracks in the skin are often caused when your baby does not latch on properly to the breast. SIGNS AND SYMPTOMS  Swelling, redness, tenderness, and pain in an area of the breast.  Swelling of the glands under the arm on the same side.  Fever may or may not accompany mastitis. If an infection is allowed to progress, a collection of pus (abscess) may develop. DIAGNOSIS  Your health care provider can usually diagnose mastitis based on your symptoms and a physical exam. Tests may be done to help confirm the diagnosis. These may include:  Removal of pus from the breast by applying pressure to the area. This pus can be examined in the lab to determine which bacteria are present. If an abscess has developed, the fluid in the abscess can be removed with a needle. This can also be used to confirm the diagnosis and determine the bacteria present. In most cases, pus will not be present.  Blood tests to determine if  your body is fighting a bacterial infection.  Mammogram or ultrasound tests to rule out other problems or diseases. TREATMENT  Mastitis that occurs with breastfeeding will sometimes go away on its own. Your health care provider may choose to wait 24 hours after first seeing you to decide whether a prescription medicine is needed. If your symptoms are worse after 24 hours, your health care provider will likely prescribe an antibiotic medicine to treat the mastitis. He or she will determine which bacteria are most likely causing the infection and will then select an appropriate antibiotic medicine. This is sometimes changed based on the results of tests performed to identify the bacteria, or if there is no response to the antibiotic medicine selected. Antibiotic medicines are usually given by mouth. You may also be given medicine for pain. HOME CARE INSTRUCTIONS  Only take over-the-counter or prescription medicines for pain, fever, or discomfort as directed by your health care provider.  If your health care provider prescribed an antibiotic medicine, take the medicine as directed. Make sure you finish it even if you start to feel better.  Do not wear a tight or underwire bra. Wear a soft, supportive bra.  Increase your fluid intake, especially if you have a fever.  Continue to empty the breast. Your health care provider can tell you whether this milk is safe for your infant or needs to be thrown out. You may be told to stop nursing until your health care provider thinks it is safe for your baby.  Use a breast pump if you are advised to stop nursing.  Keep your nipples clean and dry.  Empty the first breast completely before going to the other breast. If your baby is not emptying your breasts completely for some reason, use a breast pump to empty your breasts.  If you go back to work, pump your breasts while at work to stay in time with your nursing schedule.  Avoid allowing your breasts to become  overly filled with milk (engorged). SEEK MEDICAL CARE IF:  You have pus-like discharge from the breast.  Your symptoms do not improve with the treatment prescribed by your health care provider within 2 days. SEEK IMMEDIATE MEDICAL CARE IF:  Your pain and swelling are getting worse.  You have pain that is not controlled with medicine.  You have a red line extending from the breast toward your armpit.  You have a fever or persistent symptoms for more than 2-3 days.  You have a fever and your symptoms suddenly get worse. MAKE SURE YOU:   Understand these instructions.  Will watch your condition.  Will get help right away if you are not doing well or get worse.   This information is not intended to replace advice given to you by your health care provider. Make sure you discuss any questions you have with your health care provider.   Document Released: 06/11/2004 Document Revised: 02/19/2013 Document Reviewed: 09/20/2012 Elsevier Interactive Patient Education [employer]. Breastfeeding Deciding to breastfeed is one of the best choices you can make for you and your baby. A change in hormones during pregnancy causes your breast tissue to grow and increases the number and size of your milk ducts. These hormones also allow proteins, sugars, and fats from your blood supply to make breast milk in your milk-producing glands. Hormones prevent breast milk from being released before your baby is born as well as prompt milk flow after birth. Once breastfeeding has begun, thoughts of your baby, as well as his or her sucking or crying, can stimulate the release of milk from your milk-producing glands.  BENEFITS OF BREASTFEEDING For Your Baby  Your first milk (colostrum) helps your baby's digestive system function better.  There are antibodies in your milk that help your baby fight off infections.  Your baby has a lower incidence of asthma, allergies, and sudden infant death  syndrome.  The nutrients in breast milk are better for your baby than infant formulas and are designed uniquely for your baby's needs.  Breast milk improves your baby's brain development.  Your baby is less likely to develop other conditions, such as childhood obesity, asthma, or type 2 diabetes mellitus. For You  Breastfeeding helps to create a very special bond between you and your baby.  Breastfeeding is convenient. Breast milk is always available at the correct temperature and costs nothing.  Breastfeeding helps to burn calories and helps you lose the weight gained during pregnancy.  Breastfeeding makes your uterus contract to its prepregnancy size faster and slows bleeding (lochia) after you give birth.   Breastfeeding helps to lower your risk of developing type 2 diabetes mellitus, osteoporosis, and breast or ovarian cancer later in life. SIGNS THAT YOUR BABY IS HUNGRY Early Signs of Hunger  Increased alertness or activity.  Stretching.  Movement of the head from side to side.  Movement of the head and opening of the mouth when the corner of the mouth or cheek is stroked (rooting).  Increased sucking sounds, smacking lips, cooing,  sighing, or squeaking.  Hand-to-mouth movements.  Increased sucking of fingers or hands. Late Signs of Hunger  Fussing.  Intermittent crying. Extreme Signs of Hunger Signs of extreme hunger will require calming and consoling before your baby will be able to breastfeed successfully. Do not wait for the following signs of extreme hunger to occur before you initiate breastfeeding:  Restlessness.  A loud, strong cry.  Screaming. BREASTFEEDING BASICS Breastfeeding Initiation  Find a comfortable place to sit or lie down, with your neck and back well supported.  Place a pillow or rolled up blanket under your baby to bring him or her to the level of your breast (if you are seated). Nursing pillows are specially designed to help support  your arms and your baby while you breastfeed.  Make sure that your baby's abdomen is facing your abdomen.  Gently massage your breast. With your fingertips, massage from your chest wall toward your nipple in a circular motion. This encourages milk flow. You may need to continue this action during the feeding if your milk flows slowly.  Support your breast with 4 fingers underneath and your thumb above your nipple. Make sure your fingers are well away from your nipple and your baby's mouth.  Stroke your baby's lips gently with your finger or nipple.  When your baby's mouth is open wide enough, quickly bring your baby to your breast, placing your entire nipple and as much of the colored area around your nipple (areola) as possible into your baby's mouth.  More areola should be visible above your baby's upper lip than below the lower lip.  Your baby's tongue should be between his or her lower gum and your breast.  Ensure that your baby's mouth is correctly positioned around your nipple (latched). Your baby's lips should create a seal on your breast and be turned out (everted).  It is common for your baby to suck about 2-3 minutes in order to start the flow of breast milk. Latching Teaching your baby how to latch on to your breast properly is very important. An improper latch can cause nipple pain and decreased milk supply for you and poor weight gain in your baby. Also, if your baby is not latched onto your nipple properly, he or she may swallow some air during feeding. This can make your baby fussy. Burping your baby when you switch breasts during the feeding can help to get rid of the air. However, teaching your baby to latch on properly is still the best way to prevent fussiness from swallowing air while breastfeeding. Signs that your baby has successfully latched on to your nipple:  Silent tugging or silent sucking, without causing you pain.  Swallowing heard between every 3-4  sucks.  Muscle movement above and in front of his or her ears while sucking. Signs that your baby has not successfully latched on to nipple:  Sucking sounds or smacking sounds from your baby while breastfeeding.  Nipple pain. If you think your baby has not latched on correctly, slip your finger into the corner of your baby's mouth to break the suction and place it between your baby's gums. Attempt breastfeeding initiation again. Signs of Successful Breastfeeding Signs from your baby:  A gradual decrease in the number of sucks or complete cessation of sucking.  Falling asleep.  Relaxation of his or her body.  Retention of a small amount of milk in his or her mouth.  Letting go of your breast by himself or herself. Signs from you:  Breasts that have increased in firmness, weight, and size 1-3 hours after feeding.  Breasts that are softer immediately after breastfeeding.  Increased milk volume, as well as a change in milk consistency and color by the fifth day of breastfeeding.  Nipples that are not sore, cracked, or bleeding. Signs That Your [employer] is Getting Enough Milk  Wetting at least 3 diapers in a 24-hour period. The urine should be clear and pale yellow by age 66 days.  At least 3 stools in a 24-hour period by age 66 days. The stool should be soft and yellow.  At least 3 stools in a 24-hour period by age 80 days. The stool should be seedy and yellow.  No loss of weight greater than 10% of birth weight during the first 1 days of age.  Average weight gain of 4-7 ounces (113-198 g) per week after age 30 days.  Consistent daily weight gain by age 82 days, without weight loss after the age of 2 weeks. After a feeding, your baby may spit up a small amount. This is common. BREASTFEEDING FREQUENCY AND DURATION Frequent feeding will help you make more milk and can prevent sore nipples and breast engorgement. Breastfeed when you feel the need to reduce the fullness of your breasts or  when your baby shows signs of hunger. This is called "breastfeeding on demand." Avoid introducing a pacifier to your baby while you are working to establish breastfeeding (the first 4-6 weeks after your baby is born). After this time you may choose to use a pacifier. Research has shown that pacifier use during the first year of a baby's life decreases the risk of sudden infant death syndrome (SIDS). Allow your baby to feed on each breast as long as he or she wants. Breastfeed until your baby is finished feeding. When your baby unlatches or falls asleep while feeding from the first breast, offer the second breast. Because newborns are often sleepy in the first few weeks of life, you may need to awaken your baby to get him or her to feed. Breastfeeding times will vary from baby to baby. However, the following rules can serve as a guide to help you ensure that your baby is properly fed:  Newborns (babies 82 weeks of age or younger) may breastfeed every 1-3 hours.  Newborns should not go longer than 3 hours during the day or 5 hours during the night without breastfeeding.  You should breastfeed your baby a minimum of 8 times in a 24-hour period until you begin to introduce solid foods to your baby at around 56 months of age. BREAST MILK PUMPING Pumping and storing breast milk allows you to ensure that your baby is exclusively fed your breast milk, even at times when you are unable to breastfeed. This is especially important if you are going back to work while you are still breastfeeding or when you are not able to be present during feedings. Your lactation consultant can give you guidelines on how long it is safe to store breast milk. A breast pump is a machine that allows you to pump milk from your breast into a sterile bottle. The pumped breast milk can then be stored in a refrigerator or freezer. Some breast pumps are operated by hand, while others use electricity. Ask your lactation consultant which type  will work best for you. Breast pumps can be purchased, but some hospitals and breastfeeding support groups lease breast pumps on a monthly basis. A lactation consultant can teach you  how to hand express breast milk, if you prefer not to use a pump. CARING FOR YOUR BREASTS WHILE YOU BREASTFEED Nipples can become dry, cracked, and sore while breastfeeding. The following recommendations can help keep your breasts moisturized and healthy:  Avoid using soap on your nipples.  Wear a supportive bra. Although not required, special nursing bras and tank tops are designed to allow access to your breasts for breastfeeding without taking off your entire bra or top. Avoid wearing underwire-style bras or extremely tight bras.  Air dry your nipples for 3-75mnutes after each feeding.  Use only cotton bra pads to absorb leaked breast milk. Leaking of breast milk between feedings is normal.  Use lanolin on your nipples after breastfeeding. Lanolin helps to maintain your skin's normal moisture barrier. If you use pure lanolin, you do not need to wash it off before feeding your baby again. Pure lanolin is not toxic to your baby. You may also hand express a few drops of breast milk and gently massage that milk into your nipples and allow the milk to air dry. In the first few weeks after giving birth, some women experience extremely full breasts (engorgement). Engorgement can make your breasts feel heavy, warm, and tender to the touch. Engorgement peaks within 3-5 days after you give birth. The following recommendations can help ease engorgement:  Completely empty your breasts while breastfeeding or pumping. You may want to start by applying warm, moist heat (in the shower or with warm water-soaked hand towels) just before feeding or pumping. This increases circulation and helps the milk flow. If your baby does not completely empty your breasts while breastfeeding, pump any extra milk after he or she is finished.  Wear  a snug bra (nursing or regular) or tank top for 1-2 days to signal your body to slightly decrease milk production.  Apply ice packs to your breasts, unless this is too uncomfortable for you.  Make sure that your baby is latched on and positioned properly while breastfeeding. If engorgement persists after 48 hours of following these recommendations, contact your health care provider or a lScience writer OVERALL HEALTH CARE RECOMMENDATIONS WHILE BREASTFEEDING  Eat healthy foods. Alternate between meals and snacks, eating 3 of each per day. Because what you eat affects your breast milk, some of the foods may make your baby more irritable than usual. Avoid eating these foods if you are sure that they are negatively affecting your baby.  Drink milk, fruit juice, and water to satisfy your thirst (about 10 glasses a day).  Rest often, relax, and continue to take your prenatal vitamins to prevent fatigue, stress, and anemia.  Continue breast self-awareness checks.  Avoid chewing and smoking tobacco. Chemicals from cigarettes that pass into breast milk and exposure to secondhand smoke may harm your baby.  Avoid alcohol and drug use, including marijuana. Some medicines that may be harmful to your baby can pass through breast milk. It is important to ask your health care provider before taking any medicine, including all over-the-counter and prescription medicine as well as vitamin and herbal supplements. It is possible to become pregnant while breastfeeding. If birth control is desired, ask your health care provider about options that will be safe for your baby. SEEK MEDICAL CARE IF:  You feel like you want to stop breastfeeding or have become frustrated with breastfeeding.  You have painful breasts or nipples.  Your nipples are cracked or bleeding.  Your breasts are red, tender, or warm.  You have a  swollen area on either breast.  You have a fever or chills.  You have nausea or  vomiting.  You have drainage other than breast milk from your nipples.  Your breasts do not become full before feedings by the fifth day after you give birth.  You feel sad and depressed.  Your baby is too sleepy to eat well.  Your baby is having trouble sleeping.   Your baby is wetting less than 3 diapers in a 24-hour period.  Your baby has less than 3 stools in a 24-hour period.  Your baby's skin or the white part of his or her eyes becomes yellow.   Your baby is not gaining weight by 59 days of age. SEEK IMMEDIATE MEDICAL CARE IF:  Your baby is overly tired (lethargic) and does not want to wake up and feed.  Your baby develops an unexplained fever.   This information is not intended to replace advice given to you by your health care provider. Make sure you discuss any questions you have with your health care provider.   Document Released: 02/14/2005 Document Revised: 11/05/2014 Document Reviewed: 08/08/2012 Elsevier Interactive Patient Education 2016 [employer]. Iron-Rich Diet Iron is a mineral that helps your body to produce hemoglobin. Hemoglobin is a protein in your red blood cells that carries oxygen to your body's tissues. Eating too little iron may cause you to feel weak and tired, and it can increase your risk for infection. Eating enough iron is necessary for your body's metabolism, muscle function, and nervous system. Iron is naturally found in many foods. It can also be added to foods or fortified in foods. There are two types of dietary iron:  Heme iron. Heme iron is absorbed by the body more easily than nonheme iron. Heme iron is found in meat, poultry, and fish.  Nonheme iron. Nonheme iron is found in dietary supplements, iron-fortified grains, beans, and vegetables. You may need to follow an iron-rich diet if:  You have been diagnosed with iron deficiency or iron-deficiency anemia.  You have a condition that prevents you from absorbing dietary iron, such  as:  Infection in your intestines.  Celiac disease. This involves long-lasting (chronic) inflammation of your intestines.  You do not eat enough iron.  You eat a diet that is high in foods that impair iron absorption.  You have lost a lot of blood.  You have heavy bleeding during your menstrual cycle.  You are pregnant. WHAT IS MY PLAN? Your health care provider may help you to determine how much iron you need per day based on your condition. Generally, when a person consumes sufficient amounts of iron in the diet, the following iron needs are met:  Men.  27-44 years old: 11 mg per day.  70-51 years old: 8 mg per day.  Women.   74-34 years old: 15 mg per day.  66-38 years old: 18 mg per day.  Over 75 years old: 8 mg per day.  Pregnant women: 27 mg per day.  Breastfeeding women: 9 mg per day. WHAT DO I NEED TO KNOW ABOUT AN IRON-RICH DIET?  Eat fresh fruits and vegetables that are high in vitamin C along with foods that are high in iron. This will help increase the amount of iron that your body absorbs from food, especially with foods containing nonheme iron. Foods that are high in vitamin C include oranges, peppers, tomatoes, and mango.  Take iron supplements only as directed by your health care provider. Overdose of iron  can be life-threatening. If you were prescribed iron supplements, take them with orange juice or a vitamin C supplement.  Cook foods in pots and pans that are made from iron.   Eat nonheme iron-containing foods alongside foods that are high in heme iron. This helps to improve your iron absorption.   Certain foods and drinks contain compounds that impair iron absorption. Avoid eating these foods in the same meal as iron-rich foods or with iron supplements. These include:  Coffee, black tea, and red wine.  Milk, dairy products, and foods that are high in calcium.  Beans, soybeans, and peas.  Whole grains.  When eating foods that contain both  nonheme iron and compounds that impair iron absorption, follow these tips to absorb iron better.   Soak beans overnight before cooking.  Soak whole grains overnight and drain them before using.  Ferment flours before baking, such as using yeast in bread dough. WHAT FOODS CAN I EAT? Grains Iron-fortified breakfast cereal. Iron-fortified whole-wheat bread. Enriched rice. Sprouted grains. Vegetables Spinach. Potatoes with skin. Green peas. Broccoli. Red and green bell peppers. Fermented vegetables. Fruits Prunes. Raisins. Oranges. Strawberries. Mango. Grapefruit. Meats and Other Protein Sources Beef liver. Oysters. Beef. Shrimp. [place]. Chicken. [place]. Sardines. Chickpeas. Nuts. Tofu. Beverages Tomato juice. Fresh orange juice. Prune juice. Hibiscus tea. Fortified instant breakfast shakes. Condiments Tahini. Fermented soy sauce. Sweets and Desserts Black-strap molasses.  Other Wheat germ. The items listed above may not be a complete list of recommended foods or beverages. Contact your dietitian for more options. WHAT FOODS ARE NOT RECOMMENDED? Grains Whole grains. Bran cereal. Bran flour. Oats. Vegetables Artichokes. Brussels sprouts. Kale. Fruits Blueberries. Raspberries. Strawberries. Figs. Meats and Other Protein Sources Soybeans. Products made from soy protein. Dairy Milk. Cream. Cheese. Yogurt. Cottage cheese. Beverages Coffee. Black tea. Red wine. Sweets and Desserts Cocoa. Chocolate. Ice cream. Other Basil. Oregano. Parsley. The items listed above may not be a complete list of foods and beverages to avoid. Contact your dietitian for more information.   This information is not intended to replace advice given to you by your health care provider. Make sure you discuss any questions you have with your health care provider.   Document Released: 09/28/2004 Document Revised: 03/07/2014 Document Reviewed: 09/11/2013 Elsevier Interactive Patient Education  2016 [employer]. Anemia, Nonspecific Anemia is a condition in which the concentration of red blood cells or hemoglobin in the blood is below normal. Hemoglobin is a substance in red blood cells that carries oxygen to the tissues of the body. Anemia results in not enough oxygen reaching these tissues.  CAUSES  Common causes of anemia include:   Excessive bleeding. Bleeding may be internal or external. This includes excessive bleeding from periods (in women) or from the intestine.   Poor nutrition.   Chronic kidney, thyroid, and liver disease.  Bone marrow disorders that decrease red blood cell production.  Cancer and treatments for cancer.  HIV, AIDS, and their treatments.  Spleen problems that increase red blood cell destruction.  Blood disorders.  Excess destruction of red blood cells due to infection, medicines, and autoimmune disorders. SIGNS AND SYMPTOMS   Minor weakness.   Dizziness.   Headache.  Palpitations.   Shortness of breath, especially with exercise.   Paleness.  Cold sensitivity.  Indigestion.  Nausea.  Difficulty sleeping.  Difficulty concentrating. Symptoms may occur suddenly or they may develop slowly.  DIAGNOSIS  Additional blood tests are often needed. These help your health care provider determine the best treatment. Your health  care provider will check your stool for blood and look for other causes of blood loss.  TREATMENT  Treatment varies depending on the cause of the anemia. Treatment can include:   Supplements of iron, vitamin B12, or folic acid.   Hormone medicines.   A blood transfusion. This may be needed if blood loss is severe.   Hospitalization. This may be needed if there is significant continual blood loss.   Dietary changes.  Spleen removal. HOME CARE INSTRUCTIONS Keep all follow-up appointments. It often takes many weeks to correct anemia, and having your health care provider check on your condition and  your response to treatment is very important. SEEK IMMEDIATE MEDICAL CARE IF:   You develop extreme weakness, shortness of breath, or chest pain.   You become dizzy or have trouble concentrating.  You develop heavy vaginal bleeding.   You develop a rash.   You have bloody or black, tarry stools.   You faint.   You vomit up blood.   You vomit repeatedly.   You have abdominal pain.  You have a fever or persistent symptoms for more than 2-3 days.   You have a fever and your symptoms suddenly get worse.   You are dehydrated.  MAKE SURE YOU:  Understand these instructions.  Will watch your condition.  Will get help right away if you are not doing well or get worse.   This information is not intended to replace advice given to you by your health care provider. Make sure you discuss any questions you have with your health care provider.   Document Released: 03/24/2004 Document Revised: 10/17/2012 Document Reviewed: 08/10/2012 Elsevier Interactive Patient Education 2016 [employer]. Postpartum Depression and Baby Blues The postpartum period begins right after the birth of a baby. During this time, there is often a great amount of joy and excitement. It is also a time of many changes in the life of the parents. Regardless of how many times a mother gives birth, each child brings new challenges and dynamics to the family. It is not unusual to have feelings of excitement along with confusing shifts in moods, emotions, and thoughts. All mothers are at risk of developing postpartum depression or the "baby blues." These mood changes can occur right after giving birth, or they may occur many months after giving birth. The baby blues or postpartum depression can be mild or severe. Additionally, postpartum depression can go away rather quickly, or it can be a long-term condition.  CAUSES Raised hormone levels and the rapid drop in those levels are thought to be a main cause of  postpartum depression and the baby blues. A number of hormones change during and after pregnancy. Estrogen and progesterone usually decrease right after the delivery of your baby. The levels of thyroid hormone and various cortisol steroids also rapidly drop. Other factors that play a role in these mood changes include major life events and genetics.  RISK FACTORS If you have any of the following risks for the baby blues or postpartum depression, know what symptoms to watch out for during the postpartum period. Risk factors that may increase the likelihood of getting the baby blues or postpartum depression include:  Having a personal or family history of depression.   Having depression while being pregnant.   Having premenstrual mood issues or mood issues related to oral contraceptives.  Having a lot of life stress.   Having marital conflict.   Lacking a social support network.   Having a baby  with special needs.   Having health problems, such as diabetes.  SIGNS AND SYMPTOMS Symptoms of baby blues include:  Brief changes in mood, such as going from extreme happiness to sadness.  Decreased concentration.   Difficulty sleeping.   Crying spells, tearfulness.   Irritability.   Anxiety.  Symptoms of postpartum depression typically begin within the first month after giving birth. These symptoms include:  Difficulty sleeping or excessive sleepiness.   Marked weight loss.   Agitation.   Feelings of worthlessness.   Lack of interest in activity or food.  Postpartum psychosis is a very serious condition and can be dangerous. Fortunately, it is rare. Displaying any of the following symptoms is cause for immediate medical attention. Symptoms of postpartum psychosis include:   Hallucinations and delusions.   Bizarre or disorganized behavior.   Confusion or disorientation.  DIAGNOSIS  A diagnosis is made by an evaluation of your symptoms. There are no medical  or lab tests that lead to a diagnosis, but there are various questionnaires that a health care provider may use to identify those with the baby blues, postpartum depression, or psychosis. Often, a screening tool called the [place] Postnatal Depression Scale is used to diagnose depression in the postpartum period.  TREATMENT The baby blues usually goes away on its own in 1-2 weeks. Social support is often all that is needed. You will be encouraged to get adequate sleep and rest. Occasionally, you may be given medicines to help you sleep.  Postpartum depression requires treatment because it can last several months or longer if it is not treated. Treatment may include individual or group therapy, medicine, or both to address any social, physiological, and psychological factors that may play a role in the depression. Regular exercise, a healthy diet, rest, and social support may also be strongly recommended.  Postpartum psychosis is more serious and needs treatment right away. Hospitalization is often needed. HOME CARE INSTRUCTIONS  Get as much rest as you can. Nap when the baby sleeps.   Exercise regularly. Some women find yoga and walking to be beneficial.   Eat a balanced and nourishing diet.   Do little things that you enjoy. Have a cup of tea, take a bubble bath, read your favorite magazine, or listen to your favorite music.  Avoid alcohol.   Ask for help with household chores, cooking, grocery shopping, or running errands as needed. Do not try to do everything.   Talk to people close to you about how you are feeling. Get support from your partner, family members, friends, or other new moms.  Try to stay positive in how you think. Think about the things you are grateful for.   Do not spend a lot of time alone.   Only take over-the-counter or prescription medicine as directed by your health care provider.  Keep all your postpartum appointments.   Let your health care provider  know if you have any concerns.  SEEK MEDICAL CARE IF: You are having a reaction to or problems with your medicine. SEEK IMMEDIATE MEDICAL CARE IF:  You have suicidal feelings.   You think you may harm the baby or someone else. MAKE SURE YOU:  Understand these instructions.  Will watch your condition.  Will get help right away if you are not doing well or get worse.   This information is not intended to replace advice given to you by your health care provider. Make sure you discuss any questions you have with your health care provider.  Document Released: 11/19/2003 Document Revised: 02/19/2013 Document Reviewed: 11/26/2012 Elsevier Interactive Patient Education 2016 [place]. Postpartum Care After Cesarean Delivery After you deliver your newborn (postpartum period), the usual stay in the hospital is 24-72 hours. If there were problems with your labor or delivery, or if you have other medical problems, you might be in the hospital longer.  While you are in the hospital, you will receive help and instructions on how to care for yourself and your newborn during the postpartum period.  While you are in the hospital:  It is normal for you to have pain or discomfort from the incision in your abdomen. Be sure to tell your nurses when you are having pain, where the pain is located, and what makes the pain worse.  If you are breastfeeding, you may feel uncomfortable contractions of your uterus for a couple of weeks. This is normal. The contractions help your uterus get back to normal size.  It is normal to have some bleeding after delivery.  For the first 1-3 days after delivery, the flow is red and the amount may be similar to a period.  It is common for the flow to start and stop.  In the first few days, you may pass some small clots. Let your nurses know if you begin to pass large clots or your flow increases.  Do not  flush blood clots down the toilet before having the nurse  look at them.  During the next 3-10 days after delivery, your flow should become more watery and pink or brown-tinged in color.  Ten to fourteen days after delivery, your flow should be a small amount of yellowish-white discharge.  The amount of your flow will decrease over the first few weeks after delivery. Your flow may stop in 6-8 weeks. Most women have had their flow stop by 12 weeks after delivery.  You should change your sanitary pads frequently.  Wash your hands thoroughly with soap and water for at least 20 seconds after changing pads, using the toilet, or before holding or feeding your newborn.  Your intravenous (IV) tubing will be removed when you are drinking enough fluids.  The urine drainage tube (urinary catheter) that was inserted before delivery may be removed within 6-8 hours after delivery or when feeling returns to your legs. You should feel like you need to empty your bladder within the first 6-8 hours after the catheter has been removed.  In case you become weak, lightheaded, or faint, call your nurse before you get out of bed for the first time and before you take a shower for the first time.  Within the first few days after delivery, your breasts may begin to feel tender and full. This is called engorgement. Breast tenderness usually goes away within 48-72 hours after engorgement occurs. You may also notice milk leaking from your breasts. If you are not breastfeeding, do not stimulate your breasts. Breast stimulation can make your breasts produce more milk.  Spending as much time as possible with your newborn is very important. During this time, you and your newborn can feel close and get to know each other. Having your newborn stay in your room (rooming in) will help to strengthen the bond with your newborn. It will give you time to get to know your newborn and become comfortable caring for your newborn.  Your hormones change after delivery. Sometimes the hormone  changes can temporarily cause you to feel sad or tearful. These feelings should not last  more than a few days. If these feelings last longer than that, you should talk to your caregiver.  If desired, talk to your caregiver about methods of family planning or contraception.  Talk to your caregiver about immunizations. Your caregiver may want you to have the following immunizations before leaving the hospital:  Tetanus, diphtheria, and pertussis (Tdap) or tetanus and diphtheria (Td) immunization. It is very important that you and your family (including grandparents) or others caring for your newborn are up-to-date with the Tdap or Td immunizations. The Tdap or Td immunization can help protect your newborn from getting ill.  Rubella immunization.  Varicella (chickenpox) immunization.  Influenza immunization. You should receive this annual immunization if you did not receive the immunization during your pregnancy.   This information is not intended to replace advice given to you by your health care provider. Make sure you discuss any questions you have with your health care provider.   Document Released: 11/09/2011 Document Reviewed: 11/09/2011 Elsevier Interactive Patient Education [employer].

## 2015-11-27 NOTE — Discharge Summary (Signed)
[facility] Discharge Summary   Patient Name:   Brittney Sandoval DOB:     Jan 13, 1981 MRN:     [id number]  Date of Admission:   11/25/2015 Date of Discharge:  11/27/2015  Admitting diagnosis:    VERSION Principal Problem:   S/P repeat low transverse C-section  Term Pregnancy Delivered and [facility]    Discharge diagnosis:    VERSION Principal Problem:   S/P repeat low transverse C-section  Term Pregnancy Delivered and PPH                                                                     Post partum procedures: blood transfusion  Type of Delivery:  Repeat C/S  Delivering Provider: Everett Graff   Date of Delivery:  11/25/15  Newborn Data:    Live born female  Birth Weight: 7 lb 11.5 oz (3500 g) APGAR: 8, 9  Baby's Name:   Baby Feeding:   Breast Disposition:   home with mother  Complications:   [id number]  Hospital course:      Induction of Labor With Cesarean Section  35 y.o. yo [id number] at [redacted]w[redacted]d was admitted to the hospital 11/25/2015 for induction of labor. Patient had a labor course significant for Version; Induction of labor with unstable lie; Bleeding in labor; PPH. The patient went for cesarean section due to Malpresentation and vaginal bleeding, and delivered a Viable female infant, Membrane Rupture Time/Date: )7:07 PM ,11/25/2015   @Details  of operation can be found in separate operative Note.  Patient had an uncomplicated postpartum course. She is ambulating, tolerating a regular diet, passing flatus, and urinating well.  Patient is discharged home in stable condition on 11/27/15.                                     Physical Exam:  Vitals:   11/26/15 1145 11/26/15 1600 11/26/15 1915 11/27/15 0559  BP:  122/69 139/63 127/86  Pulse:  96 99 97  Resp: 20 20 18 18   Temp: 98.1 F (36.7 C) 98.6 F (37 C) 98.4 F (36.9 C) 98.5 F (36.9 C)  TempSrc: Oral Oral Oral Oral  SpO2: 100% 99% 99%   Weight:      Height:       General: alert,  cooperative and no distress Lochia: appropriate Uterine Fundus: firm Incision: Healing well with no significant drainage DVT Evaluation: No evidence of DVT seen on physical exam.  Labs:  CBC Latest Ref Rng & Units 11/26/2015 11/25/2015 11/25/2015  WBC 4.0 - 10.5 K/uL 15.5(H) 16.5(H) 6.0  Hemoglobin 12.0 - 15.0 g/dL 8.7(L) 10.2(L) 9.7(L)  Hematocrit 36.0 - 46.0 % 25.4(L) 30.4(L) 29.7(L)  Platelets 150 - 400 K/uL 194 209 246     CMP Latest Ref Rng & Units 11/25/2014 09/30/2014 08/11/2012  Glucose 65 - 99 mg/dL 100(H) 96 92  BUN 6 - 20 mg/dL 16 12 12   Creatinine 0.44 - 1.00 mg/dL 0.78 0.80 0.88  Sodium 135 - 145 mmol/L 134(L) 138 136  Potassium 3.5 - 5.1 mmol/L 3.7 4.0 3.2(L)  Chloride 101 - 111 mmol/L 103 108 102  CO2 22 - 32 mmol/L 28 25 26   Calcium  8.9 - 10.3 mg/dL 9.5 9.6 10.8(H)  Total Protein 6.5 - 8.1 g/dL 7.9 8.5(H) 8.1  Total Bilirubin 0.3 - 1.2 mg/dL 0.2(L) 0.4 0.3  Alkaline Phos 38 - 126 U/L 58 59 60  AST 15 - 41 U/L 21 16 18   ALT 14 - 54 U/L 20 16 15     Discharge instruction: per After Visit Summary and "Baby and Me Booklet".  After Visit Meds:    Medication List    STOP taking these medications   ACIDOPHILUS PROBIOTIC 10 MG Tabs   aspirin EC 81 MG tablet   dicyclomine 20 MG tablet Commonly known as:  BENTYL   ondansetron 4 MG disintegrating tablet Commonly known as:  ZOFRAN ODT     TAKE these medications   desonide 0.05 % lotion Commonly known as:  DESOWEN Apply 1 application topically 2 (two) times daily.   hydrochlorothiazide 25 MG tablet Commonly known as:  HYDRODIURIL TAKE 1 TABLET BY MOUTH DAILY   INTEGRA F 125-1 MG Caps Take 1 tablet by mouth daily.   oxyCODONE 5 MG immediate release tablet Commonly known as:  Oxy IR/ROXICODONE Take 1 tablet (5 mg total) by mouth every 4 (four) hours as needed (pain scale 4-7).   prenatal multivitamin Tabs tablet Take 1 tablet by mouth daily at 12 noon.       Diet: routine diet  Activity: Advance as  tolerated. Pelvic rest for 6 weeks.   Outpatient follow up:1 week for incision check Follow up Appt:No future appointments. Follow up visit: No Follow-up on file.  Postpartum contraception: IUD Mirena  11/27/2015 Larey Days, CNM

## 2015-11-28 LAB — CBC
HCT: 18.8 % — ABNORMAL LOW (ref 36.0–46.0)
Hemoglobin: 6.3 g/dL — CL (ref 12.0–15.0)
MCH: 24 pg — ABNORMAL LOW (ref 26.0–34.0)
MCHC: 33.5 g/dL (ref 30.0–36.0)
MCV: 71.5 fL — ABNORMAL LOW (ref 78.0–100.0)
Platelets: 224 10*3/uL (ref 150–400)
RBC: 2.63 MIL/uL — ABNORMAL LOW (ref 3.87–5.11)
RDW: 18.9 % — AB (ref 11.5–15.5)
WBC: 12.3 10*3/uL — ABNORMAL HIGH (ref 4.0–10.5)

## 2015-11-28 LAB — PREPARE RBC (CROSSMATCH)

## 2015-11-28 MED ORDER — HYDROCHLOROTHIAZIDE 25 MG PO TABS
25.0000 mg | ORAL_TABLET | Freq: Every day | ORAL | Status: DC
  Administered 2015-11-28 – 2015-11-29 (×2): 25 mg via ORAL
  Filled 2015-11-28 (×2): qty 1

## 2015-11-28 MED ORDER — FUROSEMIDE 10 MG/ML IJ SOLN
20.0000 mg | Freq: Once | INTRAMUSCULAR | Status: AC
  Administered 2015-11-28: 20 mg via INTRAVENOUS
  Filled 2015-11-28: qty 2

## 2015-11-28 MED ORDER — SODIUM CHLORIDE 0.9 % IV SOLN
Freq: Once | INTRAVENOUS | Status: AC
  Administered 2015-11-28: 19:00:00 via INTRAVENOUS

## 2015-11-28 MED ORDER — DIPHENHYDRAMINE HCL 25 MG PO CAPS
25.0000 mg | ORAL_CAPSULE | Freq: Once | ORAL | Status: AC
  Administered 2015-11-28: 25 mg via ORAL
  Filled 2015-11-28: qty 1

## 2015-11-28 MED ORDER — ACETAMINOPHEN 325 MG PO TABS
650.0000 mg | ORAL_TABLET | Freq: Once | ORAL | Status: AC
  Administered 2015-11-28: 650 mg via ORAL
  Filled 2015-11-28: qty 2

## 2015-11-28 MED ORDER — SODIUM CHLORIDE 0.9 % IV SOLN
Freq: Once | INTRAVENOUS | Status: AC
  Administered 2015-11-28: 10 mL/h via INTRAVENOUS

## 2015-11-28 NOTE — Lactation Note (Signed)
This note was copied from a baby's chart. Lactation Consultation Note  Patient Name: Brittney Sandoval [id number] Date: 11/28/2015 Reason for consult: Follow-up assessment  With this mom of a term baby, now 107 hours old. Mom reports breastfeeding going well, hearing "gulps". Mom encouraged to use lactation services, eg. Bf support group and calling lactation as needed.    Maternal Data    Feeding Feeding Type: Breast Fed Length of feed: 25 min  LATCH Score/Interventions                      Lactation Tools Discussed/Used     Consult Status Consult Status: Complete Follow-up type: Call as needed    Tonna Corner 11/28/2015, 8:15 AM

## 2015-11-28 NOTE — Progress Notes (Signed)
Dr. Alesia Richards informed about patient's desire to be seen by her obstetrical care provider prior to being discharged home today; specifically to discuss her concerns she has about her "blood count", her blood pressure, and her history of constipation after her first Cesarean Section. Dr. Alesia Richards ordered a CBC and stated that she would be making rounds to see the patient after the CBC was resulted.

## 2015-11-28 NOTE — Progress Notes (Signed)
Patient states that while she was up ambulating and pushing baby girl in hallway that she became lightheaded. Stated that she did not fall or need assistance. Went back to bed and called this nurse to notify me of this. Vitals WNL, color is pale- pink.

## 2015-11-28 NOTE — Progress Notes (Signed)
Brittney, Sandoval Female, 35 y.o., 15-Feb-1981  Subjective: Postpartum Day 3. Cesarean Delivery Patient reports lightheadedness and dizziness as well as headache earlier on in the day after ambulating.  Tolerating a regular diet, passing flatus but no bowel movement.   Objective: Vital signs in last 24 hours: Temp:  [98.2 F (36.8 C)-99.3 F (37.4 C)] 98.3 F (36.8 C) (09/30 1744) Pulse Rate:  [95-110] 104 (09/30 1744) Resp:  [18-20] 20 (09/30 1744) BP: (125-158)/(68-92) 158/92 (09/30 1744) SpO2:  [100 %] 100 % (09/30 1744)  Physical Exam:  General: alert, cooperative and no distress Lochia: appropriate Uterine Fundus: 1FB above umbulicus Incision: no significant drainage DVT Evaluation: No evidence of DVT seen on physical exam. Calf/Ankle edema is present.2+ on legs.    Recent Labs  11/26/15 0528 11/28/15 1547  HGB 8.7* 6.3*  HCT 25.4* 18.8*    Assessment/Plan: Status post Cesarean section. Postop day 3 with symptomatic anemia  -Cancel discharge for today. -Administer 2 units of packed red blood cells with a 4 hour post transfusion hemoglobin and hematocrit check. -Plan for discharge tomorrow after blood transfusion if improved.  -Miralax and stool softeners prn.  Alinda Dooms, MD.  11/28/2015, 6:24 PM

## 2015-11-28 NOTE — Progress Notes (Signed)
CRITICAL VALUE ALERT  Critical value received:  Hgb 6.3 Date of notification:  11/28/2015  Time of notification:  1610 Critical value read back:Yes  Nurse who received alert:  Ernst Breach, RN  MD notified (1st page):  Alesia Richards  Time of first page:  1610  MD notified (2nd page):  Time of second page:  Responding MD: Alesia Richards  Time MD responded: [id number]

## 2015-11-29 LAB — TYPE AND SCREEN
ABO/RH(D): A POS
ANTIBODY SCREEN: NEGATIVE
UNIT DIVISION: 0
UNIT DIVISION: 0
Unit division: 0
Unit division: 0
Unit division: 0
Unit division: 0

## 2015-11-29 LAB — CBC
HCT: 25.8 % — ABNORMAL LOW (ref 36.0–46.0)
Hemoglobin: 8.6 g/dL — ABNORMAL LOW (ref 12.0–15.0)
MCH: 24.7 pg — AB (ref 26.0–34.0)
MCHC: 33.7 g/dL (ref 30.0–36.0)
MCV: 73.3 fL — AB (ref 78.0–100.0)
PLATELETS: 232 10*3/uL (ref 150–400)
RBC: 3.52 MIL/uL — AB (ref 3.87–5.11)
RDW: 19 % — ABNORMAL HIGH (ref 11.5–15.5)
WBC: 10 10*3/uL (ref 4.0–10.5)

## 2015-11-29 MED ORDER — SODIUM CHLORIDE 0.9 % IV SOLN: INTRAVENOUS | Status: DC

## 2015-11-29 MED ORDER — BISACODYL 10 MG RE SUPP
10.0000 mg | Freq: Once | RECTAL | Status: AC
  Administered 2015-11-29: 10 mg via RECTAL
  Filled 2015-11-29: qty 1

## 2015-11-29 MED ORDER — OXYCODONE HCL 5 MG PO TABS: 5.0000 mg | ORAL_TABLET | ORAL | 0 refills | Status: DC | PRN

## 2015-11-29 MED ORDER — IBUPROFEN 600 MG PO TABS: 600.0000 mg | ORAL_TABLET | Freq: Four times a day (QID) | ORAL | 0 refills | Status: DC

## 2015-11-29 NOTE — Progress Notes (Signed)
Infusion of second PRBC infused with no adverse effects. Patient states that she feels more energy and headache resolved after pain medication given. Resting comfortably in bed without complaint. Condition stable.

## 2015-11-29 NOTE — Discharge Summary (Signed)
Obstetric Discharge Summary Reason for Admission: onset of labor Prenatal Procedures: NST and ultrasound Intrapartum Procedures: cesarean: low cervical, transverse Postpartum Procedures: transfusion x 4 units Complications-Operative and Postpartum: hemorrhage Hemoglobin  Date Value Ref Range Status  11/28/2015 6.3 (LL) 12.0 - 15.0 g/dL Final    Comment:    CRITICAL RESULT CALLED TO, READ BACK BY AND VERIFIED WITH: DONNA ESKER 1605 11/28/15 BY A POTEAT    HCT  Date Value Ref Range Status  11/28/2015 18.8 (L) 36.0 - 46.0 % Final    Physical Exam:  General: alert, cooperative and no distress Lochia: appropriate Uterine Fundus: firm Incision: healing well, no significant drainage DVT Evaluation: No evidence of DVT seen on physical exam.  Discharge Diagnoses: Term Pregnancy-delivered by Repeat LTCS PPH  Discharge Information: Date: 11/29/2015 Activity: unrestricted Diet: routine Medications: Ibuprofen and Percocet Condition: stable Instructions: refer to practice specific booklet Discharge to: home [facility]. Schedule an appointment as soon as possible for a visit in 6 week(s).   Specialty:  Obstetrics and Gynecology Contact information: [address] [place] [phone]          Newborn Data: Live born female  Birth Weight: 7 lb 11.5 oz (3500 g) APGAR: 8, 9  Home with mother.  Pleas Koch Prothero 11/29/2015, 8:51 AM

## 2015-11-29 NOTE — Progress Notes (Signed)
Infusion close to completion. Patient tolerated well with no adverse side effects other than a mild frontal headache.

## 2015-12-02 LAB — TYPE AND SCREEN
ABO/RH(D): A POS
ANTIBODY SCREEN: NEGATIVE
UNIT DIVISION: 0
UNIT DIVISION: 0
Unit division: 0
Unit division: 0

## 2015-12-14 ENCOUNTER — Inpatient Hospital Stay (HOSPITAL_COMMUNITY)
Admission: EM | Admit: 2015-12-14 | Discharge: 2015-12-16 | DRG: 776 | Disposition: A | Discharge status: Home / Self Care | Payer: Medicaid Other | Attending: Internal Medicine | Admitting: Internal Medicine

## 2015-12-14 ENCOUNTER — Encounter (HOSPITAL_COMMUNITY): Payer: Self-pay | Admitting: Emergency Medicine

## 2015-12-14 ENCOUNTER — Emergency Department (HOSPITAL_COMMUNITY): Payer: Medicaid Other

## 2015-12-14 ENCOUNTER — Other Ambulatory Visit: Payer: Self-pay

## 2015-12-14 ENCOUNTER — Ambulatory Visit (HOSPITAL_COMMUNITY)
Admission: EM | Admit: 2015-12-14 | Discharge: 2015-12-14 | Disposition: A | Discharge status: Nursing Home (Includes ICF) | Payer: Medicaid Other | Source: Home / Self Care

## 2015-12-14 DIAGNOSIS — I2699 Other pulmonary embolism without acute cor pulmonale: Secondary | ICD-10-CM | POA: Diagnosis not present

## 2015-12-14 DIAGNOSIS — R51 Headache: Secondary | ICD-10-CM | POA: Diagnosis present

## 2015-12-14 DIAGNOSIS — Z841 Family history of disorders of kidney and ureter: Secondary | ICD-10-CM

## 2015-12-14 DIAGNOSIS — R079 Chest pain, unspecified: Secondary | ICD-10-CM | POA: Diagnosis not present

## 2015-12-14 DIAGNOSIS — I10 Essential (primary) hypertension: Secondary | ICD-10-CM | POA: Diagnosis present

## 2015-12-14 DIAGNOSIS — Z98891 History of uterine scar from previous surgery: Secondary | ICD-10-CM

## 2015-12-14 DIAGNOSIS — Z8249 Family history of ischemic heart disease and other diseases of the circulatory system: Secondary | ICD-10-CM

## 2015-12-14 DIAGNOSIS — Z79899 Other long term (current) drug therapy: Secondary | ICD-10-CM

## 2015-12-14 DIAGNOSIS — O8823 Thromboembolism in the puerperium: Principal | ICD-10-CM | POA: Diagnosis present

## 2015-12-14 DIAGNOSIS — E669 Obesity, unspecified: Secondary | ICD-10-CM | POA: Diagnosis present

## 2015-12-14 DIAGNOSIS — Z6835 Body mass index (BMI) 35.0-35.9, adult: Secondary | ICD-10-CM

## 2015-12-14 DIAGNOSIS — Z833 Family history of diabetes mellitus: Secondary | ICD-10-CM

## 2015-12-14 LAB — CBC WITH DIFFERENTIAL/PLATELET
Basophils Absolute: 0 10*3/uL (ref 0.0–0.1)
Basophils Relative: 1 %
EOS ABS: 0.4 10*3/uL (ref 0.0–0.7)
Eosinophils Relative: 5 %
HEMATOCRIT: 34.5 % — AB (ref 36.0–46.0)
HEMOGLOBIN: 11.1 g/dL — AB (ref 12.0–15.0)
LYMPHS ABS: 2.2 10*3/uL (ref 0.7–4.0)
LYMPHS PCT: 28 %
MCH: 23.6 pg — AB (ref 26.0–34.0)
MCHC: 32.2 g/dL (ref 30.0–36.0)
MCV: 73.2 fL — AB (ref 78.0–100.0)
MONOS PCT: 5 %
Monocytes Absolute: 0.4 10*3/uL (ref 0.1–1.0)
NEUTROS PCT: 61 %
Neutro Abs: 4.9 10*3/uL (ref 1.7–7.7)
Platelets: 368 10*3/uL (ref 150–400)
RBC: 4.71 MIL/uL (ref 3.87–5.11)
RDW: 18.8 % — ABNORMAL HIGH (ref 11.5–15.5)
WBC: 8 10*3/uL (ref 4.0–10.5)

## 2015-12-14 LAB — COMPREHENSIVE METABOLIC PANEL
ALK PHOS: 75 U/L (ref 38–126)
ALT: 24 U/L (ref 14–54)
ANION GAP: 6 (ref 5–15)
AST: 23 U/L (ref 15–41)
Albumin: 3.4 g/dL — ABNORMAL LOW (ref 3.5–5.0)
BILIRUBIN TOTAL: 0.4 mg/dL (ref 0.3–1.2)
BUN: 8 mg/dL (ref 6–20)
CALCIUM: 9.6 mg/dL (ref 8.9–10.3)
CO2: 26 mmol/L (ref 22–32)
CREATININE: 0.88 mg/dL (ref 0.44–1.00)
Chloride: 108 mmol/L (ref 101–111)
Glucose, Bld: 87 mg/dL (ref 65–99)
Potassium: 4.4 mmol/L (ref 3.5–5.1)
SODIUM: 140 mmol/L (ref 135–145)
TOTAL PROTEIN: 7.2 g/dL (ref 6.5–8.1)

## 2015-12-14 LAB — D-DIMER, QUANTITATIVE (NOT AT ARMC): D DIMER QUANT: 1.91 ug{FEU}/mL — AB (ref 0.00–0.50)

## 2015-12-14 LAB — I-STAT TROPONIN, ED: TROPONIN I, POC: 0 ng/mL (ref 0.00–0.08)

## 2015-12-14 MED ORDER — IBUPROFEN 600 MG PO TABS
600.0000 mg | ORAL_TABLET | Freq: Four times a day (QID) | ORAL | Status: DC
  Administered 2015-12-15: 600 mg via ORAL
  Filled 2015-12-14: qty 1

## 2015-12-14 MED ORDER — HEPARIN (PORCINE) IN NACL 100-0.45 UNIT/ML-% IJ SOLN: 12.0000 [IU]/kg/h | INTRAMUSCULAR | Status: DC

## 2015-12-14 MED ORDER — PRENATAL PLUS 27-1 MG PO TABS
1.0000 | ORAL_TABLET | Freq: Every day | ORAL | Status: DC
  Administered 2015-12-15: 1 via ORAL
  Filled 2015-12-14 (×2): qty 1

## 2015-12-14 MED ORDER — IOPAMIDOL (ISOVUE-370) INJECTION 76%: 100.0000 mL | Freq: Once | INTRAVENOUS | Status: DC | PRN

## 2015-12-14 MED ORDER — ACETAMINOPHEN 500 MG PO TABS
1000.0000 mg | ORAL_TABLET | Freq: Once | ORAL | Status: AC
  Administered 2015-12-14: 1000 mg via ORAL
  Filled 2015-12-14: qty 2

## 2015-12-14 MED ORDER — ENOXAPARIN SODIUM 100 MG/ML ~~LOC~~ SOLN
1.0000 mg/kg | Freq: Two times a day (BID) | SUBCUTANEOUS | Status: DC
  Filled 2015-12-14: qty 1

## 2015-12-14 MED ORDER — OXYCODONE HCL 5 MG PO TABS
5.0000 mg | ORAL_TABLET | ORAL | Status: DC | PRN
  Administered 2015-12-15 (×2): 5 mg via ORAL
  Filled 2015-12-14 (×2): qty 1

## 2015-12-14 MED ORDER — HEPARIN SODIUM (PORCINE) 5000 UNIT/ML IJ SOLN: 4000.0000 [IU] | Freq: Once | INTRAMUSCULAR | Status: DC

## 2015-12-14 MED ORDER — FERROUS SULFATE 325 (65 FE) MG PO TABS
325.0000 mg | ORAL_TABLET | Freq: Two times a day (BID) | ORAL | Status: DC
  Administered 2015-12-15 – 2015-12-16 (×3): 325 mg via ORAL
  Filled 2015-12-14 (×3): qty 1

## 2015-12-14 NOTE — H&P (Addendum)
History and Physical    Brittney Sandoval [id number] DOB: 1980-10-16 DOA: 12/14/2015   PCP: Ron Parker, MD Chief Complaint:  Chief Complaint  Patient presents with  . Chest Pain    HPI: Brittney Sandoval is a 35 y.o. female with medical history significant of C.Section on 9/27, currently breast feeding.  Patient presents to the ED by way of UC for c/o left sided chest pain.  Pain increases with deep breathing.  Occurs in context of recent C.Section.  Symptoms onset earlier this AM.  Symptoms are constant and persistent.  ED Course: Does have PE on CTA.  Review of Systems: As per HPI otherwise 10 point review of systems negative.    Past Medical History:  Diagnosis Date  . Anemia   . Fibroid   . Hypertension     Past Surgical History:  Procedure Laterality Date  . CESAREAN SECTION N/A 04/04/2013   Procedure: CESAREAN SECTION;  Surgeon: Emily Filbert, MD;  Location: [facility];  Service: Obstetrics;  Laterality: N/A;  . CESAREAN SECTION N/A 11/25/2015   Procedure: CESAREAN SECTION;  Surgeon: Everett Graff, MD;  Location: [facility];  Service: Obstetrics;  Laterality: N/A;  . LAPAROSCOPIC CHOLECYSTECTOMY  2010   [place], [place]  . TONSILLECTOMY       reports that she has never smoked. She has never used smokeless tobacco. She reports that she drinks alcohol. She reports that she does not use drugs.  No Known Allergies  Family History  Problem Relation Age of Onset  . Diabetes Mother   . Hypertension Mother   . Kidney disease Mother   . Heart disease Mother       Prior to Admission medications   Medication Sig Start Date End Date Taking? Authorizing Provider  desonide (DESOWEN) 0.05 % lotion Apply 1 application topically 2 (two) times daily.   Yes Historical Provider, MD  ferrous sulfate 325 (65 FE) MG tablet Take 1 tablet by mouth 2 (two) times daily. 10/28/15  Yes Historical Provider, MD  ibuprofen (ADVIL,MOTRIN) 600 MG tablet Take 1 tablet  (600 mg total) by mouth every 6 (six) hours. 11/29/15  Yes Naima Dillard, MD  oxyCODONE (OXY IR/ROXICODONE) 5 MG immediate release tablet Take 1 tablet (5 mg total) by mouth every 4 (four) hours as needed (pain scale 4-7). 11/29/15  Yes Crawford Givens, MD  Prenatal Vit-Fe Fumarate-FA (PRENATAL MULTIVITAMIN) TABS tablet Take 1 tablet by mouth daily at 12 noon.   Yes Historical Provider, MD    Physical Exam: Vitals:   12/14/15 1830 12/14/15 1842 12/14/15 1845 12/14/15 2100  BP: (!) 140/101 138/81  134/89  Pulse: 78  85 70  Resp: 17  19 21   Temp: 98.7 F (37.1 C)     TempSrc: Oral     SpO2: 97%  100% 97%  Weight:      Height:          Constitutional: NAD, calm, comfortable Eyes: PERRL, lids and conjunctivae normal ENMT: Mucous membranes are moist. Posterior pharynx clear of any exudate or lesions.Normal dentition.  Neck: normal, supple, no masses, no thyromegaly Respiratory: clear to auscultation bilaterally, no wheezing, no crackles. Normal respiratory effort. No accessory muscle use.  Cardiovascular: Regular rate and rhythm, no murmurs / rubs / gallops. No extremity edema. 2+ pedal pulses. No carotid bruits.  Abdomen: no tenderness, no masses palpated. No hepatosplenomegaly. Bowel sounds positive.  Musculoskeletal: no clubbing / cyanosis. No joint deformity upper and lower extremities. Good ROM, no contractures. Normal  muscle tone.  Skin: no rashes, lesions, ulcers. No induration Neurologic: CN 2-12 grossly intact. Sensation intact, DTR normal. Strength 5/5 in all 4.  Psychiatric: Normal judgment and insight. Alert and oriented x 3. Normal mood.    Labs on Admission: I have personally reviewed following labs and imaging studies  CBC:  Recent Labs Lab 12/14/15 1643  WBC 8.0  NEUTROABS 4.9  HGB 11.1*  HCT 34.5*  MCV 73.2*  PLT 123XX123   Basic Metabolic Panel:  Recent Labs Lab 12/14/15 1643  NA 140  K 4.4  CL 108  CO2 26  GLUCOSE 87  BUN 8  CREATININE 0.88  CALCIUM  9.6   GFR: Estimated Creatinine Clearance: 99.8 mL/min (by C-G formula based on SCr of 0.88 mg/dL). Liver Function Tests:  Recent Labs Lab 12/14/15 1643  AST 23  ALT 24  ALKPHOS 75  BILITOT 0.4  PROT 7.2  ALBUMIN 3.4*   No results for input(s): LIPASE, AMYLASE in the last 168 hours. No results for input(s): AMMONIA in the last 168 hours. Coagulation Profile: No results for input(s): INR, PROTIME in the last 168 hours. Cardiac Enzymes: No results for input(s): CKTOTAL, CKMB, CKMBINDEX, TROPONINI in the last 168 hours. BNP (last 3 results) No results for input(s): PROBNP in the last 8760 hours. HbA1C: No results for input(s): HGBA1C in the last 72 hours. CBG: No results for input(s): GLUCAP in the last 168 hours. Lipid Profile: No results for input(s): CHOL, HDL, LDLCALC, TRIG, CHOLHDL, LDLDIRECT in the last 72 hours. Thyroid Function Tests: No results for input(s): TSH, T4TOTAL, FREET4, T3FREE, THYROIDAB in the last 72 hours. Anemia Panel: No results for input(s): VITAMINB12, FOLATE, FERRITIN, TIBC, IRON, RETICCTPCT in the last 72 hours. Urine analysis:    Component Value Date/Time   COLORURINE YELLOW 11/25/2014 1726   APPEARANCEUR CLOUDY (A) 11/25/2014 1726   LABSPEC 1.019 11/25/2014 1726   PHURINE 5.0 11/25/2014 1726   GLUCOSEU NEGATIVE 11/25/2014 1726   HGBUR MODERATE (A) 11/25/2014 1726   BILIRUBINUR NEGATIVE 11/25/2014 1726   KETONESUR 15 (A) 11/25/2014 1726   PROTEINUR NEGATIVE 11/25/2014 1726   UROBILINOGEN 1.0 11/25/2014 1726   NITRITE NEGATIVE 11/25/2014 1726   LEUKOCYTESUR SMALL (A) 11/25/2014 1726   Sepsis Labs: @LABRCNTIP (procalcitonin:4,lacticidven:4) )No results found for this or any previous visit (from the past 240 hour(s)).   Radiological Exams on Admission: Ct Angio Chest Pe W And/or Wo Contrast  Result Date: 12/14/2015 CLINICAL DATA:  35 year old female with left-sided chest pain and shortness of breath. Recent C-section. EXAM: CT  ANGIOGRAPHY CHEST WITH CONTRAST TECHNIQUE: Multidetector CT imaging of the chest was performed using the standard protocol during bolus administration of intravenous contrast. Multiplanar CT image reconstructions and MIPs were obtained to evaluate the vascular anatomy. CONTRAST:  100 cc Isovue 370 and additional 80 cc of Isovue 370 given for repeat scan. COMPARISON:  None. FINDINGS: Cardiovascular: The thoracic aorta appears unremarkable. The origins of the great vessels of the aortic arch appear patent. Evaluation of the pulmonary arteries is somewhat limited due to streak artifact and suboptimal visualization of peripheral branches. There are intraluminal filling defects involving the right lower lobe and right middle lobe segmental and subsegmental branches (series 606, image 103 and 124) compatible with pulmonary emboli. Mild cardiomegaly. No pericardial effusion. No definite CT evidence of right heart straining. Mediastinum/Nodes: There is no hilar or mediastinal adenopathy. The esophagus is grossly unremarkable. No thyroid nodules identified. Lungs/Pleura: Left lung base linear and streaky densities, likely atelectatic changes. Infiltrate is less  likely but not excluded.There is no pleural effusion or pneumothorax. Upper Abdomen: No acute abnormality. Musculoskeletal: No chest wall abnormality. No acute or significant osseous findings. Review of the MIP images confirms the above findings. IMPRESSION: Suboptimal evaluation of the pulmonary arteries due to respiratory motion and streak artifact. There is however pulmonary emboli involving the segmental and subsegmental branches of the right middle and right lower lobe. No definite CT evidence of right cardiac straining. Left lung base linear and streaky densities may represent atelectatic changes. These results were called by telephone at the time of interpretation on 12/14/2015 at 10:29 pm to Dr. Lilian Coma , who verbally acknowledged these results.  Electronically Signed   By: Anner Crete M.D.   On: 12/14/2015 22:32    EKG: Independently reviewed.  Assessment/Plan Principal Problem:   Pulmonary embolus ([place])    1. PE - 1. Starting patient on lovenox bridge to coumadin 2. She is breast pumping milk before first dose for now. 3. Trying to get a-hold of peds resident to verify which anticoagulant is preferred for breast feeding. 1. I see that heparin doesn't get into breast milk but cant really discharge patient on heparin gtt 2. LMWH does but is suspected to be safe due to very low PO bioavailability 3. Coumadin apparently does not get into breast milk and is apparently safe during lactation. 4. No data on Xarelto   DVT prophylaxis: heparin gtt Code Status: Full Family Communication: Family at bedside Consults called: None Admission status: Admit to obs   Zahria Ding, [facility] Pager [phone] from 7PM-7AM  If 7AM-7PM, please contact the day physician for the patient [email or web address] Password [email or web address]  12/14/2015, 11:19 PM

## 2015-12-14 NOTE — ED Provider Notes (Signed)
[facility] DEPT Provider Note   CSN: [id number] Arrival date & time: 12/14/15  1600     History   Chief Complaint Chief Complaint  Patient presents with  . Chest Pain    HPI Brittney Sandoval is a 35 y.o. female.   Shortness of Breath  This is a new problem. The average episode lasts 1 day. The problem occurs intermittently.The current episode started 6 to 12 hours ago. The problem has not changed since onset.Associated symptoms include chest pain. Pertinent negatives include no fever, no rhinorrhea, no sore throat, no ear pain, no cough, no hemoptysis, no wheezing, no orthopnea, no vomiting, no abdominal pain (healing c-section site), no rash, no leg pain and no leg swelling. She has tried nothing for the symptoms. She has had prior hospitalizations (recent c-section/delivery). Associated medical issues include recent surgery.    Past Medical History:  Diagnosis Date  . Anemia   . Fibroid   . Hypertension     Patient Active Problem List   Diagnosis Date Noted  . Anemia 11/27/2015  . S/P repeat low transverse C-section 11/26/2015  . Advanced maternal age in multigravida   . Sickle cell trait ([place]) 12/03/2012  . Benign essential hypertension antepartum 12/03/2012  . 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 Obesity (BMI 30-39.9) 10/24/2012    Past Surgical History:  Procedure Laterality Date  . CESAREAN SECTION N/A 04/04/2013   Procedure: CESAREAN SECTION;  Surgeon: Emily Filbert, MD;  Location: [facility];  Service: Obstetrics;  Laterality: N/A;  . CESAREAN SECTION N/A 11/25/2015   Procedure: CESAREAN SECTION;  Surgeon: Everett Graff, MD;  Location: [facility];  Service: Obstetrics;  Laterality: N/A;  . LAPAROSCOPIC CHOLECYSTECTOMY  2010   [place], [place]  . TONSILLECTOMY      OB History    Gravida Para Term Preterm AB Living   '5 5 5     5   '$ SAB TAB Ectopic Multiple Live Births         0  5       Home Medications    Prior to Admission medications   Medication Sig Start Date End Date Taking? Authorizing Provider  desonide (DESOWEN) 0.05 % lotion Apply 1 application topically 2 (two) times daily.    Historical Provider, MD  Fe Fum-FePoly-FA-Vit C-Vit B3 (INTEGRA F) 125-1 MG CAPS Take 1 tablet by mouth daily. Patient not taking: Reported on 11/09/2015 04/07/13   Gwen Pounds, CNM  hydrochlorothiazide (HYDRODIURIL) 25 MG tablet TAKE 1 TABLET BY MOUTH DAILY Patient not taking: Reported on 07/17/2015 01/22/14   Osborne Oman, MD  ibuprofen (ADVIL,MOTRIN) 600 MG tablet Take 1 tablet (600 mg total) by mouth every 6 (six) hours. 11/29/15   Crawford Givens, MD  oxyCODONE (OXY IR/ROXICODONE) 5 MG immediate release tablet Take 1 tablet (5 mg total) by mouth every 4 (four) hours as needed (pain scale 4-7). 11/29/15   Crawford Givens, MD  Prenatal Vit-Fe Fumarate-FA (PRENATAL MULTIVITAMIN) TABS tablet Take 1 tablet by mouth daily at 12 noon.    Historical Provider, MD    Family History Family History  Problem Relation Age of Onset  . Diabetes Mother   . Hypertension Mother   . Kidney disease Mother   . Heart disease Mother     Social History Social History  Substance Use Topics  . Smoking status: Never Smoker  . Smokeless tobacco: Never Used  . Alcohol use Yes     Comment:  occassionally     Allergies   Review of patient's allergies indicates no known allergies.   Review of Systems Review of Systems  Constitutional: Negative for chills and fever.  HENT: Negative for ear pain, rhinorrhea and sore throat.   Eyes: Negative for pain and visual disturbance.  Respiratory: Positive for shortness of breath. Negative for cough, hemoptysis and wheezing.   Cardiovascular: Positive for chest pain. Negative for palpitations, orthopnea and leg swelling.  Gastrointestinal: Negative for abdominal pain (healing c-section site) and vomiting.  Genitourinary: Negative for dysuria and  hematuria.  Musculoskeletal: Negative for arthralgias and back pain.  Skin: Negative for color change and rash.  Neurological: Negative for seizures and syncope.  All other systems reviewed and are negative.    Physical Exam Updated Vital Signs BP 138/81   Pulse 85   Temp 98.7 F (37.1 C) (Oral)   Resp 19   Ht 5' 4.5" (1.638 m)   Wt 94.8 kg   SpO2 100%   BMI 35.32 kg/m   Physical Exam  Constitutional: She is oriented to person, place, and time. She appears well-developed and well-nourished.  HENT:  Head: Normocephalic and atraumatic.  Eyes: Conjunctivae and EOM are normal. Pupils are equal, round, and reactive to light.  Neck: Normal range of motion. Neck supple.  Cardiovascular: Normal rate and regular rhythm.   On minimal movement, patient becomes tachycardic.  Pulmonary/Chest: Breath sounds normal. No respiratory distress.  Patient speaks in sentences but appears winded.  Abdominal: Soft. There is no tenderness.  Musculoskeletal: She exhibits no edema.  Neurological: She is alert and oriented to person, place, and time.  Skin: Skin is warm and dry.  Psychiatric: She has a normal mood and affect.  Nursing note and vitals reviewed.    ED Treatments / Results  Labs (all labs ordered are listed, but only abnormal results are displayed) Labs Reviewed  CBC WITH DIFFERENTIAL/PLATELET - Abnormal; Notable for the following:       Result Value   Hemoglobin 11.1 (*)    HCT 34.5 (*)    MCV 73.2 (*)    MCH 23.6 (*)    RDW 18.8 (*)    All other components within normal limits  COMPREHENSIVE METABOLIC PANEL - Abnormal; Notable for the following:    Albumin 3.4 (*)    All other components within normal limits  D-DIMER, QUANTITATIVE (NOT AT [facility]) - Abnormal; Notable for the following:    D-Dimer, Quant 1.91 (*)    All other components within normal limits  Alphonzo Lemmings, ED    EKG  EKG Interpretation  Date/Time:  Monday December 14 2015 16:36:23  EDT Ventricular Rate:  88 PR Interval:  184 QRS Duration: 92 QT Interval:  356 QTC Calculation: 430 R Axis:   9 Text Interpretation:  Normal sinus rhythm Normal ECG No STEMI.  Confirmed by LONG MD, JOSHUA [phone]) on 12/14/2015 6:42:26 PM       Radiology Ct Angio Chest Pe W And/or Wo Contrast  Result Date: 12/14/2015 CLINICAL DATA:  35 year old female with left-sided chest pain and shortness of breath. Recent C-section. EXAM: CT ANGIOGRAPHY CHEST WITH CONTRAST TECHNIQUE: Multidetector CT imaging of the chest was performed using the standard protocol during bolus administration of intravenous contrast. Multiplanar CT image reconstructions and MIPs were obtained to evaluate the vascular anatomy. CONTRAST:  100 cc Isovue 370 and additional 80 cc of Isovue 370 given for repeat scan. COMPARISON:  None. FINDINGS: Cardiovascular: The thoracic aorta appears unremarkable. The origins  of the great vessels of the aortic arch appear patent. Evaluation of the pulmonary arteries is somewhat limited due to streak artifact and suboptimal visualization of peripheral branches. There are intraluminal filling defects involving the right lower lobe and right middle lobe segmental and subsegmental branches (series 606, image 103 and 124) compatible with pulmonary emboli. Mild cardiomegaly. No pericardial effusion. No definite CT evidence of right heart straining. Mediastinum/Nodes: There is no hilar or mediastinal adenopathy. The esophagus is grossly unremarkable. No thyroid nodules identified. Lungs/Pleura: Left lung base linear and streaky densities, likely atelectatic changes. Infiltrate is less likely but not excluded.There is no pleural effusion or pneumothorax. Upper Abdomen: No acute abnormality. Musculoskeletal: No chest wall abnormality. No acute or significant osseous findings. Review of the MIP images confirms the above findings. IMPRESSION: Suboptimal evaluation of the pulmonary arteries due to respiratory  motion and streak artifact. There is however pulmonary emboli involving the segmental and subsegmental branches of the right middle and right lower lobe. No definite CT evidence of right cardiac straining. Left lung base linear and streaky densities may represent atelectatic changes. These results were called by telephone at the time of interpretation on 12/14/2015 at 10:29 pm to Dr. Lilian Coma , who verbally acknowledged these results. Electronically Signed   By: Anner Crete M.D.   On: 12/14/2015 22:32    Procedures Procedures (including critical care time)  Medications Ordered in ED Medications  iopamidol (ISOVUE-370) 76 % injection 100 mL (not administered)  prenatal vitamin w/FE, FA (PRENATAL 1 + 1) 27-1 MG tablet 1 tablet (not administered)  ibuprofen (ADVIL,MOTRIN) tablet 600 mg (600 mg Oral Given 12/15/15 0515)  oxyCODONE (Oxy IR/ROXICODONE) immediate release tablet 5 mg (5 mg Oral Given 12/15/15 0315)  ferrous sulfate tablet 325 mg (not administered)  Warfarin - Pharmacist Dosing Inpatient (not administered)  enoxaparin (LOVENOX) injection 100 mg (100 mg Subcutaneous Given 12/15/15 0317)  warfarin (COUMADIN) video (not administered)  enoxaparin (LOVENOX) patient education kit (not administered)  acetaminophen (TYLENOL) tablet 1,000 mg (1,000 mg Oral Given 12/14/15 1926)  warfarin (COUMADIN) tablet 7.5 mg (7.5 mg Oral Given 12/15/15 0316)  coumadin book ( Does not apply Given by Other 12/15/15 0537)     Initial Impression / Assessment and Plan / ED Course  I have reviewed the triage vital signs and the nursing notes.  Pertinent labs & imaging results that were available during my care of the patient were reviewed by me and considered in my medical decision making (see chart for details).  Clinical Course   Brittney Sandoval is a 35 year old female with past medical history significant for anemia, hypertension, C-section 3 weeks prior, sickle cell trait who presents for  shortness of breath.  EKG ordered, demonstrates normal sinus rhythm with no concerning features.  Labs ordered including CBC, CMP, troponin, d-dimer.  Results significant for improving anemia, elevated d-dimer.  D-dimer was evaluated against pregnancy adjusted values and found to still be high.  CTA chest performed, limited by artifact.  Demonstrates pulmonary emboli in the right middle and right lower lobes. No evidence of heart strain.  Patient was started on anticoagulation.  She is breast-feeding the risks and benefits were discussed various options.  She prefers to continue breast-feeding and therefore, after consult with pediatrics and incoordination with the admitting hospitalist, the patient will be started on Lovenox and transitioned to Coumadin.  Patient is admitted for further PE treatment.   Final Clinical Impressions(s) / ED Diagnoses   Final diagnoses:  Other acute pulmonary embolism without  acute cor pulmonale [facility])    New Prescriptions New Prescriptions   No medications on file     Elveria Rising, MD 12/15/15 [place], MD 12/15/15 1043

## 2015-12-14 NOTE — ED Notes (Signed)
re paged CARDS to [facility] @[phone] 

## 2015-12-14 NOTE — ED Triage Notes (Signed)
Patient presents today to [facility], She states she is having pain down her left side, the pain starts around her Right breast and goes down into her side. She states that the pain is in her neck as well.

## 2015-12-14 NOTE — ED Provider Notes (Signed)
[facility] [facility]    CSN: [id number] Arrival date & time: 12/14/15  1337     History   Chief Complaint Chief Complaint  Patient presents with  . Left Sided Pain    HPI CHARENE WROBLESKI is a 35 y.o. female.   This is a 35 year old woman who is about 3 weeks postpartum, post C-section with competitions of blood loss and 4 units of transfused blood. She was in her home this morning which she experienced acute sharp substernal chest pain made worse by taking a deep breath. She did not have any shortness of breath but this however.  She's had some discomfort in her legs, but no swelling or localized tenderness.      Past Medical History:  Diagnosis Date  . Anemia   . Fibroid   . Hypertension     Patient Active Problem List   Diagnosis Date Noted  . Anemia 11/27/2015  . S/P repeat low transverse C-section 11/26/2015  . Advanced maternal age in multigravida   . Sickle cell trait ([place]) 12/03/2012  . Benign essential hypertension antepartum 12/03/2012  . 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 Obesity (BMI 30-39.9) 10/24/2012    Past Surgical History:  Procedure Laterality Date  . CESAREAN SECTION N/A 04/04/2013   Procedure: CESAREAN SECTION;  Surgeon: Emily Filbert, MD;  Location: [facility];  Service: Obstetrics;  Laterality: N/A;  . CESAREAN SECTION N/A 11/25/2015   Procedure: CESAREAN SECTION;  Surgeon: Everett Graff, MD;  Location: [facility];  Service: Obstetrics;  Laterality: N/A;  . LAPAROSCOPIC CHOLECYSTECTOMY  2010   [place], [place]  . TONSILLECTOMY      OB History    Gravida Para Term Preterm AB Living   5 5 5     5    SAB TAB Ectopic Multiple Live Births         0 5       Home Medications    Prior to Admission medications   Medication Sig Start Date End Date Taking? Authorizing Provider  ibuprofen (ADVIL,MOTRIN) 600 MG tablet Take 1 tablet (600 mg total) by mouth every  6 (six) hours. 11/29/15  Yes Naima Dillard, MD  oxyCODONE (OXY IR/ROXICODONE) 5 MG immediate release tablet Take 1 tablet (5 mg total) by mouth every 4 (four) hours as needed (pain scale 4-7). 11/29/15  Yes Crawford Givens, MD  Prenatal Vit-Fe Fumarate-FA (PRENATAL MULTIVITAMIN) TABS tablet Take 1 tablet by mouth daily at 12 noon.   Yes Historical Provider, MD  desonide (DESOWEN) 0.05 % lotion Apply 1 application topically 2 (two) times daily.    Historical Provider, MD  Fe Fum-FePoly-FA-Vit C-Vit B3 (INTEGRA F) 125-1 MG CAPS Take 1 tablet by mouth daily. Patient not taking: Reported on 11/09/2015 04/07/13   Gwen Pounds, CNM  hydrochlorothiazide (HYDRODIURIL) 25 MG tablet TAKE 1 TABLET BY MOUTH DAILY Patient not taking: Reported on 07/17/2015 01/22/14   Osborne Oman, MD    Family History Family History  Problem Relation Age of Onset  . Diabetes Mother   . Hypertension Mother   . Kidney disease Mother   . Heart disease Mother     Social History Social History  Substance Use Topics  . Smoking status: Never Smoker  . Smokeless tobacco: Never Used  . Alcohol use Yes     Comment: occassionally     Allergies   Review of patient's allergies indicates no known allergies.   Review of Systems Review  of Systems  Constitutional: Negative.   HENT: Negative.   Respiratory: Positive for chest tightness. Negative for cough, shortness of breath, wheezing and stridor.   Cardiovascular: Positive for chest pain.     Physical Exam Triage Vital Signs ED Triage Vitals  Enc Vitals Group     BP 12/14/15 1349 146/84     Pulse Rate 12/14/15 1349 80     Resp 12/14/15 1349 14     Temp 12/14/15 1349 99.2 F (37.3 C)     Temp Source 12/14/15 1349 Oral     SpO2 12/14/15 1349 100 %     Weight --      Height --      Head Circumference --      Peak Flow --      Pain Score 12/14/15 1513 7     Pain Loc --      Pain Edu? --      Excl. in [place]? --    No data found.   Updated Vital Signs BP  146/84 (BP Location: Left Arm)   Pulse 80   Temp 99.2 F (37.3 C) (Oral)   Resp 14   SpO2 100%   Visual Acuity Right Eye Distance:   Left Eye Distance:   Bilateral Distance:    Right Eye Near:   Left Eye Near:    Bilateral Near:     Physical Exam  Constitutional: She appears well-developed and well-nourished.  HENT:  Head: Normocephalic.  Right Ear: External ear normal.  Left Ear: External ear normal.  Nose: Nose normal.  Mouth/Throat: Oropharynx is clear and moist.  Eyes: Conjunctivae and EOM are normal. Pupils are equal, round, and reactive to light.  Neck: Normal range of motion. Neck supple.  Cardiovascular: Normal rate, regular rhythm, normal heart sounds and intact distal pulses.   Pulmonary/Chest: Effort normal and breath sounds normal.  Skin: Skin is warm and dry.  Nursing note and vitals reviewed.    UC Treatments / Results  Labs (all labs ordered are listed, but only abnormal results are displayed) Labs Reviewed - No data to display  EKG  EKG Interpretation None       Radiology No results found.  Procedures Procedures (including critical care time)  Medications Ordered in UC Medications - No data to display   Initial Impression / Assessment and Plan / UC Course  I have reviewed the triage vital signs and the nursing notes.  Pertinent labs & imaging results that were available during my care of the patient were reviewed by me and considered in my medical decision making (see chart for details).  Clinical Course     Final Clinical Impressions(s) / UC Diagnoses   Final diagnoses:  Acute chest pain  I'm concerned that with the patient's sudden onset of substernal chest pain, stabbing in nature, and in the context of recent gynecologic surgery, she may have a pulmonary embolus. Therefore I'm having her transferred to the emergency department for further evaluation.  New Prescriptions New Prescriptions   No medications on file     Robyn Haber, MD 12/14/15 1544

## 2015-12-14 NOTE — ED Notes (Signed)
Paged CARDS

## 2015-12-14 NOTE — ED Triage Notes (Signed)
Pt st's she was sent to ED from Urgent Care ref. Left chest pain.  Pt st's she just had a c-section on 9-27 and they want to r/o PE.  Pt st's she stared having pain in left chest, under left breast and left side of neck this am.  St's pain increases with deep breathing

## 2015-12-15 DIAGNOSIS — R0789 Other chest pain: Secondary | ICD-10-CM | POA: Diagnosis present

## 2015-12-15 DIAGNOSIS — I2699 Other pulmonary embolism without acute cor pulmonale: Secondary | ICD-10-CM | POA: Diagnosis not present

## 2015-12-15 DIAGNOSIS — I825Z1 Chronic embolism and thrombosis of unspecified deep veins of right distal lower extremity: Secondary | ICD-10-CM | POA: Diagnosis not present

## 2015-12-15 DIAGNOSIS — I10 Essential (primary) hypertension: Secondary | ICD-10-CM | POA: Diagnosis present

## 2015-12-15 DIAGNOSIS — Z6835 Body mass index (BMI) 35.0-35.9, adult: Secondary | ICD-10-CM | POA: Diagnosis not present

## 2015-12-15 DIAGNOSIS — Z79899 Other long term (current) drug therapy: Secondary | ICD-10-CM | POA: Diagnosis not present

## 2015-12-15 DIAGNOSIS — Z98891 History of uterine scar from previous surgery: Secondary | ICD-10-CM

## 2015-12-15 DIAGNOSIS — R51 Headache: Secondary | ICD-10-CM | POA: Diagnosis present

## 2015-12-15 DIAGNOSIS — Z8249 Family history of ischemic heart disease and other diseases of the circulatory system: Secondary | ICD-10-CM | POA: Diagnosis not present

## 2015-12-15 DIAGNOSIS — Z841 Family history of disorders of kidney and ureter: Secondary | ICD-10-CM | POA: Diagnosis not present

## 2015-12-15 DIAGNOSIS — E669 Obesity, unspecified: Secondary | ICD-10-CM

## 2015-12-15 DIAGNOSIS — O8823 Thromboembolism in the puerperium: Secondary | ICD-10-CM | POA: Diagnosis present

## 2015-12-15 DIAGNOSIS — Z833 Family history of diabetes mellitus: Secondary | ICD-10-CM | POA: Diagnosis not present

## 2015-12-15 LAB — PROTIME-INR
INR: 1.1
PROTHROMBIN TIME: 14.2 s (ref 11.4–15.2)

## 2015-12-15 MED ORDER — ACETAMINOPHEN 500 MG PO TABS: 1000.0000 mg | ORAL_TABLET | Freq: Once | ORAL | Status: DC

## 2015-12-15 MED ORDER — WARFARIN SODIUM 7.5 MG PO TABS
7.5000 mg | ORAL_TABLET | Freq: Once | ORAL | Status: AC
  Administered 2015-12-15: 7.5 mg via ORAL
  Filled 2015-12-15: qty 1

## 2015-12-15 MED ORDER — ACETAMINOPHEN 500 MG PO TABS
1000.0000 mg | ORAL_TABLET | Freq: Four times a day (QID) | ORAL | Status: DC | PRN
  Administered 2015-12-15 – 2015-12-16 (×3): 1000 mg via ORAL
  Filled 2015-12-15 (×3): qty 2

## 2015-12-15 MED ORDER — WARFARIN - PHARMACIST DOSING INPATIENT: Freq: Every day | Status: DC

## 2015-12-15 MED ORDER — COUMADIN BOOK
Freq: Once | Status: AC
  Administered 2015-12-15: 06:00:00
  Filled 2015-12-15: qty 1

## 2015-12-15 MED ORDER — ENOXAPARIN (LOVENOX) PATIENT EDUCATION KIT
PACK | Freq: Once | Status: AC
  Administered 2015-12-15: 12:00:00
  Filled 2015-12-15: qty 1

## 2015-12-15 MED ORDER — WARFARIN VIDEO
Freq: Once | Status: AC
  Administered 2015-12-15: 17:00:00

## 2015-12-15 MED ORDER — ENOXAPARIN SODIUM 100 MG/ML ~~LOC~~ SOLN
100.0000 mg | Freq: Two times a day (BID) | SUBCUTANEOUS | Status: DC
  Administered 2015-12-15 – 2015-12-16 (×4): 100 mg via SUBCUTANEOUS
  Filled 2015-12-15 (×3): qty 1

## 2015-12-15 NOTE — Progress Notes (Signed)
ANTICOAGULATION CONSULT NOTE - Initial Consult  Pharmacy Consult for Lovenox/Coumadin Indication: pulmonary embolus  No Known Allergies  Patient Measurements: Height: 5' 4.5" (163.8 cm) Weight: 209 lb (94.8 kg) IBW/kg (Calculated) : 55.85  Vital Signs: Temp: 98.7 F (37.1 C) (10/16 1830) Temp Source: Oral (10/16 1830) BP: 134/89 (10/16 2100) Pulse Rate: 70 (10/16 2100)  Labs:  Recent Labs  12/14/15 1643  HGB 11.1*  HCT 34.5*  PLT 368  CREATININE 0.88    Estimated Creatinine Clearance: 99.8 mL/min (by C-G formula based on SCr of 0.88 mg/dL).   Medical History: Past Medical History:  Diagnosis Date  . Anemia   . Fibroid   . Hypertension     Medications:  No current facility-administered medications on file prior to encounter.    Current Outpatient Prescriptions on File Prior to Encounter  Medication Sig Dispense Refill  . desonide (DESOWEN) 0.05 % lotion Apply 1 application topically 2 (two) times daily.    Marland Kitchen ibuprofen (ADVIL,MOTRIN) 600 MG tablet Take 1 tablet (600 mg total) by mouth every 6 (six) hours. 30 tablet 0  . oxyCODONE (OXY IR/ROXICODONE) 5 MG immediate release tablet Take 1 tablet (5 mg total) by mouth every 4 (four) hours as needed (pain scale 4-7). 30 tablet 0  . Prenatal Vit-Fe Fumarate-FA (PRENATAL MULTIVITAMIN) TABS tablet Take 1 tablet by mouth daily at 12 noon.       Assessment: 35 y.o. female with PE for Lovenox and Coumadin Gave birth via C-Section 9/27 and is currently breastfeeding.  Lovenox 95 mg SQ given in ED  Goal of Therapy:  Full anticoagulation with Lovenox INR 2-3 Monitor platelets by anticoagulation protocol: Yes   Plan:  Lovenox 100 mg SQ q12h Coumadin 7.5 mg tonight Daily INR  Shontavia Mickel, [place] 12/15/2015,1:01 AM

## 2015-12-15 NOTE — ED Notes (Signed)
Pt wanted clarification on medication to be given concerning her breastfeeding her infant.  Informed MD's who were discussing it w/ pharmacy

## 2015-12-15 NOTE — Progress Notes (Signed)
PROGRESS NOTE    Brittney Sandoval  [id number] DOB: 1981/02/03 DOA: 12/14/2015 PCP: Kristine Garbe, MD   Outpatient Specialists:     Brief Narrative:  Brittney Sandoval is a 35 y.o. female with medical history significant of C.Section on 9/27, currently breast feeding.  Patient presents to the ED by way of UC for c/o left sided chest pain.  Pain increases with deep breathing.  Occurs in context of recent C.Section.  Symptoms onset earlier this AM.  Symptoms are constant and persistent.   Assessment & Plan:   Principal Problem:   Pulmonary embolus (HCC) Active Problems:   Obesity (BMI 30-39.9)   S/P repeat low transverse C-section   PE -  lovenox bridge to coumadin Trying to find clinic to monitor INR as an outpatient-- goal 2-3 #2 dose of coumadin 7.5 tonight Duplex LE (had left LE pain before lung pain started)   Headache  -tylenol PRN   DVT prophylaxis:  Fully anticoagulated   Code Status: Full Code   Family Communication:   Disposition Plan:  Home in AM   Consultants:       Subjective: Pain with deep breathing  Objective: Vitals:   12/15/15 0015 12/15/15 0100 12/15/15 0145 12/15/15 0300  BP:    (!) 145/97  Pulse: 70 74 73 77  Resp: 24 21 18 20   Temp:    97.5 F (36.4 C)  TempSrc:    Oral  SpO2: 98% 99% 98% 99%  Weight:    94.4 kg (208 lb 1.6 oz)  Height:    5\' 4"  (1.626 m)   No intake or output data in the 24 hours ending 12/15/15 1230 Filed Weights   12/14/15 1626 12/15/15 0300  Weight: 94.8 kg (209 lb) 94.4 kg (208 lb 1.6 oz)    Examination:  General exam: Appears calm and comfortable  Respiratory system: Clear to auscultation. Respiratory effort normal. Cardiovascular system: S1 & S2 heard, RRR. No JVD, murmurs, rubs, gallops or clicks. No pedal edema. Gastrointestinal system: Abdomen is nondistended, soft and nontender. No organomegaly or masses felt. Normal bowel sounds heard. Central nervous system: Alert and oriented.  No focal neurological deficits.     Data Reviewed: I have personally reviewed following labs and imaging studies  CBC:  Recent Labs Lab 12/14/15 1643  WBC 8.0  NEUTROABS 4.9  HGB 11.1*  HCT 34.5*  MCV 73.2*  PLT 123XX123   Basic Metabolic Panel:  Recent Labs Lab 12/14/15 1643  NA 140  K 4.4  CL 108  CO2 26  GLUCOSE 87  BUN 8  CREATININE 0.88  CALCIUM 9.6   GFR: Estimated Creatinine Clearance: 98.5 mL/min (by C-G formula based on SCr of 0.88 mg/dL). Liver Function Tests:  Recent Labs Lab 12/14/15 1643  AST 23  ALT 24  ALKPHOS 75  BILITOT 0.4  PROT 7.2  ALBUMIN 3.4*   No results for input(s): LIPASE, AMYLASE in the last 168 hours. No results for input(s): AMMONIA in the last 168 hours. Coagulation Profile: No results for input(s): INR, PROTIME in the last 168 hours. Cardiac Enzymes: No results for input(s): CKTOTAL, CKMB, CKMBINDEX, TROPONINI in the last 168 hours. BNP (last 3 results) No results for input(s): PROBNP in the last 8760 hours. HbA1C: No results for input(s): HGBA1C in the last 72 hours. CBG: No results for input(s): GLUCAP in the last 168 hours. Lipid Profile: No results for input(s): CHOL, HDL, LDLCALC, TRIG, CHOLHDL, LDLDIRECT in the last 72 hours. Thyroid Function Tests: No results  for input(s): TSH, T4TOTAL, FREET4, T3FREE, THYROIDAB in the last 72 hours. Anemia Panel: No results for input(s): VITAMINB12, FOLATE, FERRITIN, TIBC, IRON, RETICCTPCT in the last 72 hours. Urine analysis:    Component Value Date/Time   COLORURINE YELLOW 11/25/2014 1726   APPEARANCEUR CLOUDY (A) 11/25/2014 1726   LABSPEC 1.019 11/25/2014 1726   PHURINE 5.0 11/25/2014 1726   GLUCOSEU NEGATIVE 11/25/2014 1726   HGBUR MODERATE (A) 11/25/2014 1726   BILIRUBINUR NEGATIVE 11/25/2014 1726   KETONESUR 15 (A) 11/25/2014 1726   PROTEINUR NEGATIVE 11/25/2014 1726   UROBILINOGEN 1.0 11/25/2014 1726   NITRITE NEGATIVE 11/25/2014 1726   LEUKOCYTESUR SMALL (A)  11/25/2014 1726     )No results found for this or any previous visit (from the past 240 hour(s)).    Anti-infectives    None       Radiology Studies: Ct Angio Chest Pe W And/or Wo Contrast  Result Date: 12/14/2015 CLINICAL DATA:  35 year old female with left-sided chest pain and shortness of breath. Recent C-section. EXAM: CT ANGIOGRAPHY CHEST WITH CONTRAST TECHNIQUE: Multidetector CT imaging of the chest was performed using the standard protocol during bolus administration of intravenous contrast. Multiplanar CT image reconstructions and MIPs were obtained to evaluate the vascular anatomy. CONTRAST:  100 cc Isovue 370 and additional 80 cc of Isovue 370 given for repeat scan. COMPARISON:  None. FINDINGS: Cardiovascular: The thoracic aorta appears unremarkable. The origins of the great vessels of the aortic arch appear patent. Evaluation of the pulmonary arteries is somewhat limited due to streak artifact and suboptimal visualization of peripheral branches. There are intraluminal filling defects involving the right lower lobe and right middle lobe segmental and subsegmental branches (series 606, image 103 and 124) compatible with pulmonary emboli. Mild cardiomegaly. No pericardial effusion. No definite CT evidence of right heart straining. Mediastinum/Nodes: There is no hilar or mediastinal adenopathy. The esophagus is grossly unremarkable. No thyroid nodules identified. Lungs/Pleura: Left lung base linear and streaky densities, likely atelectatic changes. Infiltrate is less likely but not excluded.There is no pleural effusion or pneumothorax. Upper Abdomen: No acute abnormality. Musculoskeletal: No chest wall abnormality. No acute or significant osseous findings. Review of the MIP images confirms the above findings. IMPRESSION: Suboptimal evaluation of the pulmonary arteries due to respiratory motion and streak artifact. There is however pulmonary emboli involving the segmental and subsegmental  branches of the right middle and right lower lobe. No definite CT evidence of right cardiac straining. Left lung base linear and streaky densities may represent atelectatic changes. These results were called by telephone at the time of interpretation on 12/14/2015 at 10:29 pm to Dr. Lilian Coma , who verbally acknowledged these results. Electronically Signed   By: Anner Crete M.D.   On: 12/14/2015 22:32        Scheduled Meds: . enoxaparin (LOVENOX) injection  100 mg Subcutaneous BID  . ferrous sulfate  325 mg Oral BID WC  . prenatal vitamin w/FE, FA  1 tablet Oral Q1200  . warfarin  7.5 mg Oral ONCE-1800  . warfarin   Does not apply Once  . Warfarin - Pharmacist Dosing Inpatient   Does not apply q1800   Continuous Infusions:    LOS: 0 days    Time spent: 25 min    [place], DO [facility] Pager [phone]  If 7PM-7AM, please contact night-coverage [email or web address] Password [email or web address] 12/15/2015, 12:30 PM

## 2015-12-15 NOTE — Care Management Note (Addendum)
Case Management Note  Patient Details  Name: Brittney Sandoval MRN: [id number] Date of Birth: 12/04/1980  Subjective/Objective: Pt presented for Chest Pain and SOB. Post C- Section 11-25-15. Pt is Positive for Pulmonary Embolus.               Action/Plan: CM did call the clinic @ the [facility] and they will not do INR lab draws. CM then called her Primary Care Provider [facility]. Office stated that they send the lab out to a courier service then it takes time to get results. CM did ask Patent examiner could call CM back for further clarification. CM did call [facility] and they stated that if the MD that writes the Rx the results will automatically return to them. This would not be possible. CM then called [facility] to see if they would be willing to see the patient for lab draws. Pt is not established with them. CM did leave voice mail for the pharmacy area to call CM back. CM will continue to f/u.    Expected Discharge Date:                  Expected Discharge Plan:  Home/Self Care  In-House Referral:  NA  Discharge planning Services  CM Consult  Post Acute Care Choice:  NA Choice offered to:  NA  DME Arranged:  N/A DME Agency:  NA  HH Arranged:  NA HH Agency:  NA  Status of Service:  Completed, signed off  If discussed at [place] of Stay Meetings, dates discussed:    Additional Comments: 1154 12-16-15 Jacqlyn Krauss, RN,BSN [phone] [name] on Lovenox Injections 150 mg syringes Q 24 hours. [name]. CM did call to make sure that medication is available- only generic available. [facility] [address] [facility] of [employer] has medication available. CM did make pt aware. No further needs from CM at this time.   12-16-15 [id number] Jacqlyn Krauss, RN,BSN [phone] CM did receive call back from [facility] and they will only see the patients that are established with the office. CM will continue to  monitor.  Bethena Roys, RN 12/15/2015, 4:04 PM

## 2015-12-16 ENCOUNTER — Telehealth (HOSPITAL_COMMUNITY): Payer: Self-pay | Admitting: Lactation Services

## 2015-12-16 ENCOUNTER — Inpatient Hospital Stay (HOSPITAL_COMMUNITY): Payer: Medicaid Other

## 2015-12-16 DIAGNOSIS — I825Z1 Chronic embolism and thrombosis of unspecified deep veins of right distal lower extremity: Secondary | ICD-10-CM

## 2015-12-16 LAB — PROTIME-INR
INR: 1.23
PROTHROMBIN TIME: 15.6 s — AB (ref 11.4–15.2)

## 2015-12-16 MED ORDER — ENOXAPARIN SODIUM 150 MG/ML ~~LOC~~ SOLN: 150.0000 mg | SUBCUTANEOUS | Status: DC

## 2015-12-16 MED ORDER — ENOXAPARIN SODIUM 150 MG/ML ~~LOC~~ SOLN: 150.0000 mg | SUBCUTANEOUS | 3 refills | Status: DC

## 2015-12-16 NOTE — Discharge Summary (Signed)
Physician Discharge Summary  Brittney Sandoval [id number] DOB: Jul 11, 1980 DOA: 12/14/2015  PCP: Kristine Garbe, MD  Admit date: 12/14/2015 Discharge date: 12/16/2015   Recommendations for Outpatient Follow-Up:   1. lovenox while breast feeding, if patient stops, then can be changed to xarelto/eliquis   Discharge Diagnosis:   Principal Problem:   Pulmonary embolus ([place]) Active Problems:   Obesity (BMI 30-39.9)   S/P repeat low transverse C-section   Pulmonary embolism [facility])   Discharge disposition:  Home.  Discharge Condition: Improved.  Diet recommendation:   Regular.  Wound care: None.   History of Present Illness:   Brittney Sandoval is a 35 y.o. female with medical history significant of C.Section on 9/27, currently breast feeding.  Patient presents to the ED by way of UC for c/o left sided chest pain.  Pain increases with deep breathing.  Occurs in context of recent C.Section.  Symptoms onset earlier this AM.  Symptoms are constant and persistent.   Hospital Course by Problem:   PE -  lovenox-- unable to find a coumadin clinic for patient-- if patient stops breast feeding, can be changed to xarelto/eliquis Duplex LE pending-- + left DVT- no mobile thrombus              Headache             -tylenol PRN    Medical Consultants:    None.   Discharge Exam:   Vitals:   12/15/15 2040 12/16/15 0531  BP: (!) 137/93 (!) 144/77  Pulse: 74 72  Resp: 18 16  Temp: 98.4 F (36.9 C) 98.6 F (37 C)   Vitals:   12/15/15 0300 12/15/15 1414 12/15/15 2040 12/16/15 0531  BP: (!) 145/97 138/81 (!) 137/93 (!) 144/77  Pulse: 77 82 74 72  Resp: 20 20 18 16   Temp: 97.5 F (36.4 C) 98.4 F (36.9 C) 98.4 F (36.9 C) 98.6 F (37 C)  TempSrc: Oral Oral Oral Oral  SpO2: 99% 97% 98% 96%  Weight: 94.4 kg (208 lb 1.6 oz)   95.4 kg (210 lb 4.8 oz)  Height: 5\' 4"  (1.626 m)       Gen:  NAD   The results of significant diagnostics from this hospitalization  (including imaging, microbiology, ancillary and laboratory) are listed below for reference.     Procedures and Diagnostic Studies:   Ct Angio Chest Pe W And/or Wo Contrast  Result Date: 12/14/2015 CLINICAL DATA:  35 year old female with left-sided chest pain and shortness of breath. Recent C-section. EXAM: CT ANGIOGRAPHY CHEST WITH CONTRAST TECHNIQUE: Multidetector CT imaging of the chest was performed using the standard protocol during bolus administration of intravenous contrast. Multiplanar CT image reconstructions and MIPs were obtained to evaluate the vascular anatomy. CONTRAST:  100 cc Isovue 370 and additional 80 cc of Isovue 370 given for repeat scan. COMPARISON:  None. FINDINGS: Cardiovascular: The thoracic aorta appears unremarkable. The origins of the great vessels of the aortic arch appear patent. Evaluation of the pulmonary arteries is somewhat limited due to streak artifact and suboptimal visualization of peripheral branches. There are intraluminal filling defects involving the right lower lobe and right middle lobe segmental and subsegmental branches (series 606, image 103 and 124) compatible with pulmonary emboli. Mild cardiomegaly. No pericardial effusion. No definite CT evidence of right heart straining. Mediastinum/Nodes: There is no hilar or mediastinal adenopathy. The esophagus is grossly unremarkable. No thyroid nodules identified. Lungs/Pleura: Left lung base linear and streaky densities, likely atelectatic changes. Infiltrate is  less likely but not excluded.There is no pleural effusion or pneumothorax. Upper Abdomen: No acute abnormality. Musculoskeletal: No chest wall abnormality. No acute or significant osseous findings. Review of the MIP images confirms the above findings. IMPRESSION: Suboptimal evaluation of the pulmonary arteries due to respiratory motion and streak artifact. There is however pulmonary emboli involving the segmental and subsegmental branches of the right middle  and right lower lobe. No definite CT evidence of right cardiac straining. Left lung base linear and streaky densities may represent atelectatic changes. These results were called by telephone at the time of interpretation on 12/14/2015 at 10:29 pm to Dr. Lilian Coma , who verbally acknowledged these results. Electronically Signed   By: Anner Crete M.D.   On: 12/14/2015 22:32     Labs:   Basic Metabolic Panel:  Recent Labs Lab 12/14/15 1643  NA 140  K 4.4  CL 108  CO2 26  GLUCOSE 87  BUN 8  CREATININE 0.88  CALCIUM 9.6   GFR Estimated Creatinine Clearance: 99.1 mL/min (by C-G formula based on SCr of 0.88 mg/dL). Liver Function Tests:  Recent Labs Lab 12/14/15 1643  AST 23  ALT 24  ALKPHOS 75  BILITOT 0.4  PROT 7.2  ALBUMIN 3.4*   No results for input(s): LIPASE, AMYLASE in the last 168 hours. No results for input(s): AMMONIA in the last 168 hours. Coagulation profile  Recent Labs Lab 12/15/15 1348 12/16/15 0415  INR 1.10 1.23    CBC:  Recent Labs Lab 12/14/15 1643  WBC 8.0  NEUTROABS 4.9  HGB 11.1*  HCT 34.5*  MCV 73.2*  PLT 368   Cardiac Enzymes: No results for input(s): CKTOTAL, CKMB, CKMBINDEX, TROPONINI in the last 168 hours. BNP: Invalid input(s): POCBNP CBG: No results for input(s): GLUCAP in the last 168 hours. D-Dimer  Recent Labs  12/14/15 1643  DDIMER 1.91*   Hgb A1c No results for input(s): HGBA1C in the last 72 hours. Lipid Profile No results for input(s): CHOL, HDL, LDLCALC, TRIG, CHOLHDL, LDLDIRECT in the last 72 hours. Thyroid function studies No results for input(s): TSH, T4TOTAL, T3FREE, THYROIDAB in the last 72 hours.  Invalid input(s): FREET3 Anemia work up No results for input(s): VITAMINB12, FOLATE, FERRITIN, TIBC, IRON, RETICCTPCT in the last 72 hours. Microbiology No results found for this or any previous visit (from the past 240 hour(s)).   Discharge Instructions:   Discharge Instructions    Diet  general    Complete by:  As directed    Discharge instructions    Complete by:  As directed    lovenox for PE treatment-- if you stop breast feeding, can be changed to a newer agent like xarelto/eliquis   Increase activity slowly    Complete by:  As directed        Medication List    STOP taking these medications   ibuprofen 600 MG tablet Commonly known as:  ADVIL,MOTRIN     TAKE these medications   desonide 0.05 % lotion Commonly known as:  DESOWEN Apply 1 application topically 2 (two) times daily.   enoxaparin 150 MG/ML injection Commonly known as:  LOVENOX Inject 1 mL (150 mg total) into the skin daily.   ferrous sulfate 325 (65 FE) MG tablet Take 1 tablet by mouth 2 (two) times daily.   oxyCODONE 5 MG immediate release tablet Commonly known as:  Oxy IR/ROXICODONE Take 1 tablet (5 mg total) by mouth every 4 (four) hours as needed (pain scale 4-7).   prenatal multivitamin  Tabs tablet Take 1 tablet by mouth daily at 12 noon.         Time coordinating discharge: 35 min  Signed:  Nataniel Gasper U Tymika Grilli   [facility] 12/16/2015, 11:31 AM

## 2015-12-16 NOTE — Discharge Instructions (Signed)

## 2015-12-16 NOTE — Progress Notes (Signed)
[facility] for Lovenox/Coumadin Indication: pulmonary embolus  Assessment: 35 y.o. female with PE started on lovenox and coumadin. Patient received 2 doses of coumadin and INR is 1.2 this am. Difficult monitoring situation, PCP monitoring is reporting a 48 hour delay in lab turn around so that is not ideal and case management unable to find an acceptable alternative. After discussion with Dr.Vann will continue with lovenox alone (daily dosing of 1.5mg /kg/day) patient received dose this morning so will start new dose tonight.   DOACs do not have breastfeeding data so they are not recommended, if she stops breastfeeding in the near future transition to a DOAC may be an alternative to the lovenox shots.   Gave birth via C-Section 9/27 and is currently breastfeeding.  Lovenox 95 mg SQ given in ED  Goal of Therapy:  Full anticoagulation with Lovenox Monitor platelets by anticoagulation protocol: Yes   Plan:  Lovenox 150 mg SQ q24 hours Stop warfarin and INR checks CBC in am if here  Will follow up education on lovenox  No Known Allergies  Patient Measurements: Height: 5\' 4"  (162.6 cm) Weight: 210 lb 4.8 oz (95.4 kg) IBW/kg (Calculated) : 54.7  Vital Signs: Temp: 98.6 F (37 C) (10/18 0531) Temp Source: Oral (10/18 0531) BP: 144/77 (10/18 0531) Pulse Rate: 72 (10/18 0531)  Labs:  Recent Labs  12/14/15 1643 12/15/15 1348 12/16/15 0415  HGB 11.1*  --   --   HCT 34.5*  --   --   PLT 368  --   --   LABPROT  --  14.2 15.6*  INR  --  1.10 1.23  CREATININE 0.88  --   --     Estimated Creatinine Clearance: 99.1 mL/min (by C-G formula based on SCr of 0.88 mg/dL).   Medical History: Past Medical History:  Diagnosis Date  . Anemia   . Fibroid   . Hypertension     Medications:  No current facility-administered medications on file prior to encounter.    Current Outpatient Prescriptions on File Prior to Encounter  Medication Sig  Dispense Refill  . desonide (DESOWEN) 0.05 % lotion Apply 1 application topically 2 (two) times daily.    Marland Kitchen ibuprofen (ADVIL,MOTRIN) 600 MG tablet Take 1 tablet (600 mg total) by mouth every 6 (six) hours. 30 tablet 0  . oxyCODONE (OXY IR/ROXICODONE) 5 MG immediate release tablet Take 1 tablet (5 mg total) by mouth every 4 (four) hours as needed (pain scale 4-7). 30 tablet 0  . Prenatal Vit-Fe Fumarate-FA (PRENATAL MULTIVITAMIN) TABS tablet Take 1 tablet by mouth daily at 12 noon.      Erin Hearing PharmD., BCPS Clinical Pharmacist Pager [phone] 12/16/2015 11:13 AM

## 2015-12-16 NOTE — Telephone Encounter (Signed)
Kadeidra called [facility] requesting information on breastfeeding while taking a medication. She has been at [facility] for treatment of a PE.  On day of discharge, she was given Rx for Lovenox SQ 150 mg daily.  She is 3 weeks post partum and exclusively breastfeeding her infant.  Information shared with Taegan from [facility] and [facility].  Both sites state that at doses of up to 40 mg daily, there have been no adverse effects in breastfed infants.  Because of its large molecular weight, it wouldn't be expected to be excreted into breastmilk, or to be absorbed from breastmilk by the infant.  No precautions are required.  Suggested Jennette verify dose amount with her physician.    She could ask her Pediatrician about monitoring blood levels in her baby and observe for adverse effects such as bruising, blood in stool, urine or spit.

## 2015-12-16 NOTE — Progress Notes (Signed)
*  PRELIMINARY RESULTS* Vascular Ultrasound Lower extremity venous duplex has been completed.  Preliminary findings: Short segment of DVT noted in the left peroneal veins. No DVT noted in the RLE.   Landry Mellow, RDMS, RVT  12/16/2015, 4:11 PM

## 2016-01-15 ENCOUNTER — Telehealth: Payer: Self-pay | Admitting: Cardiology

## 2016-01-15 ENCOUNTER — Other Ambulatory Visit: Payer: Self-pay

## 2016-01-15 NOTE — Telephone Encounter (Signed)
Records received from [facility] for apt on 01/18/16 with Kerin Ransom PA. Records given to [employer] (medical records) CN

## 2016-01-18 ENCOUNTER — Encounter: Payer: Self-pay | Admitting: Cardiology

## 2016-01-18 ENCOUNTER — Ambulatory Visit (INDEPENDENT_AMBULATORY_CARE_PROVIDER_SITE_OTHER): Payer: Medicaid Other | Admitting: Cardiology

## 2016-01-18 VITALS — BP 145/94 | HR 68 | Ht 64.0 in | Wt 204.0 lb

## 2016-01-18 DIAGNOSIS — I10 Essential (primary) hypertension: Secondary | ICD-10-CM | POA: Diagnosis not present

## 2016-01-18 DIAGNOSIS — I2699 Other pulmonary embolism without acute cor pulmonale: Secondary | ICD-10-CM

## 2016-01-18 DIAGNOSIS — O903 Peripartum cardiomyopathy: Secondary | ICD-10-CM

## 2016-01-18 DIAGNOSIS — O10019 Pre-existing essential hypertension complicating pregnancy, unspecified trimester: Secondary | ICD-10-CM | POA: Diagnosis not present

## 2016-01-18 DIAGNOSIS — E669 Obesity, unspecified: Secondary | ICD-10-CM

## 2016-01-18 MED ORDER — RIVAROXABAN 20 MG PO TABS: 20.0000 mg | ORAL_TABLET | Freq: Every day | ORAL | 4 refills | Status: DC

## 2016-01-18 MED ORDER — AMLODIPINE BESYLATE 5 MG PO TABS: 5.0000 mg | ORAL_TABLET | Freq: Every day | ORAL | 5 refills | Status: DC

## 2016-01-18 NOTE — Patient Instructions (Addendum)
Medication Instructions:  Your physician has recommended you make the following change in your medication:  1.  STOP the Clonidine 2.  STOP the Lovenox Injection 3.  START the Amlodipine 5 mg taking 1 tablet daily 4.  START the Xarelto 20 mg taking 1 tablet daily  Labwork: None ordered  Testing/Procedures: Your physician has requested that you have an echocardiogram. Echocardiography is a painless test that uses sound waves to create images of your heart. It provides your doctor with information about the size and shape of your heart and how well your heart's chambers and valves are working. This procedure takes approximately one hour. There are no restrictions for this procedure.   Follow-Up: Your physician recommends that you schedule a follow-up appointment in: 2 [place] PHARM-D FOR BLOOD PRESSURE RECHECK  Your physician recommends that you schedule a follow-up appointment in: [place] DR. CRENSHAW     Any Other Special Instructions Will Be Listed Below (If Applicable).   Echocardiogram An echocardiogram, or echocardiography, uses sound waves (ultrasound) to produce an image of your heart. The echocardiogram is simple, painless, obtained within a short period of time, and offers valuable information to your health care provider. The images from an echocardiogram can provide information such as:  Evidence of coronary artery disease (CAD).  Heart size.  Heart muscle function.  Heart valve function.  Aneurysm detection.  Evidence of a past heart attack.  Fluid buildup around the heart.  Heart muscle thickening.  Assess heart valve function. Tell a health care provider about:  Any allergies you have.  All medicines you are taking, including vitamins, herbs, eye drops, creams, and over-the-counter medicines.  Any problems you or family members have had with anesthetic medicines.  Any blood disorders you have.  Any surgeries you have had.  Any medical  conditions you have.  Whether you are pregnant or may be pregnant. What happens before the procedure? No special preparation is needed. Eat and drink normally. What happens during the procedure?  In order to produce an image of your heart, gel will be applied to your chest and a wand-like tool (transducer) will be moved over your chest. The gel will help transmit the sound waves from the transducer. The sound waves will harmlessly bounce off your heart to allow the heart images to be captured in real-time motion. These images will then be recorded.  You may need an IV to receive a medicine that improves the quality of the pictures. What happens after the procedure? You may return to your normal schedule including diet, activities, and medicines, unless your health care provider tells you otherwise. This information is not intended to replace advice given to you by your health care provider. Make sure you discuss any questions you have with your health care provider. Document Released: 02/12/2000 Document Revised: 10/03/2015 Document Reviewed: 10/22/2012 Elsevier Interactive Patient Education  2017 [employer].   If you need a refill on your cardiac medications before your next appointment, please call your pharmacy.

## 2016-01-18 NOTE — Assessment & Plan Note (Signed)
PE and LLE DVT 12/15/15- s/p C-section 11/25/15

## 2016-01-18 NOTE — Assessment & Plan Note (Signed)
BMI 35 

## 2016-01-18 NOTE — Assessment & Plan Note (Signed)
B/P 142/96 by me with regular cuff

## 2016-01-18 NOTE — Progress Notes (Signed)
01/18/2016 Brittney Sandoval   1980-11-03  [id number]  Primary Physician Kristine Garbe, MD Primary Cardiologist: Dr Stanford Breed  HPI:  Pleasant 35 y/o female referred to [place] by her PCP for help with B/P control and f/u after recent acute PE. The pt has 4 children ages 19-11-9-2 and just delivered a healthy girl by C-section 11/25/15. Her course was complicated ane,mia requiring transfusion. She does have a history of HTN and had actually been on Diltiazem before her last pregnancy but stopped this when she became pregnant. She tells me her B/P was actually pretty well controlled during her pregnancy. On 12/14/15 she presented with chest pain and was found to have pulmonary emboli involving the segmental and subsegmental branches of the right middle and right lower lobe. LE venous dopplers showed a Lt peroneal DVT. She was breast feeding at the time and was placed on Lovenox. After discharge she saw her PCP and her B/P was elevated. She was placed on Labetalol but apparently this was not effective. She was changed to clonidine 0.2 mg BID and has stopped breast feeding. She has had some vague SOB at night but no unusual DOE during the day.    Current Outpatient Prescriptions  Medication Sig Dispense Refill  . desonide (DESOWEN) 0.05 % lotion Apply 1 application topically 2 (two) times daily.    . ferrous sulfate 325 (65 FE) MG tablet Take 1 tablet by mouth 2 (two) times daily.  1  . oxyCODONE (OXY IR/ROXICODONE) 5 MG immediate release tablet Take 1 tablet (5 mg total) by mouth every 4 (four) hours as needed (pain scale 4-7). 30 tablet 0  . Prenatal Vit-Fe Fumarate-FA (PRENATAL MULTIVITAMIN) TABS tablet Take 1 tablet by mouth daily at 12 noon.    Marland Kitchen amLODipine (NORVASC) 5 MG tablet Take 1 tablet (5 mg total) by mouth daily. 30 tablet 5  . rivaroxaban (XARELTO) 20 MG TABS tablet Take 1 tablet (20 mg total) by mouth daily with supper. 30 tablet 4   No current facility-administered medications for this  visit.     No Known Allergies  Social History   Social History  . Marital status: Single    Spouse name: N/A  . Number of children: N/A  . Years of education: N/A   Occupational History  . Not on file.   Social History Main Topics  . Smoking status: Never Smoker  . Smokeless tobacco: Never Used  . Alcohol use Yes     Comment: occassionally  . Drug use: No  . Sexual activity: Not Currently    Birth control/ protection: None   Other Topics Concern  . Not on file   Social History Narrative   Lives in [place].  Relocated to [place] a few years ago.    Past Medical History:  Diagnosis Date  . Anemia   . Fibroid   . Hypertension    Family History  Problem Relation Age of Onset  . Diabetes Mother   . Hypertension Mother   . Kidney disease Mother   . Heart disease Mother     Review of Systems: General: negative for chills, fever, night sweats or weight changes.  Cardiovascular: negative for chest pain, dyspnea on exertion, edema, orthopnea, palpitations, paroxysmal nocturnal dyspnea or shortness of breath Dermatological: negative for rash Respiratory: negative for cough or wheezing Urologic: negative for hematuria Abdominal: negative for nausea, vomiting, diarrhea, bright red blood per rectum, melena, or hematemesis Neurologic: negative for visual changes, syncope, or dizziness All other systems  reviewed and are otherwise negative except as noted above.    Blood pressure (!) 145/94, pulse 68, height 5\' 4"  (1.626 m), weight 204 lb (92.5 kg), unknown if currently breastfeeding.  General appearance: alert, cooperative, no distress and moderately obese Neck: no carotid bruit and no JVD HEENT; normocephalic,  Lungs: clear to auscultation bilaterally Heart: regular rate and rhythm without murmur or S3 Abd: obese, non tender Extremities: extremities normal, atraumatic, no cyanosis or edema Pulses: 2+ and symmetric Skin: Skin color, texture, turgor normal. No  rashes or lesions Neurologic: Grossly normal  EKG NSR  ASSESSMENT AND PLAN:   Uncontrolled hypertension B/P 142/96 by me with regular cuff  Acute pulmonary embolism (HCC) PE and LLE DVT 12/15/15- s/p C-section 11/25/15  Obesity (BMI 30-39.9) BMI 35   PLAN  Discussed with Dr Stanford Breed who saw the pt with me, and Erasmo Downer our pharmacist. Since she is not breast feeding no so we can change her Lovenox to Xarelto 20 mg daily (she has already had a month of Lovenox). Dr Stanford Breed suggest she continue this for 6 months total. I have added Norvasc 5 mg and told her to stop the clonidine, (she has only been on it a short time and has uncomfortable side effects when she does take it). She will follow up with Erasmo Downer for a B/P check in a couple of weeks. She have an echo done to r/o post partum CM and to access her for HCVD. She'll f/u with Dr Stanford Breed in 6-8 weeks.   Kerin Ransom PA-C 01/18/2016 5:00 PM

## 2016-01-19 ENCOUNTER — Telehealth: Payer: Self-pay | Admitting: Cardiology

## 2016-01-19 NOTE — Telephone Encounter (Signed)
Spoke with pharmacist Kim-notified of medication instructions: 1.  STOP the Clonidine 2.  STOP the Lovenox Injection 3.  START the Amlodipine 5 mg taking 1 tablet daily 4.  START the Xarelto 20 mg taking 1 tablet daily

## 2016-01-19 NOTE — Telephone Encounter (Signed)
New message    Pt verbalized that she was suppose to change blood thinner medications from the lovanox to the xarelto but the pharmacy stated that it is a drug interaction   Pt did verbalize that she did change bp medications  It is currently at 164/97

## 2016-01-19 NOTE — Telephone Encounter (Signed)
Spoke with pharmacist and clarified change in medications Advised patient to start Xarelto tonight since she has been taking Lovenox in the evening She was not home to recheck her blood pressure She will continue to monitor and call back tomorrow if still elevated

## 2016-01-19 NOTE — Telephone Encounter (Signed)
This was taken care of this this morning pharmacy already called to verify that pt was stopping Lovenox. pharmacist states that Xarelto is ready for pick up.  Pt notified

## 2016-01-19 NOTE — Telephone Encounter (Signed)
New message      Calling with a possible drug interaction between lovenox and xarelto.  Please call

## 2016-01-27 ENCOUNTER — Encounter: Payer: Self-pay | Admitting: Oncology

## 2016-01-27 ENCOUNTER — Telehealth: Payer: Self-pay | Admitting: Oncology

## 2016-01-27 NOTE — Telephone Encounter (Signed)
Pt confirmed appt, verified demo and insurance, mailed pt letter, and faxed referring provider appt date/time °

## 2016-01-28 ENCOUNTER — Ambulatory Visit: Payer: Medicaid Other | Admitting: Cardiovascular Disease

## 2016-02-08 ENCOUNTER — Other Ambulatory Visit (HOSPITAL_COMMUNITY): Payer: Medicaid Other

## 2016-02-11 ENCOUNTER — Telehealth: Payer: Self-pay | Admitting: Cardiology

## 2016-02-11 NOTE — Telephone Encounter (Signed)
02/11/2016 Rcvd packet from [facility] for Dr. Stanford Breed for apt 03/08/2016. Packet given to [employer]. mwc

## 2016-02-12 ENCOUNTER — Encounter: Payer: Self-pay | Admitting: Pharmacist

## 2016-02-12 ENCOUNTER — Ambulatory Visit (INDEPENDENT_AMBULATORY_CARE_PROVIDER_SITE_OTHER): Payer: Medicaid Other | Admitting: Pharmacist

## 2016-02-12 VITALS — BP 144/92 | HR 74

## 2016-02-12 DIAGNOSIS — O10019 Pre-existing essential hypertension complicating pregnancy, unspecified trimester: Secondary | ICD-10-CM | POA: Diagnosis not present

## 2016-02-12 MED ORDER — AMLODIPINE BESYLATE 10 MG PO TABS: 10.0000 mg | ORAL_TABLET | Freq: Every day | ORAL | 5 refills | Status: DC

## 2016-02-12 NOTE — Patient Instructions (Signed)
Return for a follow up appointment as scheduled with Dr. Stanford Breed  Check your blood pressure at home daily (if able) and keep record of the readings.  Take your BP meds as follows: INCREASE amlodipine to 10mg  once daily (you may take 2 of your current supply then take 1 tablet once you pick up the higher strength from the pharmacy)  Bring all of your meds, your BP cuff and your record of home blood pressures to your next appointment.  Exercise as you're able, try to walk approximately 30 minutes per day.  Keep salt intake to a minimum, especially watch canned and prepared boxed foods.  Eat more fresh fruits and vegetables and fewer canned items.  Avoid eating in fast food restaurants.    HOW TO TAKE YOUR BLOOD PRESSURE: . Rest 5 minutes before taking your blood pressure. .  Don't smoke or drink caffeinated beverages for at least 30 minutes before. . Take your blood pressure before (not after) you eat. . Sit comfortably with your back supported and both feet on the floor (don't cross your legs). . Elevate your arm to heart level on a table or a desk. . Use the proper sized cuff. It should fit smoothly and snugly around your bare upper arm. There should be enough room to slip a fingertip under the cuff. The bottom edge of the cuff should be 1 inch above the crease of the elbow. . Ideally, take 3 measurements at one sitting and record the average.

## 2016-02-12 NOTE — Progress Notes (Signed)
Patient ID: Brittney Sandoval                 DOB: [birth year]                      MRN: [id number]     HPI: Brittney Sandoval is a 35 y.o. female patient of Dr. Stanford Breed with PMH below who presents today for hypertension evaluation. She was recently seen by Kerin Ransom, PA for follow up after acute PE. At this visit her lovenox was changed to Xarelto as she was no longer breast feeding. Plan for 6 months of anticoagulation. Her clonidine was also changed to amlodipine 5mg  daily.   She presents today with her 63 month old baby daughter. She reports doing well since her recent medication changes. She states her pressures have been better but she does still occasionally have elevated pressures to 140s/90s.   She usually checks her pressures before her medications in the morning. She states she occasionally checks when she has a headache and has noticed that recently her headaches have become less prevalent.   She is not currently breastfeeding.   Cardiac Hx: HTN, PE  Current HTN meds:  Amlodipine 5mg  daily  Previously tried: diltiazem prior to last pregnancy - stopped due to pregnancy Labetalol - not effective  Clonidine 0.2mg  BID  BP goal: <130/80  Family History: Both her mother and father have high blood pressure. Her mother also has RVR.   Social History: She denies tobacco products. She does occasionally drink wine.   Diet: She prepares most of her meals from home. She recently started using just spices rather than salt and has tried to watch her sodium intake. She endorses drinking a cup of coffee per day and admits she had a cappuccino just prior to coming in today. She tries to avoid soda and tea.   Exercise: She is trying to get back into exercising and walking after her c-section about 3 months ago.   Home BP readings: She did not bring log with her today. She reports that yesterday her pressure was 147/92 with her arm cuff. She states in the days prior her measurements were  110-120s  Wt Readings from Last 3 Encounters:  01/18/16 204 lb (92.5 kg)  12/16/15 210 lb 4.8 oz (95.4 kg)  11/25/15 234 lb (106.1 kg)   BP Readings from Last 3 Encounters:  02/12/16 (!) 144/92  01/18/16 (!) 145/94  12/16/15 139/83   Pulse Readings from Last 3 Encounters:  02/12/16 74  01/18/16 68  12/16/15 76    Renal function: CrCl cannot be calculated (Patient's most recent lab result is older than the maximum 21 days allowed.).  Past Medical History:  Diagnosis Date  . Anemia   . Fibroid   . Hypertension     Current Outpatient Prescriptions on File Prior to Visit  Medication Sig Dispense Refill  . ferrous sulfate 325 (65 FE) MG tablet Take 1 tablet by mouth 2 (two) times daily.  1  . oxyCODONE (OXY IR/ROXICODONE) 5 MG immediate release tablet Take 1 tablet (5 mg total) by mouth every 4 (four) hours as needed (pain scale 4-7). 30 tablet 0  . Prenatal Vit-Fe Fumarate-FA (PRENATAL MULTIVITAMIN) TABS tablet Take 1 tablet by mouth daily at 12 noon.    . rivaroxaban (XARELTO) 20 MG TABS tablet Take 1 tablet (20 mg total) by mouth daily with supper. 30 tablet 4   No current facility-administered medications on file prior to visit.  No Known Allergies  Blood pressure (!) 144/92, pulse 74, SpO2 98 %, unknown if currently breastfeeding.   Assessment/Plan: Hypertension: BP not at goal less than 130/80. Discussed that if she becomes pregnant we will need to change her medications as amlodipine and Xarelto are not proven to be safe during pregnancy. Increase amlodipine to 10mg  daily. Have asked she monitor her pressure and bring log to follow up. She can follow up with hypertension clinic if needed after visit with Dr. Stanford Breed in January.    Thank you, Lelan Pons. Patterson Hammersmith, [facility]  02/12/2016 12:11 PM

## 2016-02-24 ENCOUNTER — Other Ambulatory Visit: Payer: Self-pay

## 2016-02-24 ENCOUNTER — Ambulatory Visit (HOSPITAL_COMMUNITY): Payer: Medicaid Other | Attending: Cardiology

## 2016-02-24 DIAGNOSIS — I071 Rheumatic tricuspid insufficiency: Secondary | ICD-10-CM | POA: Insufficient documentation

## 2016-02-24 DIAGNOSIS — O903 Peripartum cardiomyopathy: Secondary | ICD-10-CM | POA: Diagnosis present

## 2016-03-01 NOTE — Progress Notes (Deleted)
      HPI: FU pulmonary embolus and hypertension. Delivered by csection 9/17. Had PE 10/17 and placed on xarelto. Lower ext dopplers showed DVT. Echo 12/17 showed normal LV function. Since last seen,   Current Outpatient Prescriptions  Medication Sig Dispense Refill  . amLODipine (NORVASC) 10 MG tablet Take 1 tablet (10 mg total) by mouth daily. 30 tablet 5  . desonide (DESOWEN) 0.05 % ointment Apply 1 application topically 2 (two) times daily.    . ferrous sulfate 325 (65 FE) MG tablet Take 1 tablet by mouth 2 (two) times daily.  1  . oxyCODONE (OXY IR/ROXICODONE) 5 MG immediate release tablet Take 1 tablet (5 mg total) by mouth every 4 (four) hours as needed (pain scale 4-7). 30 tablet 0  . Prenatal Vit-Fe Fumarate-FA (PRENATAL MULTIVITAMIN) TABS tablet Take 1 tablet by mouth daily at 12 noon.    . rivaroxaban (XARELTO) 20 MG TABS tablet Take 1 tablet (20 mg total) by mouth daily with supper. 30 tablet 4   No current facility-administered medications for this visit.      Past Medical History:  Diagnosis Date  . Anemia   . Fibroid   . Hypertension     Past Surgical History:  Procedure Laterality Date  . CESAREAN SECTION N/A 04/04/2013   Procedure: CESAREAN SECTION;  Surgeon: Emily Filbert, MD;  Location: [facility];  Service: Obstetrics;  Laterality: N/A;  . CESAREAN SECTION N/A 11/25/2015   Procedure: CESAREAN SECTION;  Surgeon: Everett Graff, MD;  Location: [facility];  Service: Obstetrics;  Laterality: N/A;  . LAPAROSCOPIC CHOLECYSTECTOMY  2010   [place], [place]  . TONSILLECTOMY      Social History   Social History  . Marital status: Single    Spouse name: N/A  . Number of children: N/A  . Years of education: N/A   Occupational History  . Not on file.   Social History Main Topics  . Smoking status: Never Smoker  . Smokeless tobacco: Never Used  . Alcohol use Yes     Comment: occassionally  . Drug use: No  . Sexual activity: Not Currently   Birth control/ protection: None   Other Topics Concern  . Not on file   Social History Narrative   Lives in [place].  Relocated to [place] a few years ago.    Family History  Problem Relation Age of Onset  . Diabetes Mother   . Hypertension Mother   . Kidney disease Mother   . Heart disease Mother     ROS: no fevers or chills, productive cough, hemoptysis, dysphasia, odynophagia, melena, hematochezia, dysuria, hematuria, rash, seizure activity, orthopnea, PND, pedal edema, claudication. Remaining systems are negative.  Physical Exam: Well-developed well-nourished in no acute distress.  Skin is warm and dry.  HEENT is normal.  Neck is supple.  Chest is clear to auscultation with normal expansion.  Cardiovascular exam is regular rate and rhythm.  Abdominal exam nontender or distended. No masses palpated. Extremities show no edema. neuro grossly intact  ECG

## 2016-03-04 ENCOUNTER — Encounter: Payer: Self-pay | Admitting: Cardiology

## 2016-03-04 ENCOUNTER — Ambulatory Visit (INDEPENDENT_AMBULATORY_CARE_PROVIDER_SITE_OTHER): Payer: Medicaid Other | Admitting: Cardiology

## 2016-03-04 VITALS — BP 140/72 | HR 70 | Ht 64.0 in | Wt 207.0 lb

## 2016-03-04 DIAGNOSIS — I2699 Other pulmonary embolism without acute cor pulmonale: Secondary | ICD-10-CM

## 2016-03-04 DIAGNOSIS — I10 Essential (primary) hypertension: Secondary | ICD-10-CM | POA: Diagnosis not present

## 2016-03-04 NOTE — Progress Notes (Signed)
      HPI: Follow-up hypertension and pulmonary embolus. Patient delivered by C-section on 11/25/2015. She was found to have a pulmonary embolus in October 2017 and there was note of DVT on lower extremity venous Dopplers. She is on norvasc for BP and xarelto. Echocardiogram December 2017 showed normal LV systolic function. Since last seen, there is no significant dyspnea, chest pain, palpitations or syncope.  Current Outpatient Prescriptions  Medication Sig Dispense Refill  . amLODipine (NORVASC) 10 MG tablet Take 1 tablet (10 mg total) by mouth daily. 30 tablet 5  . desonide (DESOWEN) 0.05 % ointment Apply 1 application topically 2 (two) times daily.    . ferrous sulfate 325 (65 FE) MG tablet Take 1 tablet by mouth 2 (two) times daily.  1  . oxyCODONE (OXY IR/ROXICODONE) 5 MG immediate release tablet Take 1 tablet (5 mg total) by mouth every 4 (four) hours as needed (pain scale 4-7). 30 tablet 0  . Prenatal Vit-Fe Fumarate-FA (PRENATAL MULTIVITAMIN) TABS tablet Take 1 tablet by mouth daily at 12 noon.    . rivaroxaban (XARELTO) 20 MG TABS tablet Take 1 tablet (20 mg total) by mouth daily with supper. 30 tablet 4   No current facility-administered medications for this visit.      Past Medical History:  Diagnosis Date  . Anemia   . Fibroid   . Hypertension     Past Surgical History:  Procedure Laterality Date  . CESAREAN SECTION N/A 04/04/2013   Procedure: CESAREAN SECTION;  Surgeon: Emily Filbert, MD;  Location: [facility];  Service: Obstetrics;  Laterality: N/A;  . CESAREAN SECTION N/A 11/25/2015   Procedure: CESAREAN SECTION;  Surgeon: Everett Graff, MD;  Location: [facility];  Service: Obstetrics;  Laterality: N/A;  . LAPAROSCOPIC CHOLECYSTECTOMY  2010   [place], [place]  . TONSILLECTOMY      Social History   Social History  . Marital status: Single    Spouse name: N/A  . Number of children: N/A  . Years of education: N/A   Occupational History  . Not on  file.   Social History Main Topics  . Smoking status: Never Smoker  . Smokeless tobacco: Never Used  . Alcohol use Yes     Comment: occassionally  . Drug use: No  . Sexual activity: Not Currently    Birth control/ protection: None   Other Topics Concern  . Not on file   Social History Narrative   Lives in [place].  Relocated to [place] a few years ago.    Family History  Problem Relation Age of Onset  . Diabetes Mother   . Hypertension Mother   . Kidney disease Mother   . Heart disease Mother     ROS: no fevers or chills, productive cough, hemoptysis, dysphasia, odynophagia, melena, hematochezia, dysuria, hematuria, rash, seizure activity, orthopnea, PND, pedal edema, claudication. Remaining systems are negative.  Physical Exam: Well-developed well-nourished in no acute distress.  Skin is warm and dry.  HEENT is normal.  Neck is supple.  Chest is clear to auscultation with normal expansion.  Cardiovascular exam is regular rate and rhythm.  Abdominal exam nontender or distended. No masses palpated. Extremities show no edema. neuro grossly intact  A/P  1 pulmonary embolus-we will continue xarelto for total of six months; this can be DC'd 06/14/15.   2 hypertension-she is now following her blood pressure at home and it is controlled. Continue amlodipine.  [name], MD

## 2016-03-04 NOTE — Patient Instructions (Addendum)
Your physician wants you to follow-up in: 3 Jolly will receive a reminder letter in the mail two months in advance. If you don't receive a letter, please call our office to schedule the follow-up appointment.    If you need a refill on your cardiac medications before your next appointment, please call your pharmacy.

## 2016-03-08 ENCOUNTER — Ambulatory Visit: Payer: Medicaid Other | Admitting: Cardiology

## 2016-03-09 ENCOUNTER — Encounter: Payer: Medicaid Other | Admitting: Oncology

## 2016-03-21 ENCOUNTER — Encounter: Payer: Self-pay | Admitting: Hematology

## 2016-03-21 ENCOUNTER — Other Ambulatory Visit: Payer: Medicaid Other

## 2016-03-21 ENCOUNTER — Ambulatory Visit (HOSPITAL_BASED_OUTPATIENT_CLINIC_OR_DEPARTMENT_OTHER): Payer: Medicaid Other | Admitting: Hematology

## 2016-03-21 ENCOUNTER — Telehealth: Payer: Self-pay | Admitting: Hematology

## 2016-03-21 VITALS — BP 144/79 | HR 91 | Temp 98.4°F | Resp 18 | Ht 64.0 in | Wt 209.0 lb

## 2016-03-21 DIAGNOSIS — I2699 Other pulmonary embolism without acute cor pulmonale: Secondary | ICD-10-CM | POA: Diagnosis present

## 2016-03-21 DIAGNOSIS — I82492 Acute embolism and thrombosis of other specified deep vein of left lower extremity: Secondary | ICD-10-CM | POA: Diagnosis not present

## 2016-03-21 DIAGNOSIS — Z7901 Long term (current) use of anticoagulants: Secondary | ICD-10-CM

## 2016-03-21 DIAGNOSIS — D649 Anemia, unspecified: Secondary | ICD-10-CM | POA: Diagnosis not present

## 2016-03-21 NOTE — Telephone Encounter (Signed)
Appointment scheduled per 1/22 LOS. Patient did not go to scheduled lab on 1/22 per she was told to wait to have her labs after her follow up visit. The lab was closed by the time the patient made it to scheduling therefore her lab appointment has been scheduled for 1/23 in the late afternoon.

## 2016-03-21 NOTE — Progress Notes (Signed)
[facility]    HEMATOLOGY/ONCOLOGY CONSULTATION NOTE  Date of Service: 03/21/2016  Patient Care Team: Lin Landsman, MD as PCP - General (Family Medicine) Frederico Hamman, MD (Inactive) as Consulting Physician (Obstetrics and Gynecology)  CHIEF COMPLAINTS/PURPOSE OF CONSULTATION:  Rt sided PE and left LE DVT  HISTORY OF PRESENTING ILLNESS:  Brittney Sandoval is a wonderful 36 y.o. female who has been referred to [place] by Dr .Kristine Garbe, MD  for evaluation and management of acute PE and DVT.  Patient has a h/o HTN, anemia and fibroids who delivered a child by C-section on 11/25/2015. Patient reports having had significant post partum bleeding needing 2 units of PRBC.  She was discharged and presented to the ED on 12/14/2015 with SOB, DOE and chest discomfort and had a CTA chest which showed RLL and RML PE and [place] ext on 12/17/15 which showed left peroneal DVT.  Patient was started on lovenox and was discharged on lovenox and then switched to Xarelto in about 4 weeks. She notes that her CP and SOB have resolved. Notes some intermitent chest tightness which is non exertional. Has been started on Norvasc and notes some increased b/l leg swelling and is concerned regarding clot progression.  Notes that she has remained 100% compliant with her Xarelto with no missed doses. She is stressed being the lone parent taking care of 5 children and notes some issues with anxiety.  Has no FHx of blood clots and no DVT/PE prior to this VTE triggered by her recent c-section/post-partum state.   MEDICAL HISTORY:  Past Medical History:  Diagnosis Date  . Anemia   . Fibroid   . Hypertension     SURGICAL HISTORY: Past Surgical History:  Procedure Laterality Date  . CESAREAN SECTION N/A 04/04/2013   Procedure: CESAREAN SECTION;  Surgeon: Emily Filbert, MD;  Location: [facility];  Service: Obstetrics;  Laterality: N/A;  . CESAREAN SECTION N/A 11/25/2015   Procedure: CESAREAN SECTION;  Surgeon: Everett Graff, MD;   Location: [facility];  Service: Obstetrics;  Laterality: N/A;  . LAPAROSCOPIC CHOLECYSTECTOMY  2010   [place], [place]  . TONSILLECTOMY      SOCIAL HISTORY: Social History   Social History  . Marital status: Single    Spouse name: N/A  . Number of children: N/A  . Years of education: N/A   Occupational History  . Not on file.   Social History Main Topics  . Smoking status: Never Smoker  . Smokeless tobacco: Never Used  . Alcohol use Yes     Comment: occassionally  . Drug use: No  . Sexual activity: Not Currently    Birth control/ protection: None   Other Topics Concern  . Not on file   Social History Narrative   Lives in [place].  Relocated to [place] a few years ago.    FAMILY HISTORY: Family History  Problem Relation Age of Onset  . Diabetes Mother   . Hypertension Mother   . Kidney disease Mother   . Heart disease Mother     ALLERGIES:  has No Known Allergies.  MEDICATIONS:  Current Outpatient Prescriptions  Medication Sig Dispense Refill  . amLODipine (NORVASC) 10 MG tablet Take 1 tablet (10 mg total) by mouth daily. 30 tablet 5  . desonide (DESOWEN) 0.05 % ointment Apply 1 application topically 2 (two) times daily.    . ferrous sulfate 325 (65 FE) MG tablet Take 1 tablet by mouth 2 (two) times daily.  1  . oxyCODONE (  OXY IR/ROXICODONE) 5 MG immediate release tablet Take 1 tablet (5 mg total) by mouth every 4 (four) hours as needed (pain scale 4-7). 30 tablet 0  . Prenatal Vit-Fe Fumarate-FA (PRENATAL MULTIVITAMIN) TABS tablet Take 1 tablet by mouth daily at 12 noon.    . rivaroxaban (XARELTO) 20 MG TABS tablet Take 1 tablet (20 mg total) by mouth daily with supper. 30 tablet 4   No current facility-administered medications for this visit.     REVIEW OF SYSTEMS:    10 Point review of Systems was done is negative except as noted above.  PHYSICAL EXAMINATION: ECOG PERFORMANCE STATUS: 1 - Symptomatic but completely  ambulatory  . Vitals:   03/21/16 1536  BP: (!) 144/79  Pulse: 91  Resp: 18  Temp: 98.4 F (36.9 C)   Filed Weights   03/21/16 1536  Weight: 209 lb (94.8 kg)   .Body mass index is 35.87 kg/m.  GENERAL:alert, in no acute distress and comfortable SKIN: skin color, texture, turgor are normal, no rashes or significant lesions EYES: normal, conjunctiva are pink and non-injected, sclera clear OROPHARYNX:no exudate, no erythema and lips, buccal mucosa, and tongue normal  NECK: supple, no JVD, thyroid normal size, non-tender, without nodularity LYMPH:  no palpable lymphadenopathy in the cervical, axillary or inguinal LUNGS: clear to auscultation with normal respiratory effort HEART: regular rate & rhythm,  no murmurs and no lower extremity edema ABDOMEN: abdomen soft, non-tender, normoactive bowel sounds  Musculoskeletal: b/l 1+pedal edema PSYCH: alert & oriented x 3 with fluent speech NEURO: no focal motor/sensory deficits  LABORATORY DATA:  I have reviewed the data as listed  . CBC Latest Ref Rng & Units 12/14/2015 11/29/2015 11/28/2015  WBC 4.0 - 10.5 K/uL 8.0 10.0 12.3(H)  Hemoglobin 12.0 - 15.0 g/dL 11.1(L) 8.6(L) 6.3(LL)  Hematocrit 36.0 - 46.0 % 34.5(L) 25.8(L) 18.8(L)  Platelets 150 - 400 K/uL 368 232 224    . CMP Latest Ref Rng & Units 12/14/2015 11/25/2014 09/30/2014  Glucose 65 - 99 mg/dL 87 100(H) 96  BUN 6 - 20 mg/dL 8 16 12   Creatinine 0.44 - 1.00 mg/dL 0.88 0.78 0.80  Sodium 135 - 145 mmol/L 140 134(L) 138  Potassium 3.5 - 5.1 mmol/L 4.4 3.7 4.0  Chloride 101 - 111 mmol/L 108 103 108  CO2 22 - 32 mmol/L 26 28 25   Calcium 8.9 - 10.3 mg/dL 9.6 9.5 9.6  Total Protein 6.5 - 8.1 g/dL 7.2 7.9 8.5(H)  Total Bilirubin 0.3 - 1.2 mg/dL 0.4 0.2(L) 0.4  Alkaline Phos 38 - 126 U/L 75 58 59  AST 15 - 41 U/L 23 21 16   ALT 14 - 54 U/L 24 20 16      RADIOGRAPHIC STUDIES: I have personally reviewed the radiological images as listed and agreed with the findings in the  report. No results found.  ASSESSMENT & PLAN:  36 yo AAF with   1) Acute Rt sided Pulmonary embolism and Left LE DVT on 12/14/2015. Triggered by C-section and post partum state. Patient has completed 3 months of anticoagulation and is currently on Xarelto and tolerating It well. Plan -will rpt US venous b/l LE since patient is anxious and some b/l LE swelling(likely from Norvasc) and some left calf cramping. To r/o clot progression. -we discussed that if she is compliant with her Xarelto there is a very low rate of failure of anticoagulation. -given that this was a clearly provoked event would not recommend additional evaluation for hereditary or other acquired factors causing thrombophilia. -  would recommend 6 months of anticoagulation. After this the patient might choose to be on baby ASA 81mg  po daily for an additional 6 months. -would need lovenox prophylaxis with subsequent pregnancies given h/o hormonally triggered VTE. - discussed with patient to consider using compression socks to help with leg swelling.  2) h/o Acute blood loss anemia due to significant PPH -will recheck labs including cbc, cmp and iron profile -continue po iron at this time  . Patient Active Problem List   Diagnosis Date Noted  . Uncontrolled hypertension 01/18/2016  . Acute pulmonary embolism ([employer]) 12/15/2015  . Anemia 11/27/2015  . S/P repeat low transverse C-section 11/26/2015  . Advanced maternal age in multigravida   . Sickle cell trait ([place]) 12/03/2012  . Benign essential hypertension, antepartum 12/03/2012  . 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 Obesity (BMI 30-39.9) 10/24/2012   -continue f/u with PCP   Kindly reconsult [place] if any new questions or concerns arise.  All of the patients questions were answered with apparent satisfaction. The patient knows to call the clinic with any problems, questions or concerns.  I spent 45 minutes  counseling the patient face to face. The total time spent in the appointment was 60 minutes and more than 50% was on counseling and direct patient cares.    Sullivan Lone MD [place] AAHIVMS [facility] Hematology/Oncology Physician [facility]  (Office):       [phone] (Work cell):  [phone] (Fax):           [phone]  03/21/2016 3:40 PM

## 2016-03-22 ENCOUNTER — Other Ambulatory Visit: Payer: Medicaid Other

## 2016-03-29 ENCOUNTER — Ambulatory Visit (HOSPITAL_BASED_OUTPATIENT_CLINIC_OR_DEPARTMENT_OTHER): Payer: Medicaid Other

## 2016-03-29 ENCOUNTER — Ambulatory Visit (HOSPITAL_COMMUNITY)
Admission: RE | Admit: 2016-03-29 | Discharge: 2016-03-29 | Disposition: A | Discharge status: Home / Self Care | Payer: Medicaid Other | Source: Ambulatory Visit | Attending: Hematology | Admitting: Hematology

## 2016-03-29 DIAGNOSIS — I2699 Other pulmonary embolism without acute cor pulmonale: Secondary | ICD-10-CM

## 2016-03-29 DIAGNOSIS — I82492 Acute embolism and thrombosis of other specified deep vein of left lower extremity: Secondary | ICD-10-CM | POA: Insufficient documentation

## 2016-03-29 DIAGNOSIS — D649 Anemia, unspecified: Secondary | ICD-10-CM

## 2016-03-29 LAB — CBC & DIFF AND RETIC
BASO%: 0.5 % (ref 0.0–2.0)
Basophils Absolute: 0 10*3/uL (ref 0.0–0.1)
EOS ABS: 0.2 10*3/uL (ref 0.0–0.5)
EOS%: 3.2 % (ref 0.0–7.0)
HCT: 34.1 % — ABNORMAL LOW (ref 34.8–46.6)
HGB: 10.9 g/dL — ABNORMAL LOW (ref 11.6–15.9)
IMMATURE RETIC FRACT: 5.8 % (ref 1.60–10.00)
LYMPH#: 2.6 10*3/uL (ref 0.9–3.3)
LYMPH%: 43.8 % (ref 14.0–49.7)
MCH: 21.4 pg — ABNORMAL LOW (ref 25.1–34.0)
MCHC: 32 g/dL (ref 31.5–36.0)
MCV: 66.9 fL — ABNORMAL LOW (ref 79.5–101.0)
MONO#: 0.4 10*3/uL (ref 0.1–0.9)
MONO%: 7.5 % (ref 0.0–14.0)
NEUT%: 45 % (ref 38.4–76.8)
NEUTROS ABS: 2.6 10*3/uL (ref 1.5–6.5)
Platelets: 339 10*3/uL (ref 145–400)
RBC: 5.1 10*6/uL (ref 3.70–5.45)
RDW: 16 % — ABNORMAL HIGH (ref 11.2–14.5)
RETIC %: 1.86 % (ref 0.70–2.10)
RETIC CT ABS: 94.86 10*3/uL — AB (ref 33.70–90.70)
WBC: 5.9 10*3/uL (ref 3.9–10.3)
nRBC: 0 % (ref 0–0)

## 2016-03-29 LAB — COMPREHENSIVE METABOLIC PANEL
ALT: 19 U/L (ref 0–55)
AST: 14 U/L (ref 5–34)
Albumin: 3.9 g/dL (ref 3.5–5.0)
Alkaline Phosphatase: 85 U/L (ref 40–150)
Anion Gap: 8 mEq/L (ref 3–11)
BUN: 9.3 mg/dL (ref 7.0–26.0)
CHLORIDE: 106 meq/L (ref 98–109)
CO2: 26 meq/L (ref 22–29)
CREATININE: 0.8 mg/dL (ref 0.6–1.1)
Calcium: 10.3 mg/dL (ref 8.4–10.4)
EGFR: >90 mL/min/{1.73_m2} (ref >90)
GLUCOSE: 95 mg/dL (ref 70–140)
Potassium: 4.4 mEq/L (ref 3.5–5.1)
Sodium: 140 mEq/L (ref 136–145)
TOTAL PROTEIN: 7.9 g/dL (ref 6.4–8.3)
Total Bilirubin: <0.22 mg/dL (ref 0.20–1.20)

## 2016-03-29 NOTE — Progress Notes (Signed)
**  Preliminary report by tech**  Bilateral lower extremity venous duplex completed. There is no evidence of deep or superficial vein thrombosis involving the right and left lower extremities. All visualized vessels appear patent and compressible. There is no evidence of Baker's cysts bilaterally. Results were given to [place] at [phone].  03/29/16 3:28 PM Carlos Levering RVT

## 2016-03-30 LAB — IRON AND TIBC
%SAT: 28 % (ref 21–57)
IRON: 89 ug/dL (ref 41–142)
TIBC: 314 ug/dL (ref 236–444)
UIBC: 224 ug/dL (ref 120–384)

## 2016-03-30 LAB — FERRITIN: FERRITIN: 25 ng/mL (ref 9–269)

## 2016-04-06 ENCOUNTER — Telehealth: Payer: Self-pay | Admitting: Cardiology

## 2016-04-06 NOTE — Telephone Encounter (Signed)
New message   Offer appt with APP on 2.8 pt unable needs afternoon appt.  Offer appt with MD on 2.12 child has appt in [facility].    Pt C/O swelling: STAT is pt has developed SOB within 24 hours  1. How long have you been experiencing swelling? A week in half / comes in goes   2. Where is the swelling located? Mainly in feet's/ legs   3.  Are you currently taking a "fluid pill"? No , amLODipine (NORVASC) 10 MG tablet  4.  Are you currently SOB? No   5.  Have you traveled recently? No

## 2016-04-06 NOTE — Telephone Encounter (Signed)
Spoke with patient Concerned with swelling in legs and ankles comes /goes Patient states she awake each morning- with no swelling.  patient has a somewhat sedentary job. No chest pain,  Patient states she has some discomfort similar to when PE detected- but "if she jumps up to be active" she does not think she has anything when sitting. Offered earlier appointment- only able to come 04/14/16  continue to monitor blood pressure , weight daily,keep taking amlodipine 10 mg Keep appointment verbalized understanding

## 2016-04-14 ENCOUNTER — Ambulatory Visit: Payer: Medicaid Other | Admitting: Nurse Practitioner

## 2016-04-21 ENCOUNTER — Encounter: Payer: Self-pay | Admitting: Nurse Practitioner

## 2016-04-21 ENCOUNTER — Ambulatory Visit (INDEPENDENT_AMBULATORY_CARE_PROVIDER_SITE_OTHER): Payer: Medicaid Other | Admitting: Nurse Practitioner

## 2016-04-21 VITALS — BP 136/86 | HR 83 | Ht 64.0 in | Wt 206.8 lb

## 2016-04-21 DIAGNOSIS — I10 Essential (primary) hypertension: Secondary | ICD-10-CM

## 2016-04-21 DIAGNOSIS — Z86718 Personal history of other venous thrombosis and embolism: Secondary | ICD-10-CM

## 2016-04-21 DIAGNOSIS — Z86711 Personal history of pulmonary embolism: Secondary | ICD-10-CM

## 2016-04-21 DIAGNOSIS — R600 Localized edema: Secondary | ICD-10-CM

## 2016-04-21 MED ORDER — CHLORTHALIDONE 25 MG PO TABS: 25.0000 mg | ORAL_TABLET | Freq: Every day | ORAL | 3 refills | Status: DC

## 2016-04-21 NOTE — Progress Notes (Signed)
Office Visit    Patient Name: Brittney Sandoval Date of Encounter: 04/21/2016  Primary Care Provider:  Kristine Garbe, MD Primary Cardiologist:  B. Stanford Breed, MD   Chief Complaint    36 year old female with a history of hypertension, DVT and PE on xarelto, who presents for follow-up related to ongoing hypertension and mild lower extremity swelling.  Past Medical History    Past Medical History:  Diagnosis Date  . Anemia   . DVT (deep venous thrombosis) ([place])    a. 11/2015 U/S: Left Peroneal DVT;  b. 02/2016 U/S: No DVT bilat.  . Essential hypertension    a. 01/2016 Echo: EF 60-65%, no rwma, triv MR/TR, PASP 14mmHg.  . Fibroid   . Pulmonary emboli ([place])    a. 11/2015 CTA Chest: PE involving the segmental and subsegmental branches of the RML & RLL-->Xarelto.   Past Surgical History:  Procedure Laterality Date  . CESAREAN SECTION N/A 04/04/2013   Procedure: CESAREAN SECTION;  Surgeon: Emily Filbert, MD;  Location: [facility];  Service: Obstetrics;  Laterality: N/A;  . CESAREAN SECTION N/A 11/25/2015   Procedure: CESAREAN SECTION;  Surgeon: Everett Graff, MD;  Location: [facility];  Service: Obstetrics;  Laterality: N/A;  . LAPAROSCOPIC CHOLECYSTECTOMY  2010   [place], [place]  . TONSILLECTOMY      Allergies  No Known Allergies  History of Present Illness    36 year old female with the above past medical history including hypertension, left lower extremity DVT and right-sided pulmonary emboli in October 2017, on Xarelto since. DVT and PE occurred approximately one month after delivery of her second child. Over the past few years, her blood pressure regimen has varied and she has previously been treated with hydrochlorothiazide, diltiazem, clonidine, and lisinopril. She was switched to amlodipine in the fall of 2017. This was subsequently titrated to 10 mg daily in December 2017 and her pressure was in the 140s in early January. She says that at home, pressures are  typically in the 150s but if she repeats it several times, it'll come down into the upper 130s and 140s. She works as a Automotive engineer, and has also recently been experiencing mild dependent lower extremity swelling of her ankles and feet. This is somewhat bothersome. In that setting, she would like to come off of amlodipine if possible. She is no longer breast-feeding. She denies chest pain, dyspnea, PND, orthopnea, dizziness, syncope, or early satiety.  Home Medications    Prior to Admission medications   Medication Sig Start Date End Date Taking? Authorizing Provider  desonide (DESOWEN) 0.05 % ointment Apply 1 application topically 2 (two) times daily.   Yes Historical Provider, MD  ferrous sulfate 325 (65 FE) MG tablet Take 1 tablet by mouth 2 (two) times daily. 10/28/15  Yes Historical Provider, MD  oxyCODONE (OXY IR/ROXICODONE) 5 MG immediate release tablet Take 1 tablet (5 mg total) by mouth every 4 (four) hours as needed (pain scale 4-7). 11/29/15  Yes Naima Dillard, MD  Prenatal Vit-Fe Phos-FA-Omega (VITAFOL GUMMIES) 3.33-0.333-34.8 MG CHEW 3 (three) times daily. 03/05/16  Yes Historical Provider, MD  rivaroxaban (XARELTO) 20 MG TABS tablet Take 1 tablet (20 mg total) by mouth daily with supper. 01/18/16  Yes Luke K Kilroy, PA-C  chlorthalidone (HYGROTON) 25 MG tablet Take 1 tablet (25 mg total) by mouth daily. 04/21/16 07/20/16  Rogelia Mire, NP    Review of Systems    She has noted elevated blood pressures with mild lower extremity  swelling.  She denies chest pain, palpitations, dyspnea, pnd, orthopnea, n, v, dizziness, syncope, weight gain, or early satiety.  All other systems reviewed and are otherwise negative except as noted above.  Physical Exam    VS: Blood pressure initially 150/88, repeat- BP 136/86   Pulse 83   Ht 5\' 4"  (1.626 m)   Wt 206 lb 12.8 oz (93.8 kg)   BMI 35.50 kg/m  , BMI Body mass index is 35.5 kg/m. GEN: Well nourished, well developed, in no  acute distress.  HEENT: normal.  Neck: Supple, no JVD, carotid bruits, or masses. Cardiac: RRR, Soft systolic flow murmur at the upper sternal border, no rubs, or gallops. No clubbing, cyanosis, edema.  Radials/DP/PT 2+ and equal bilaterally.  Respiratory:  Respirations regular and unlabored, clear to auscultation bilaterally. GI: Soft, nontender, nondistended, BS + x 4. MS: no deformity or atrophy. Skin: warm and dry, no rash. Neuro:  Strength and sensation are intact. Psych: Normal affect.  Accessory Clinical Findings    ECG - Regular sinus rhythm, 83, no acute ST or T changes.  Assessment & Plan    1.   Essential hypertension: Patient has a history of hypertension and has been tried on several medications. She has been on amlodipine since the fall of 2017 and has had moderate success with keeping her blood pressure under control. Pressures recently, have been typically in the 150s at home though they do seem to improve she repeats her blood pressure several times. I did repeat her pressure in clinic today as it was initially 150/88, and it came down to 136/86. We had a long discussion about options with regards to blood pressure medications, she has been having mild dependent lower extremity swelling involving her ankles and feet at the end of her workday she is concerned that amlodipine may be playing a role in this. She would like to try coming off of amlodipine and as such, I will prescribe chlorthalidone 25 mg daily. I will arrange for follow-up basic metabolic panel in 1 week. She will continue to follow her blood pressure at home and contact us within the week if pressures remain over [id number] systolic. If so, I would have a low threshold to add back a lower dose of amlodipine since she has tolerated this in the past.  2. History of PE and DVT: She is followed by hematology and remains compliant with Xarelto. Recent lower extremity ultrasound in January showed no further evidence of  DVT.  3. Lower extremity swelling: See #1. She has been having some dependent mild ankle and foot edema after being on her feet all day. She would like to try coming off of amlodipine to see if this makes a difference and as such, I'm changing her to chlorthalidone.  4. Disposition: Follow-up basic metabolic panel in 1 week. She will contact us if blood pressures remain elevated at which point, we can add back a lower dose of amlodipine. She has follow-up with Dr. Stanford Breed in April.  Murray Hodgkins, NP 04/21/2016, 4:43 PM

## 2016-04-21 NOTE — Patient Instructions (Signed)
Medication Instructions:   STOP AMLODIPINE  START CHLORTHALIDONE 25 MG ONCE DIALY  Labwork:  Your physician recommends that you return for lab work in: [place]:  Your physician recommends that you schedule a follow-up appointment : WITH DR Stanford Breed AS SCHEDULED

## 2016-04-25 NOTE — Addendum Note (Signed)
Addended by: Ulice Brilliant T on: 04/25/2016 02:04 PM   Modules accepted: Orders

## 2016-05-24 NOTE — Progress Notes (Signed)
HPI: Follow-up hypertension and pulmonary embolus. Patient delivered by C-section on 11/25/2015. She was found to have a pulmonary embolus in October 2017 and there was note of DVT on lower extremity venous Dopplers. She was treated with xarelto. Echocardiogram December 2017 showed normal LV systolic function. Lower extremity Dopplers January 2018 showed no DVT. Since last seen, there is no dyspnea, chest pain, palpitations or syncope Her pedal edema has resolved.  Current Outpatient Prescriptions  Medication Sig Dispense Refill  . amLODipine (NORVASC) 10 MG tablet Take 10 mg by mouth daily.    Marland Kitchen desonide (DESOWEN) 0.05 % ointment Apply 1 application topically 2 (two) times daily.    . ferrous sulfate 325 (65 FE) MG tablet Take 1 tablet by mouth 2 (two) times daily.  1  . oxyCODONE (OXY IR/ROXICODONE) 5 MG immediate release tablet Take 1 tablet (5 mg total) by mouth every 4 (four) hours as needed (pain scale 4-7). 30 tablet 0  . Prenatal Vit-Fe Phos-FA-Omega (VITAFOL GUMMIES) 3.33-0.333-34.8 MG CHEW 3 (three) times daily.  11  . rivaroxaban (XARELTO) 20 MG TABS tablet Take 1 tablet (20 mg total) by mouth daily with supper. 30 tablet 4   No current facility-administered medications for this visit.      Past Medical History:  Diagnosis Date  . Anemia   . DVT (deep venous thrombosis) ([place])    a. 11/2015 U/S: Left Peroneal DVT;  b. 02/2016 U/S: No DVT bilat.  . Essential hypertension    a. 01/2016 Echo: EF 60-65%, no rwma, triv MR/TR, PASP 6mmHg.  . Fibroid   . Pulmonary emboli ([place])    a. 11/2015 CTA Chest: PE involving the segmental and subsegmental branches of the RML & RLL-->Xarelto.    Past Surgical History:  Procedure Laterality Date  . CESAREAN SECTION N/A 04/04/2013   Procedure: CESAREAN SECTION;  Surgeon: Emily Filbert, MD;  Location: [facility];  Service: Obstetrics;  Laterality: N/A;  . CESAREAN SECTION N/A 11/25/2015   Procedure: CESAREAN SECTION;  Surgeon: Everett Graff,  MD;  Location: [facility];  Service: Obstetrics;  Laterality: N/A;  . LAPAROSCOPIC CHOLECYSTECTOMY  2010   [place], [place]  . TONSILLECTOMY      Social History   Social History  . Marital status: Single    Spouse name: N/A  . Number of children: N/A  . Years of education: N/A   Occupational History  . Not on file.   Social History Main Topics  . Smoking status: Never Smoker  . Smokeless tobacco: Never Used  . Alcohol use Yes     Comment: occassionally  . Drug use: No  . Sexual activity: Not Currently    Birth control/ protection: None   Other Topics Concern  . Not on file   Social History Narrative   Lives in [place].  Relocated to [place] a few years ago.    Family History  Problem Relation Age of Onset  . Diabetes Mother   . Hypertension Mother   . Kidney disease Mother   . Heart disease Mother     ROS: Some back and left knee pain but no fevers or chills, productive cough, hemoptysis, dysphasia, odynophagia, melena, hematochezia, dysuria, hematuria, rash, seizure activity, orthopnea, PND, pedal edema, claudication. Remaining systems are negative.  Physical Exam: Well-developed well-nourished in no acute distress.  Skin is warm and dry.  HEENT is normal.  Neck is supple.  Chest is clear to auscultation with normal expansion.  Cardiovascular exam is  regular rate and rhythm. 2/6 SEM Abdominal exam nontender or distended. No masses palpated. Extremities show no edema. neuro grossly intact  A/P  1 Pulmonary embolus-continue xarelto for 6 total months following pulmonary embolus and then discontinue (DC 06/14/16). Followed by hematology.  2 Hypertension-blood pressure is reasonably well controlled and she states systolic runs between 524 and 135 at home. We will follow and adjust regimen as needed. At previous office visit there was thought of discontinuing the amlodipine because of pedal edema and beginning chlorthalidone. However  that medicine was too expensive and her edema has now resolved. Continue amlodipine.  3 pedal edema-resolved. Continue amlodipine for now.  [name], MD

## 2016-05-25 ENCOUNTER — Encounter: Payer: Self-pay | Admitting: Cardiology

## 2016-06-06 ENCOUNTER — Encounter: Payer: Self-pay | Admitting: Cardiology

## 2016-06-06 ENCOUNTER — Ambulatory Visit (INDEPENDENT_AMBULATORY_CARE_PROVIDER_SITE_OTHER): Payer: Medicaid Other | Admitting: Cardiology

## 2016-06-06 VITALS — BP 136/84 | HR 80 | Ht 64.0 in | Wt 210.0 lb

## 2016-06-06 DIAGNOSIS — I2699 Other pulmonary embolism without acute cor pulmonale: Secondary | ICD-10-CM | POA: Diagnosis not present

## 2016-06-06 DIAGNOSIS — I10 Essential (primary) hypertension: Secondary | ICD-10-CM

## 2016-06-06 DIAGNOSIS — R600 Localized edema: Secondary | ICD-10-CM

## 2016-06-06 NOTE — Patient Instructions (Signed)
Medication Instructions:   Brittney Sandoval 06-14-16  Follow-Up:  Your physician wants you to follow-up in: [place] will receive a reminder letter in the mail two months in advance. If you don't receive a letter, please call our office to schedule the follow-up appointment.   If you need a refill on your cardiac medications before your next appointment, please call your pharmacy.

## 2016-06-07 ENCOUNTER — Telehealth: Payer: Self-pay | Admitting: *Deleted

## 2016-06-07 NOTE — Telephone Encounter (Signed)
Received fax from [facility] asking for letter of medical clearance regarding stopping Xarelto prior to upcoming surgical procedure.  Per last office and per Dr. Irene Limbo, pt was instructed to follow up with PCP regarding continuation of blood thinner.  Pt last seen in January, and has no scheduled follow up with Dr. Irene Limbo.  This information was reviewed with Vincente Liberty at [facility]  This RN then received phone call from pt stating she has not been back to PCP regarding use of blood thinners.  Pt asking for general recommendation from MD.  Explained to pt that [facility] is asking for a written letter of medical clearance, which Dr. Irene Limbo can not give at this time considering we have not seen the pt and she was signed off to PCP.  Pt verbalized understanding.

## 2016-06-10 ENCOUNTER — Telehealth: Payer: Self-pay | Admitting: *Deleted

## 2016-06-10 NOTE — Telephone Encounter (Signed)
Will fax this note to the number provided. 

## 2016-06-10 NOTE — Telephone Encounter (Signed)
Ok  For surgery; needs DVT prophylaxis post op given h/o PE last fall. [name]

## 2016-06-10 NOTE — Telephone Encounter (Signed)
Patient is needing clearance for hysterectomy D&C. Will forward for dr Stanford Breed review

## 2016-06-11 ENCOUNTER — Emergency Department (HOSPITAL_COMMUNITY)
Admission: EM | Admit: 2016-06-11 | Discharge: 2016-06-11 | Disposition: A | Discharge status: Home / Self Care | Payer: Medicaid Other | Attending: Emergency Medicine | Admitting: Emergency Medicine

## 2016-06-11 ENCOUNTER — Encounter (HOSPITAL_COMMUNITY): Payer: Self-pay | Admitting: Emergency Medicine

## 2016-06-11 DIAGNOSIS — Z79899 Other long term (current) drug therapy: Secondary | ICD-10-CM | POA: Diagnosis not present

## 2016-06-11 DIAGNOSIS — Y999 Unspecified external cause status: Secondary | ICD-10-CM | POA: Diagnosis not present

## 2016-06-11 DIAGNOSIS — X501XXA Overexertion from prolonged static or awkward postures, initial encounter: Secondary | ICD-10-CM | POA: Insufficient documentation

## 2016-06-11 DIAGNOSIS — Y939 Activity, unspecified: Secondary | ICD-10-CM | POA: Insufficient documentation

## 2016-06-11 DIAGNOSIS — S40022A Contusion of left upper arm, initial encounter: Secondary | ICD-10-CM | POA: Diagnosis present

## 2016-06-11 DIAGNOSIS — I10 Essential (primary) hypertension: Secondary | ICD-10-CM | POA: Diagnosis not present

## 2016-06-11 DIAGNOSIS — Y929 Unspecified place or not applicable: Secondary | ICD-10-CM | POA: Insufficient documentation

## 2016-06-11 LAB — PROTIME-INR
INR: 1.45
Prothrombin Time: 17.7 seconds — ABNORMAL HIGH (ref 11.4–15.2)

## 2016-06-11 NOTE — ED Provider Notes (Signed)
[facility] DEPT Provider Note   CSN: [id number] Arrival date & time: 06/11/16  0175  By signing my name below, I, Dolores Hoose, attest that this documentation has been prepared under the direction and in the presence of non-physician practitioner, Flonnie Overman, [place]. Electronically Signed: Dolores Hoose, Scribe. 06/11/2016. 7:58 PM.  History   Chief Complaint Chief Complaint  Patient presents with  . Arm Pain   The history is provided by the patient. No language interpreter was used.    HPI Comments:  Brittney Sandoval is a 36 y.o. female who presents to the Emergency Department complaining of sudden-onset, unchanged hematoma to her left arm that she noticed today. She states that this morning she was moving some furniture but does not remember hitting her arm directly. Pt states that she is taking Xarelto for a PE and that this may be related to her bruise. Pt reports associated left arm pain only on palpation. She reports associated arm soreness. Denies any SOB, chest tightness, CP, hemoptysis, hematuria, fevers, chills, nausea, vomiting, diarrhea or cough. She also denies any recent travel or birth control.   Past Medical History:  Diagnosis Date  . Anemia   . DVT (deep venous thrombosis) ([place])    a. 11/2015 U/S: Left Peroneal DVT;  b. 02/2016 U/S: No DVT bilat.  . Essential hypertension    a. 01/2016 Echo: EF 60-65%, no rwma, triv MR/TR, PASP 38mmHg.  . Fibroid   . Pulmonary emboli ([place])    a. 11/2015 CTA Chest: PE involving the segmental and subsegmental branches of the RML & RLL-->Xarelto.    Patient Active Problem List   Diagnosis Date Noted  . Uncontrolled hypertension 01/18/2016  . Acute pulmonary embolism ([place]) 12/15/2015  . Anemia 11/27/2015  . S/P repeat low transverse C-section 11/26/2015  . Advanced maternal age in multigravida   . Sickle cell trait ([place]) 12/03/2012  . Benign essential hypertension, antepartum 12/03/2012  . 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 Obesity (BMI 30-39.9) 10/24/2012    Past Surgical History:  Procedure Laterality Date  . CESAREAN SECTION N/A 04/04/2013   Procedure: CESAREAN SECTION;  Surgeon: Emily Filbert, MD;  Location: [facility];  Service: Obstetrics;  Laterality: N/A;  . CESAREAN SECTION N/A 11/25/2015   Procedure: CESAREAN SECTION;  Surgeon: Everett Graff, MD;  Location: [facility];  Service: Obstetrics;  Laterality: N/A;  . LAPAROSCOPIC CHOLECYSTECTOMY  2010   [place], [place]  . TONSILLECTOMY      OB History    Gravida Para Term Preterm AB Living   5 5 5     5    SAB TAB Ectopic Multiple Live Births         0 5       Home Medications    Prior to Admission medications   Medication Sig Start Date End Date Taking? Authorizing Provider  amLODipine (NORVASC) 10 MG tablet Take 10 mg by mouth daily.    Historical Provider, MD  desonide (DESOWEN) 0.05 % ointment Apply 1 application topically 2 (two) times daily.    Historical Provider, MD  ferrous sulfate 325 (65 FE) MG tablet Take 1 tablet by mouth 2 (two) times daily. 10/28/15   Historical Provider, MD  oxyCODONE (OXY IR/ROXICODONE) 5 MG immediate release tablet Take 1 tablet (5 mg total) by mouth every 4 (four) hours as needed (pain scale 4-7). 11/29/15   Crawford Givens, MD  Prenatal Vit-Fe Phos-FA-Omega (VITAFOL GUMMIES) 3.33-0.333-34.8 MG CHEW 3 (  three) times daily. 03/05/16   Historical Provider, MD    Family History Family History  Problem Relation Age of Onset  . Diabetes Mother   . Hypertension Mother   . Kidney disease Mother   . Heart disease Mother     Social History Social History  Substance Use Topics  . Smoking status: Never Smoker  . Smokeless tobacco: Never Used  . Alcohol use Yes     Comment: occassionally     Allergies   Patient has no known allergies.   Review of Systems Review of Systems  Constitutional: Negative for chills and fever.    Respiratory: Negative for cough, chest tightness and shortness of breath.   Gastrointestinal: Negative for diarrhea, nausea and vomiting.  Genitourinary: Negative for hematuria.  Musculoskeletal: Positive for myalgias.  Skin: Positive for color change and wound.     Physical Exam Updated Vital Signs BP (!) 135/91 (BP Location: Right Arm)   Pulse 76   Temp 98.5 F (36.9 C) (Oral)   Resp 18   Ht 5\' 4"  (1.626 m)   Wt 96.6 kg   LMP 06/11/2016   SpO2 98%   BMI 36.57 kg/m   Physical Exam  Constitutional: She appears well-developed and well-nourished.  Well appearing  HENT:  Head: Normocephalic and atraumatic.  Nose: Nose normal.  Eyes: Conjunctivae and EOM are normal.  Neck: Normal range of motion. No JVD present. No tracheal deviation present.  Cardiovascular: Normal rate, regular rhythm, normal heart sounds and intact distal pulses.   No murmur heard. 2+ DP  Pulmonary/Chest: Effort normal and breath sounds normal. No stridor. No respiratory distress. She has no wheezes. She has no rales.  Normal work of breathing. No respiratory distress noted.   Abdominal: Soft.  Musculoskeletal: Normal range of motion.  Good Range of motion of upper arms bilaterally.  Neurological: She is alert.  Sensation intact to BUE. Muscle strength 5/5 and intact to BUE and distal to affected site.   Skin: Skin is warm. Capillary refill takes less than 2 seconds.  3x2cm bruise to upper anterior arm over mid bicep. Minimally tender to palpation. No surrounding erythema. Skin is intact.  Psychiatric: She has a normal mood and affect. Her behavior is normal.  Nursing note and vitals reviewed.  ED Treatments / Results  DIAGNOSTIC STUDIES:  Oxygen Saturation is 98% on RA, normal by my interpretation.    COORDINATION OF CARE:  10:57 PM Discussed treatment plan with pt at bedside and pt agreed to plan.  Labs (all labs ordered are listed, but only abnormal results are displayed) Labs Reviewed   PROTIME-INR - Abnormal; Notable for the following:       Result Value   Prothrombin Time 17.7 (*)    All other components within normal limits    EKG  EKG Interpretation None       Radiology No results found.  Procedures Procedures (including critical care time)  Medications Ordered in ED Medications - No data to display   Initial Impression / Assessment and Plan / ED Course  I have reviewed the triage vital signs and the nursing notes.  Pertinent labs & imaging results that were available during my care of the patient were reviewed by me and considered in my medical decision making (see chart for details).    Patient here with bruise/hematoma to left upper arm over mid bicep. Mildly tender to palpation, no surrounding erythema. Patient is on xarelto and concern for any effects of taking that medication.  Patient here with no other complaints. Low suspicion for PE or blood clots at this time. Low suspicion for any other acute injury at this time. She has had no shortness of breath, chest pain, tachypnea, tachycardia at home or here. No unilateral leg swelling. Patient has good range of motion of left arm, good muscle strength and sensation is intact. Patient has good follow-up with her cardiologist and primary care provider. I feel safe for discharge at this time. Patient is in no apparent distress, afebrile, hemodynamic stable. She is satting at 90% and not tachycardic or tachypneic. Patient verbally understands and is agreeable with the assessment and plan. Reasons to immediately return to ED discussed.   Final Clinical Impressions(s) / ED Diagnoses   Final diagnoses:  Arm bruise, left, initial encounter    New Prescriptions New Prescriptions   No medications on file  I personally performed the services described in this documentation, which was scribed in my presence. The recorded information has been reviewed and is accurate.    [place], [place] 06/11/16  2306    Isla Pence, MD 06/11/16 [phone]

## 2016-06-11 NOTE — ED Triage Notes (Signed)
Pt presents to ED for assessment of a hematoma to her left arm patient noted after moving today.  Patient c/o some arm soreness, but is concerned about hematoma since she is currently on Xarelto for PE treatment.

## 2016-06-11 NOTE — Discharge Instructions (Signed)
Please continue taking her medications as prescribed.Please follow up with your primary care provider regarding today's visit. Please use cold compresses to the area frequency throughout the day.  Get help right away if: You have severe pain. You have numbness in a hand or foot. Your hand or foot turns pale or cold.

## 2016-06-15 ENCOUNTER — Telehealth: Payer: Self-pay | Admitting: Cardiology

## 2016-06-15 NOTE — Telephone Encounter (Signed)
xarelto [facility] 4/17. [name]

## 2016-06-15 NOTE — Telephone Encounter (Signed)
Will forward to Dr Crenshaw for review 

## 2016-06-15 NOTE — Telephone Encounter (Signed)
New message      Request for surgical clearance:  What type of surgery is being performed? D& C When is this surgery scheduled? 06-21-16 1. Are there any medications that need to be held prior to surgery and how long? Hold xarelto prior to procedure.  Received clearance but this was not addressed  2. Name of physician performing surgery?  Dr Everett Graff  3. What is your office phone and fax number?  Fax [phone]

## 2016-06-15 NOTE — Telephone Encounter (Signed)
Sent via Epic

## 2016-06-17 ENCOUNTER — Telehealth: Payer: Self-pay | Admitting: Cardiology

## 2016-06-17 NOTE — Telephone Encounter (Signed)
Spoke with Brittney Sandoval, telephone note from 06/10/16 with clearance and telephone note from 06/15/16 regarding xarelto faxed to [phone].

## 2016-06-17 NOTE — Telephone Encounter (Signed)
Please fax the clearance,they do not have Epic. Please fax this today before 2:00 if possible please.Please fax to [phone].

## 2016-06-22 ENCOUNTER — Telehealth: Payer: Self-pay | Admitting: Cardiology

## 2016-06-22 NOTE — Telephone Encounter (Signed)
Spoke with pt, Aware of dr Jacalyn Lefevre recommendations. We will continue with the amlodipine for now. She will make sure to be active during the day and will also try to prop up her legs when sitting at her desk. She will call if continues to have problems.

## 2016-06-22 NOTE — Telephone Encounter (Signed)
Spoke with pt, she had no restrictions after surgery, she is back to doing her normal daily activities. She slept off and on yesterday and walked around the house. She wants to let dr Stanford Breed know, since starting the amlodipine, she has swelling in her feet and ankles by the end of the day. They are fine in the morning. She denies SOB. Will forward for dr Stanford Breed review

## 2016-06-22 NOTE — Telephone Encounter (Signed)
Would continue DVT prophylaxis lovenox 40 mg SQ daily until she if ambulating and recovered from her surgery [name]

## 2016-06-22 NOTE — Telephone Encounter (Signed)
No need for lovenox if activities normal; stress being active and moving legs given prior pulmonary embolus Brittney Sandoval

## 2016-06-22 NOTE — Telephone Encounter (Signed)
Spoke with dr Mancel Bale office, we cleared the patient for surgery and ask for her to get DVT prophylaxis after surgery. The patient had surgery in an outpt facility yesterday and they are now calling to clarify what the patient needs. They told the patient to start aspirin 81 mg once daily. Will forward for dr Stanford Breed review

## 2016-06-22 NOTE — Telephone Encounter (Signed)
Please call,concerning pt starting back on her Xarelto after surgery.

## 2016-06-29 ENCOUNTER — Encounter: Payer: Self-pay | Admitting: *Deleted

## 2016-08-22 ENCOUNTER — Telehealth: Payer: Self-pay | Admitting: Hematology

## 2016-08-22 NOTE — Telephone Encounter (Signed)
Patient called and wanted to schedule an appintment to see a Dr she was a no show for [facility] and for labs x2 she has seen [place] in January.  She did not specify what she needed an appointment for.

## 2016-08-24 ENCOUNTER — Telehealth: Payer: Self-pay

## 2016-08-24 NOTE — Telephone Encounter (Signed)
Pt experiencing arm pain and blood clot symptoms that she has had in the past. Working on getting her a f/u appt with Dr. Lebron Conners this week or next.

## 2016-08-26 ENCOUNTER — Telehealth: Payer: Self-pay | Admitting: Hematology

## 2016-08-26 NOTE — Telephone Encounter (Signed)
Scheduled f/u with Dr. Lebron Conners per sch message from [place] 6/27 - left message and sent reminder letter in the mail.

## 2016-08-30 ENCOUNTER — Telehealth: Payer: Self-pay | Admitting: Cardiology

## 2016-08-30 ENCOUNTER — Ambulatory Visit: Payer: Medicaid Other | Admitting: Cardiology

## 2016-08-30 NOTE — Telephone Encounter (Signed)
LEFT MESSAGE TO CALL BACK

## 2016-08-30 NOTE — Telephone Encounter (Signed)
Spoke to patient . She states she is not having the swelling at present time.  she states primary - patient is having cervical spasm in neck and back -  Was given muscle relaxant from primary. Patient states she has not taken today. Will do when she gets home.   RN  Called patient earlier to offer appointment- patie had not return call.  Continue with current treatment. If symptoms of chest pain occurs go TO ER. Otherwise keep appointment.  will contact patient if opening becomes available.  patient voice understanding.

## 2016-08-30 NOTE — Telephone Encounter (Signed)
New message  Pt call requesting to speak with RN. Pt stats she been having some chest discomfort, swelling in both legs but the right has been the worse. Pt states it could possible be edema, but would like to speak with RN. Pt has appt scheduled for 7/10 with [facility]. Please call back to discuss

## 2016-09-02 ENCOUNTER — Other Ambulatory Visit: Payer: Self-pay | Admitting: Cardiology

## 2016-09-02 ENCOUNTER — Other Ambulatory Visit (HOSPITAL_COMMUNITY)
Admission: AD | Admit: 2016-09-02 | Discharge: 2016-09-02 | Disposition: A | Discharge status: Home / Self Care | Payer: Medicaid Other | Source: Ambulatory Visit | Attending: Hematology and Oncology | Admitting: Hematology and Oncology

## 2016-09-02 ENCOUNTER — Ambulatory Visit (HOSPITAL_BASED_OUTPATIENT_CLINIC_OR_DEPARTMENT_OTHER): Payer: Medicaid Other | Admitting: Hematology and Oncology

## 2016-09-02 ENCOUNTER — Telehealth: Payer: Self-pay | Admitting: Hematology and Oncology

## 2016-09-02 ENCOUNTER — Ambulatory Visit (HOSPITAL_BASED_OUTPATIENT_CLINIC_OR_DEPARTMENT_OTHER): Payer: Medicaid Other

## 2016-09-02 ENCOUNTER — Ambulatory Visit: Payer: Medicaid Other | Admitting: Hematology and Oncology

## 2016-09-02 ENCOUNTER — Encounter: Payer: Self-pay | Admitting: Hematology and Oncology

## 2016-09-02 VITALS — BP 150/99 | HR 89 | Temp 97.8°F | Resp 18 | Ht 64.0 in | Wt 221.0 lb

## 2016-09-02 DIAGNOSIS — I2699 Other pulmonary embolism without acute cor pulmonale: Secondary | ICD-10-CM

## 2016-09-02 DIAGNOSIS — R0789 Other chest pain: Secondary | ICD-10-CM

## 2016-09-02 DIAGNOSIS — M7989 Other specified soft tissue disorders: Secondary | ICD-10-CM

## 2016-09-02 DIAGNOSIS — I82492 Acute embolism and thrombosis of other specified deep vein of left lower extremity: Secondary | ICD-10-CM

## 2016-09-02 LAB — D-DIMER, QUANTITATIVE: D-Dimer, Quant: 0.76 ug/mL-FEU — ABNORMAL HIGH (ref 0.00–0.50)

## 2016-09-02 NOTE — Patient Instructions (Signed)
Thank you for choosing [facility] to provide your oncology and hematology care.  To afford each patient quality time with our providers, please arrive 30 minutes before your scheduled appointment time.  If you arrive late for your appointment, you may be asked to reschedule.  We strive to give you quality time with our providers, and arriving late affects you and other patients whose appointments are after yours.   If you are a no show for multiple scheduled visits, you may be dismissed from the clinic at the providers discretion.    Again, thank you for choosing [facility], our hope is that these requests will decrease the amount of time that you wait before being seen by our physicians.  ______________________________________________________________________  Should you have questions after your visit to the [facility], please contact our office at [phone] between the hours of 8:30 and 4:30 p.m.    Voicemails left after 4:30p.m will not be returned until the following business day.    For prescription refill requests, please have your pharmacy contact us directly.  Please also try to allow 48 hours for prescription requests.    Please contact the scheduling department for questions regarding scheduling.  For scheduling of procedures such as PET scans, CT scans, MRI, Ultrasound, etc please contact central scheduling at [phone].    Resources For Cancer Patients and Caregivers:   Oncolink.org:  A wonderful resource for patients and healthcare providers for information regarding your disease, ways to tract your treatment, what to expect, etc.     American Cancer Society:  800-227-2345  Can help patients locate various types of support and financial assistance  Cancer Care: 1-800-813-HOPE (4673) Provides financial assistance, online support groups, medication/co-pay assistance.    [place] DSS:  336-641-3447 Where to apply for food  stamps, Medicaid, and utility assistance  Medicare Rights Center: 800-333-4114 Helps people with Medicare understand their rights and benefits, navigate the Medicare system, and secure the quality healthcare they deserve  SCAT: 336-333-6589 [place] [employer]'s shared-ride transportation service for eligible riders who have a disability that prevents them from riding the fixed route bus.    For additional information on assistance programs please contact our social worker:   Grier Hock/Abigail Elmore:  [phone]            

## 2016-09-02 NOTE — Telephone Encounter (Signed)
Lab added per 09/02/16 los. Patient was given a copy of the AVS report , per 09/02/16 los.

## 2016-09-02 NOTE — Progress Notes (Signed)
[facility] Follow-up Visit:  Assessment: Acute pulmonary embolism [facility]) 36 year old female who has developed deep vein thrombosis in the right lower extremity as well as pulmonary embolism without tracheal a strain in October 2017 in the postpartum period. Patient has successfully completed the prescribed six-month course of therapeutic anticoagulation with rivaroxaban in April 2018.  Patient presents with recurrence of symptoms that previously accompanied development of the DVT/PE consisting of discomfort in the right lower extremity, bilateral extremity swelling, and sensation of tightness and discomfort over the lower neck, left shoulder and left lateral chest wall.  Examination is positive for bilateral lower extremity swelling, but negative for active vascular cords. Pulmonary examination is unremarkable. Vital signs demonstrates significant hypertension with systolic of 371 over diastolic of 99. Exhibit significant amount of anxiety due to her symptoms.  Patient does have a certain risk of recurrent DVT, although her examination findings at this time are not conclusive with the problem. The intermittent and recurrent nature of the symptoms likely indicates other etiologies such as musculoskeletal discomfort from muscle spasms in the neck and chest, deconditioning.  Swelling of the lower extremities to hypertension, but some degree of venous insufficiency cannot be excluded this time.  Due to persistent risk of recurrent clotting, we will obtain a d-dimer. The fats negative, this reduces the likelihood of recurrent clot dramatically as the d-dimer has an excellent predictive value. If the d-dimer is elevated, additional evaluation with imaging will likely need to be obtained, but due to significant hypertension data evaluation will be conducted through the emergency room.  Plan: --Obtain d-dimer today -- if negative, no further evaluation is needed at this time. Stiff, we  will likely direct patient emergency room to address her hypertension as well as to conduct additional assessment.   Orders Placed This Encounter  Procedures  . D-dimer, quantitative    Standing Status:   Future    Number of Occurrences:   1    Standing Expiration Date:   09/02/2017    Cancer Staging No matching staging information was found for the patient.  All questions were answered.   The patient knows to call the clinic with any problems, questions or concerns.  This note was electronically signed.    History of Presenting Illness Brittney Sandoval 36 y.o. presenting to the [facility] for Recurrent symptoms of bilateral lower extremity swelling, right lower extremity discomfort, left chest, neck and shoulder pain.  Patient's past hematological history is significant for a deep vein thrombosis with pulmonary embolization that have occurred in the postpartum setting in October 2017. Patient was treated with her Rivaroxaban (Xarelto) course for 6 months she has completed in April 2018. Patient was evaluated by Dr. Irene Limbo and followed his recommendations. Since discontinuing Rivaroxaban (Xarelto), patient had recurrent episodes of discomfort in bilateral neck, left shoulder, left lateral chest wall and right thigh with intermittent bilateral lower extremity swelling to the level of the ankles and occasionally knees. This led to a previous reevaluation of the lower extremity is in Apr 2018 which revealed no evidence of recurrent VTE.   Patient denies significant shortness of breath or palpitations. Denies any nausea, vomiting, abdominal pain, diarrhea but is experiencing constipation due to taking oral iron supplementation. Her iron supplementation intake is intermittent due to constipation difficulties. Patient denies any dysuria or hematuria. No hemoptysis, no pleuritic component to the chest pain at this point in time. She denies any fevers, chills, night sweats. No  cough.  Oncological/hematological History:  No  history exists.    Medical History: Past Medical History:  Diagnosis Date  . Anemia   . DVT (deep venous thrombosis) ([place])    a. 11/2015 U/S: Left Peroneal DVT;  b. 02/2016 U/S: No DVT bilat.  . Essential hypertension    a. 01/2016 Echo: EF 60-65%, no rwma, triv MR/TR, PASP 15mmHg.  . Fibroid   . Pulmonary emboli ([place])    a. 11/2015 CTA Chest: PE involving the segmental and subsegmental branches of the RML & RLL-->Xarelto.    Surgical History: Past Surgical History:  Procedure Laterality Date  . CESAREAN SECTION N/A 04/04/2013   Procedure: CESAREAN SECTION;  Surgeon: Emily Filbert, MD;  Location: [facility];  Service: Obstetrics;  Laterality: N/A;  . CESAREAN SECTION N/A 11/25/2015   Procedure: CESAREAN SECTION;  Surgeon: Everett Graff, MD;  Location: [facility];  Service: Obstetrics;  Laterality: N/A;  . LAPAROSCOPIC CHOLECYSTECTOMY  2010   [place], [place]  . TONSILLECTOMY      Family History: Family History  Problem Relation Age of Onset  . Diabetes Mother   . Hypertension Mother   . Kidney disease Mother   . Heart disease Mother     Social History: Social History   Social History  . Marital status: Single    Spouse name: N/A  . Number of children: N/A  . Years of education: N/A   Occupational History  . Not on file.   Social History Main Topics  . Smoking status: Never Smoker  . Smokeless tobacco: Never Used  . Alcohol use Yes     Comment: occassionally  . Drug use: No  . Sexual activity: Not Currently    Birth control/ protection: None   Other Topics Concern  . Not on file   Social History Narrative   Lives in [place].  Relocated to [place] a few years ago.    Allergies: No Known Allergies  Medications:  Current Outpatient Prescriptions  Medication Sig Dispense Refill  . amLODipine (NORVASC) 10 MG tablet Take 10 mg by mouth daily.    . cyclobenzaprine (FLEXERIL) 10 MG  tablet cyclobenzaprine 10 mg tablet  TAKE 1 TABLET BY MOUTH 3 TIMES PER DAY FOR 30 DAYS    . desonide (DESOWEN) 0.05 % ointment Apply 1 application topically 2 (two) times daily.    . ferrous sulfate 325 (65 FE) MG tablet Take 1 tablet by mouth 2 (two) times daily.  1  . oxyCODONE (OXY IR/ROXICODONE) 5 MG immediate release tablet Take 1 tablet (5 mg total) by mouth every 4 (four) hours as needed (pain scale 4-7). 30 tablet 0  . Prenatal Vit-Fe Phos-FA-Omega (VITAFOL GUMMIES) 3.33-0.333-34.8 MG CHEW 3 (three) times daily.  11   No current facility-administered medications for this visit.     Review of Systems: Review of Systems  Constitutional: Positive for fatigue and unexpected weight change. Negative for appetite change, chills, diaphoresis and fever.       Patient reports fluid retention and associated weight gain.  HENT:   Negative for mouth sores.   Respiratory: Positive for chest tightness. Negative for cough, hemoptysis, shortness of breath and wheezing.        Patient described a area of tightness that wraps over the left side of the chest posteriorly over the scapula over the left shoulder and around the neck.  Cardiovascular: Positive for chest pain and leg swelling. Negative for palpitations.  Gastrointestinal: Positive for constipation. Negative for abdominal distention, abdominal pain, blood in stool, diarrhea, nausea  and vomiting.  Genitourinary: Negative for difficulty urinating and hematuria.   Musculoskeletal: Positive for myalgias, neck pain and neck stiffness. Negative for arthralgias, back pain and flank pain.  Skin: Negative for itching, rash and wound.  Psychiatric/Behavioral: The patient is nervous/anxious.   All other systems reviewed and are negative.    PHYSICAL EXAMINATION Blood pressure (!) 150/99, pulse 89, temperature 97.8 F (36.6 C), temperature source Oral, resp. rate 18, height 5\' 4"  (1.626 m), weight 221 lb (100.2 kg), SpO2 100 %, unknown if currently  breastfeeding.  ECOG PERFORMANCE STATUS: 1 - Symptomatic but completely ambulatory  Physical Exam  Constitutional: She is oriented to person, place, and time and well-developed, well-nourished, and in no distress. She appears to not be writhing in pain, not malnourished and not jaundiced.  Non-toxic appearance. She does not have a sickly appearance. No distress.  HENT:  Head: Head is without abrasion, without contusion and without laceration.  Eyes: Conjunctivae and EOM are normal. Pupils are equal, round, and reactive to light. No scleral icterus.  Neck: No JVD present. Muscular tenderness present. No tracheal tenderness present. No neck rigidity. No edema present. No thyroid mass and no thyromegaly present.  Cardiovascular: Normal rate, regular rhythm, S1 normal, S2 normal, intact distal pulses and normal pulses.   No extrasystoles are present. Exam reveals no gallop.   No murmur heard. Bilateral trace lower extremity edema.  Pulmonary/Chest: Effort normal and breath sounds normal. No respiratory distress. She has no decreased breath sounds. She has no wheezes. She has no rhonchi.  Abdominal: Soft. Normal appearance. There is no hepatosplenomegaly. There is no tenderness. There is no CVA tenderness.  Musculoskeletal:  No palpable vascular cords in bilateral lower extremities.  Lymphadenopathy:  No pathologic lymphadenopathy  Neurological: She is alert and oriented to person, place, and time.  No focal neurological abnormalities.  Skin: She is not diaphoretic.     LABORATORY DATA: I have personally reviewed the data as listed: No visits with results within 1 Week(s) from this visit.  Latest known visit with results is:  Admission on 06/11/2016, Discharged on 06/11/2016  Component Date Value Ref Range Status  . Prothrombin Time 06/11/2016 17.7* 11.4 - 15.2 seconds Final  . INR 06/11/2016 1.45   Final       Ardath Sax, MD

## 2016-09-02 NOTE — Assessment & Plan Note (Signed)
36 year old female who has developed deep vein thrombosis in the right lower extremity as well as pulmonary embolism without tracheal a strain in October 2017 in the postpartum period. Patient has successfully completed the prescribed six-month course of therapeutic anticoagulation with rivaroxaban in April 2018.  Patient presents with recurrence of symptoms that previously accompanied development of the DVT/PE consisting of discomfort in the right lower extremity, bilateral extremity swelling, and sensation of tightness and discomfort over the lower neck, left shoulder and left lateral chest wall.  Examination is positive for bilateral lower extremity swelling, but negative for active vascular cords. Pulmonary examination is unremarkable. Vital signs demonstrates significant hypertension with systolic of 539 over diastolic of 99. Exhibit significant amount of anxiety due to her symptoms.  Patient does have a certain risk of recurrent DVT, although her examination findings at this time are not conclusive with the problem. The intermittent and recurrent nature of the symptoms likely indicates other etiologies such as musculoskeletal discomfort from muscle spasms in the neck and chest, deconditioning.  Swelling of the lower extremities to hypertension, but some degree of venous insufficiency cannot be excluded this time.  Due to persistent risk of recurrent clotting, we will obtain a d-dimer. The fats negative, this reduces the likelihood of recurrent clot dramatically as the d-dimer has an excellent predictive value. If the d-dimer is elevated, additional evaluation with imaging will likely need to be obtained, but due to significant hypertension data evaluation will be conducted through the emergency room.  Plan: --Obtain d-dimer today -- if negative, no further evaluation is needed at this time. Stiff, we will likely direct patient emergency room to address her hypertension as well as to conduct  additional assessment.

## 2016-09-06 ENCOUNTER — Encounter: Payer: Self-pay | Admitting: Physician Assistant

## 2016-09-06 ENCOUNTER — Telehealth: Payer: Self-pay

## 2016-09-06 ENCOUNTER — Ambulatory Visit (INDEPENDENT_AMBULATORY_CARE_PROVIDER_SITE_OTHER): Payer: Medicaid Other | Admitting: Physician Assistant

## 2016-09-06 VITALS — BP 138/82 | HR 85 | Ht 64.0 in | Wt 218.4 lb

## 2016-09-06 DIAGNOSIS — I2782 Chronic pulmonary embolism: Secondary | ICD-10-CM | POA: Diagnosis not present

## 2016-09-06 DIAGNOSIS — Z79899 Other long term (current) drug therapy: Secondary | ICD-10-CM | POA: Diagnosis not present

## 2016-09-06 DIAGNOSIS — R079 Chest pain, unspecified: Secondary | ICD-10-CM

## 2016-09-06 DIAGNOSIS — I10 Essential (primary) hypertension: Secondary | ICD-10-CM | POA: Diagnosis not present

## 2016-09-06 MED ORDER — HYDROCHLOROTHIAZIDE 25 MG PO TABS: 25.0000 mg | ORAL_TABLET | Freq: Every day | ORAL | 1 refills | Status: DC

## 2016-09-06 NOTE — Patient Instructions (Signed)
Medication Instructions:   HOLD Amlodipine starting today  START HCTZ 25mg  DAILY  Labwork:   BMET today  Testing/Procedures:  none  Follow-Up:  With Almyra Deforest PA in 3 weeks   If you need a refill on your cardiac medications before your next appointment, please call your pharmacy.

## 2016-09-06 NOTE — Addendum Note (Signed)
Addended by: Ardath Sax on: 09/06/2016 01:28 PM   Modules accepted: Orders

## 2016-09-06 NOTE — Telephone Encounter (Signed)
Contacted pt to f/u regarding imaging query. Dr. Lebron Conners is not concerned at this time regarding elevated d-dimer without change in pt symptoms. Pt voiced elevated concern regarding lab finding and stated,"I cannot walk around like this waiting for something bad to happen."  MD notified of conversation with pt and CT of chest to be ordered. Pt told to expect a phone call to schedule imaging appt.

## 2016-09-06 NOTE — Progress Notes (Signed)
Cardiology Office Note    Date:  09/06/2016   ID:  Brittney Sandoval, DOB April 12, 1980, MRN [id number]  PCP:  Lin Landsman, MD  Cardiologist:  Dr. Stanford Breed  Chief Complaint  Patient presents with  . chest discomfort  . Leg Swelling    RIGHT leg  . Follow-up    seen for Dr. Stanford Breed    History of Present Illness:  Brittney Sandoval is a 36 y.o. female with PMH of HTN, DVT and PE. She delivered by C-section on 11/25/2015. She was found to have pulmonary emboli in October 2017. Venous Doppler at that time was also positive for DVT as well. She was treated with Xarelto. Echocardiogram in December 2017 showed normal LV systolic function. Lower extremity Dopplers in January 2018 showed no DVT. She was last seen by Dr. Stanford Breed on 06/06/2016. She was instructed to complete a 6 month of Xarelto and discontinue the medication on 06/14/2016. Her amlodipine was discontinued in favor of chlorthalidone due to pedal edema. Due to cost reasons, she returned to amlodipine later was pedal edema resolved. Recently, she has been noticing increasing amount of lower extremity edema, she was seen by oncology, d-dimer was ordered which came back 0.76.  She presents today for cardiology office evaluation. Despite her complaint of lower extremity edema, I did not actually see significant swelling. Recently she has had chest pain and left flank pain that is occurring on a daily basis. EKG today obtaining in the office showed no significant ischemic changes. According to the patient, she has discussed with her hematologist who plan to obtain a CT of the chest. She also has been complaining of right upper leg pain and also left foot numbness. Her chest pain is referred atypical and does not seems to worsen with exertion. She described her pain as almost everywhere. Suspicion for ACS with relatively low, I would recommend continued observation for now. Once she is ruled out of any recurrent PE or DVT, then we can consider  plain old treadmill test. As far as her lower extremity swelling, I will switch her from amlodipine to hydrochlorothiazide 25 mg daily.   Past Medical History:  Diagnosis Date  . Anemia   . DVT (deep venous thrombosis) ([place])    a. 11/2015 U/S: Left Peroneal DVT;  b. 02/2016 U/S: No DVT bilat.  . Essential hypertension    a. 01/2016 Echo: EF 60-65%, no rwma, triv MR/TR, PASP 43mmHg.  . Fibroid   . Pulmonary emboli ([place])    a. 11/2015 CTA Chest: PE involving the segmental and subsegmental branches of the RML & RLL-->Xarelto.    Past Surgical History:  Procedure Laterality Date  . CESAREAN SECTION N/A 04/04/2013   Procedure: CESAREAN SECTION;  Surgeon: Emily Filbert, MD;  Location: [facility];  Service: Obstetrics;  Laterality: N/A;  . CESAREAN SECTION N/A 11/25/2015   Procedure: CESAREAN SECTION;  Surgeon: Everett Graff, MD;  Location: [facility];  Service: Obstetrics;  Laterality: N/A;  . LAPAROSCOPIC CHOLECYSTECTOMY  2010   [place], [place]  . TONSILLECTOMY      Current Medications: Outpatient Medications Prior to Visit  Medication Sig Dispense Refill  . amLODipine (NORVASC) 10 MG tablet Take 10 mg by mouth daily. HOLD this medication for now (as of 09/06/16)    . cyclobenzaprine (FLEXERIL) 10 MG tablet cyclobenzaprine 10 mg tablet  TAKE 1 TABLET BY MOUTH 3 TIMES PER DAY FOR 30 DAYS    . ferrous sulfate 325 (65 FE) MG tablet Take  1 tablet by mouth 2 (two) times daily.  1  . amLODipine (NORVASC) 10 MG tablet TAKE 1 TABLET (10 MG TOTAL) BY MOUTH DAILY. (Patient not taking: Reported on 09/06/2016) 30 tablet 5  . desonide (DESOWEN) 0.05 % ointment Apply 1 application topically 2 (two) times daily.    Marland Kitchen oxyCODONE (OXY IR/ROXICODONE) 5 MG immediate release tablet Take 1 tablet (5 mg total) by mouth every 4 (four) hours as needed (pain scale 4-7). (Patient not taking: Reported on 09/06/2016) 30 tablet 0  . Prenatal Vit-Fe Phos-FA-Omega (VITAFOL GUMMIES) 3.33-0.333-34.8 MG CHEW 3  (three) times daily.  11   No facility-administered medications prior to visit.      Allergies:   Patient has no known allergies.   Social History   Social History  . Marital status: Single    Spouse name: N/A  . Number of children: N/A  . Years of education: N/A   Social History Main Topics  . Smoking status: Never Smoker  . Smokeless tobacco: Never Used  . Alcohol use Yes     Comment: occassionally  . Drug use: No  . Sexual activity: Not Currently    Birth control/ protection: None   Other Topics Concern  . None   Social History Narrative   Lives in [place].  Relocated to [place] a few years ago.     Family History:  The patient's family history includes Diabetes in her mother; Heart disease in her mother; Hypertension in her mother; Kidney disease in her mother.   ROS:   Please see the history of present illness.    ROS All other systems reviewed and are negative.   PHYSICAL EXAM:   VS:  BP 138/82   Pulse 85   Ht 5\' 4"  (1.626 m)   Wt 218 lb 6.4 oz (99.1 kg)   BMI 37.49 kg/m    GEN: Well nourished, well developed, in no acute distress  HEENT: normal  Neck: no JVD, carotid bruits, or masses Cardiac: RRR; no murmurs, rubs, or gallops,no edema  Respiratory:  clear to auscultation bilaterally, normal work of breathing GI: soft, nontender, nondistended, + BS MS: no deformity or atrophy  Skin: warm and dry, no rash Neuro:  Alert and Oriented x 3, Strength and sensation are intact Psych: euthymic mood, full affect  Wt Readings from Last 3 Encounters:  09/06/16 218 lb 6.4 oz (99.1 kg)  09/02/16 221 lb (100.2 kg)  06/11/16 213 lb 1 oz (96.6 kg)      Studies/Labs Reviewed:   EKG:  EKG is ordered today.  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  ------------------------------------------------------------------- History:   Risk factors:  Pulmonary embolus. Anemia. Hypertension. Obese.  ------------------------------------------------------------------- Study Conclusions  - Left ventricle: The cavity size was normal. Wall thickness was   normal. Systolic function was normal. The estimated ejection   fraction was in the range of 60% to 65%. Wall motion was normal;   there were no regional wall motion abnormalities. Left   ventricular diastolic function parameters were normal. - Mitral valve: Mildly thickened leaflets . There was trivial  regurgitation. - Left atrium: The atrium was normal in size. - Tricuspid valve: There was trivial regurgitation. - Pulmonary arteries: PA peak pressure: 13 mm Hg (S). - Inferior vena cava: The vessel was normal in size. The   respirophasic diameter changes were in the normal range (>= 50%),   consistent with normal central venous pressure.  Impressions:  - LVEF 60-65%, normal wall thickness, motion and diastolic   function, trivial MR, normal LA size, trivial TR, RVSP 13 mmHg,   normal IVC.    ASSESSMENT:    1. Chest pain, unspecified type   2. Encounter for long-term (current) use of medications   3. Essential hypertension   4. Other chronic pulmonary embolism without acute cor pulmonale (HCC)      PLAN:  In order of problems listed above:  1. Chest pain: Somewhat atypical, denies any shortness of breath, however she says even with the previous PE she never had any shortness of breath. Suspicion for ACS relatively low. I recommend a secondary workup first. She says her hematologist likely will order a CT  angiogram of the chest given recent positive d-dimer to make sure she does not have any recurrent PE. If she is still symptomatic despite negative PE, then we can potentially consider a plain old treadmill test later.  2. Hypertension: Blood pressure mildly elevated, currently on amlodipine, we will hold amlodipine and transition this to HCTZ for lower extremity edema. If blood pressure is not adequately controlled on HCTZ, I plan to restart her amlodipine at 5 mg daily.  3. History of PE occurred in 2017 after childbirth: Finished six-month of Xarelto.    Medication Adjustments/Labs and Tests Ordered: Current medicines are reviewed at length with the patient today.  Concerns regarding medicines are outlined above.  Medication changes, Labs and Tests ordered today are listed in the Patient Instructions below. Patient Instructions  Medication Instructions:   HOLD Amlodipine starting today  START HCTZ 25mg  DAILY  Labwork:   BMET today  Testing/Procedures:  none  Follow-Up:  With Almyra Deforest PA in 3 weeks   If you need a refill on your cardiac medications before your next appointment, please call your pharmacy.      Hilbert Corrigan, [place]  09/06/2016 6:01 PM    [facility] [address], [place], [place]  [address] Phone: [phone]; Fax: [phone]

## 2016-09-14 ENCOUNTER — Ambulatory Visit (HOSPITAL_COMMUNITY)
Admission: RE | Admit: 2016-09-14 | Discharge: 2016-09-14 | Disposition: A | Discharge status: Home / Self Care | Payer: Medicaid Other | Source: Ambulatory Visit | Attending: Hematology and Oncology | Admitting: Hematology and Oncology

## 2016-09-14 DIAGNOSIS — I2699 Other pulmonary embolism without acute cor pulmonale: Secondary | ICD-10-CM | POA: Diagnosis present

## 2016-09-14 DIAGNOSIS — Z86718 Personal history of other venous thrombosis and embolism: Secondary | ICD-10-CM | POA: Insufficient documentation

## 2016-09-14 DIAGNOSIS — J9811 Atelectasis: Secondary | ICD-10-CM | POA: Insufficient documentation

## 2016-09-14 DIAGNOSIS — Z9049 Acquired absence of other specified parts of digestive tract: Secondary | ICD-10-CM | POA: Diagnosis not present

## 2016-09-14 DIAGNOSIS — Z86711 Personal history of pulmonary embolism: Secondary | ICD-10-CM | POA: Diagnosis not present

## 2016-09-14 MED ORDER — IOPAMIDOL (ISOVUE-370) INJECTION 76%
INTRAVENOUS | Status: AC
  Filled 2016-09-14: qty 100

## 2016-09-14 MED ORDER — IOPAMIDOL (ISOVUE-370) INJECTION 76%
100.0000 mL | Freq: Once | INTRAVENOUS | Status: AC | PRN
  Administered 2016-09-14: 77 mL via INTRAVENOUS

## 2016-09-16 LAB — BASIC METABOLIC PANEL
BUN / CREAT RATIO: 14 (ref 9–23)
BUN: 11 mg/dL (ref 6–20)
CALCIUM: 10.3 mg/dL — AB (ref 8.7–10.2)
CHLORIDE: 102 mmol/L (ref 96–106)
CO2: 24 mmol/L (ref 20–29)
CREATININE: 0.81 mg/dL (ref 0.57–1.00)
GFR, EST AFRICAN AMERICAN: 107 mL/min/{1.73_m2} (ref >59)
GFR, EST NON AFRICAN AMERICAN: 93 mL/min/{1.73_m2} (ref >59)
Glucose: 91 mg/dL (ref 65–99)
Potassium: 4.4 mmol/L (ref 3.5–5.2)
Sodium: 140 mmol/L (ref 134–144)

## 2016-09-16 NOTE — Progress Notes (Signed)
Renal function and electrolyte stable.

## 2016-09-19 ENCOUNTER — Telehealth: Payer: Self-pay

## 2016-09-19 ENCOUNTER — Telehealth: Payer: Self-pay | Admitting: *Deleted

## 2016-09-19 NOTE — Telephone Encounter (Signed)
CT scan reveals no evidence of pulmonary embolus. No additional abnormalities of clinical significance noted.

## 2016-09-19 NOTE — Telephone Encounter (Signed)
"  [facility] for results of my scan from 09-14-2016.  Return number [phone]."  Confirmed receipt of this request which has been sent to providers collaborative.  No f/u scheduled at this time.

## 2016-09-19 NOTE — Telephone Encounter (Signed)
Per Dr. Lebron Conners, relayed to patient that her recent Chest CT did not show evidence of any clots. Therefore, that would not be a possible cause of her symptom(s). Pt verbalized understanding and thanks for the information.

## 2016-09-20 NOTE — Telephone Encounter (Signed)
Called patient.  Results provided as noted by provider.  Denies further questions.Marland Kitchen

## 2016-09-30 ENCOUNTER — Ambulatory Visit: Payer: Medicaid Other | Admitting: Physician Assistant

## 2016-09-30 NOTE — Progress Notes (Deleted)
Cardiology Office Note    Date:  09/30/2016   ID:  Brittney Sandoval, DOB 1980/09/16, MRN [id number]  PCP:  Lin Landsman, MD  Cardiologist:  Dr. Stanford Breed   No chief complaint on file.   History of Present Illness:  Brittney Sandoval is a 36 y.o. female with PMH of HTN, DVT and PE. She delivered by C-section on 11/25/2015. She was found to have pulmonary emboli in October 2017. Venous Doppler at that time was also positive for DVT as well. She was treated with Xarelto. Echocardiogram in December 2017 showed normal LV systolic function. Lower extremity Dopplers in January 2018 showed no DVT. She was last seen by Dr. Stanford Breed on 06/06/2016. She was instructed to complete a 6 month of Xarelto and discontinue the medication on 06/14/2016. Her amlodipine was discontinued in favor of chlorthalidone due to pedal edema. Due to cost reasons, she returned to amlodipine later was pedal edema resolved. Recently, she has been noticing increasing amount of lower extremity edema, she was seen by oncology, d-dimer was ordered which came back 0.76. I saw the patient on 09/06/2016, her chest pain was very atypical. CT angiogram of the chest was negative for PE. She presents today for follow-up.  No EKG    Past Medical History:  Diagnosis Date  . Anemia   . DVT (deep venous thrombosis) ([place])    a. 11/2015 U/S: Left Peroneal DVT;  b. 02/2016 U/S: No DVT bilat.  . Essential hypertension    a. 01/2016 Echo: EF 60-65%, no rwma, triv MR/TR, PASP 80mmHg.  . Fibroid   . Pulmonary emboli ([place])    a. 11/2015 CTA Chest: PE involving the segmental and subsegmental branches of the RML & RLL-->Xarelto.    Past Surgical History:  Procedure Laterality Date  . CESAREAN SECTION N/A 04/04/2013   Procedure: CESAREAN SECTION;  Surgeon: Emily Filbert, MD;  Location: [facility];  Service: Obstetrics;  Laterality: N/A;  . CESAREAN SECTION N/A 11/25/2015   Procedure: CESAREAN SECTION;  Surgeon: Everett Graff, MD;  Location: [facility];  Service: Obstetrics;  Laterality: N/A;  . LAPAROSCOPIC CHOLECYSTECTOMY  2010   [address], [place]  . TONSILLECTOMY      Current Medications: Outpatient Medications Prior to Visit  Medication Sig Dispense Refill  . amLODipine (NORVASC) 10 MG tablet Take 10 mg by mouth daily. HOLD this medication for now (as of 09/06/16)    . cyclobenzaprine (FLEXERIL) 10 MG tablet cyclobenzaprine 10 mg tablet  TAKE 1 TABLET BY MOUTH 3 TIMES PER DAY FOR 30 DAYS    . desonide (DESOWEN) 0.05 % lotion Apply 1 application topically 3 (three) times daily.    . ferrous sulfate 325 (65 FE) MG tablet Take 1 tablet by mouth 2 (two) times daily.  1  . hydrochlorothiazide (HYDRODIURIL) 25 MG tablet Take 1 tablet (25 mg total) by mouth daily. 90 tablet 1  . ibuprofen (ADVIL,MOTRIN) 800 MG tablet Take 1 tablet by mouth every 8 (eight) hours as needed for pain.  0   No facility-administered medications prior to visit.      Allergies:   Patient has no known allergies.   Social History   Social History  . Marital status: Single    Spouse name: N/A  . Number of children: N/A  . Years of education: N/A   Social History Main Topics  . Smoking status: Never Smoker  . Smokeless tobacco: Never Used  . Alcohol use Yes     Comment: occassionally  .  Drug use: No  . Sexual activity: Not Currently    Birth control/ protection: None   Other Topics Concern  . Not on file   Social History Narrative   Lives in [place].  Relocated to [place] a few years ago.     Family History:  The patient's ***family history includes Diabetes in her mother; Heart disease in her mother; Hypertension in her mother; Kidney disease in her mother.   ROS:   Please see the history of present illness.    ROS All other systems reviewed and are negative.   PHYSICAL EXAM:   VS:  There were no vitals taken for this visit.   GEN: Well nourished, well developed, in no acute distress  HEENT: normal  Neck:  no JVD, carotid bruits, or masses Cardiac: ***RRR; no murmurs, rubs, or gallops,no edema  Respiratory:  clear to auscultation bilaterally, normal work of breathing GI: soft, nontender, nondistended, + BS MS: no deformity or atrophy  Skin: warm and dry, no rash Neuro:  Alert and Oriented x 3, Strength and sensation are intact Psych: euthymic mood, full affect  Wt Readings from Last 3 Encounters:  09/06/16 218 lb 6.4 oz (99.1 kg)  09/02/16 221 lb (100.2 kg)  06/11/16 213 lb 1 oz (96.6 kg)      Studies/Labs Reviewed:   EKG:  EKG is*** ordered today.  The ekg ordered today demonstrates ***  Recent Labs: 03/29/2016: ALT 19; HGB 10.9; Platelets 339 09/15/2016: BUN 11; Creatinine, Ser 0.81; Potassium 4.4; Sodium 140   Lipid Panel No results found for: CHOL, TRIG, HDL, CHOLHDL, VLDL, LDLCALC, LDLDIRECT  Additional studies/ records that were reviewed today include:  ***    ASSESSMENT:    No diagnosis found.   PLAN:  In order of problems listed above:  1. ***    Medication Adjustments/Labs and Tests Ordered: Current medicines are reviewed at length with the patient today.  Concerns regarding medicines are outlined above.  Medication changes, Labs and Tests ordered today are listed in the Patient Instructions below. There are no Patient Instructions on file for this visit.   Hilbert Corrigan, [place]  09/30/2016 2:08 PM    [facility] [place], [place], [place]  [address] Phone: [phone]; Fax: [phone]

## 2016-10-19 ENCOUNTER — Encounter: Payer: Self-pay | Admitting: Cardiology

## 2016-10-19 ENCOUNTER — Ambulatory Visit (INDEPENDENT_AMBULATORY_CARE_PROVIDER_SITE_OTHER): Payer: Medicaid Other | Admitting: Physician Assistant

## 2016-10-19 ENCOUNTER — Encounter: Payer: Self-pay | Admitting: Physician Assistant

## 2016-10-19 VITALS — BP 132/85 | HR 74 | Ht 64.0 in | Wt 219.8 lb

## 2016-10-19 DIAGNOSIS — I825Y9 Chronic embolism and thrombosis of unspecified deep veins of unspecified proximal lower extremity: Secondary | ICD-10-CM

## 2016-10-19 DIAGNOSIS — I10 Essential (primary) hypertension: Secondary | ICD-10-CM

## 2016-10-19 DIAGNOSIS — R079 Chest pain, unspecified: Secondary | ICD-10-CM | POA: Diagnosis not present

## 2016-10-19 DIAGNOSIS — R0789 Other chest pain: Secondary | ICD-10-CM | POA: Diagnosis not present

## 2016-10-19 NOTE — Progress Notes (Signed)
Cardiology Office Note    Date:  10/21/2016   ID:  Brittney Sandoval, DOB 1980/05/17, MRN [id number]  PCP:  Lin Landsman, MD  Cardiologist:  Dr. Stanford Breed   Chief Complaint  Patient presents with  . Follow-up    seen for Dr. Stanford Breed    History of Present Illness:  Brittney Sandoval is a 35 y.o. female with PMH of HTN, DVT and PE. She delivered by C-section on 11/25/2015. She was found to have pulmonary emboli in October 2017. Venous Doppler at that time was also positive for DVT as well. She was treated with Xarelto. Echocardiogram in December 2017 showed normal LV systolic function. Lower extremity Dopplers in January 2018 showed no DVT. She was last seen by Dr. Stanford Breed on 06/06/2016. She was instructed to complete a 6 month of Xarelto and discontinue the medication on 06/14/2016. Her amlodipine was discontinued in favor of chlorthalidone due to pedal edema. Due to cost reasons, she returned to amlodipine later once pedal edema resolved. Recently, she has been noticing increasing amount of lower extremity edema, she was seen by oncology, d-dimer was ordered which came back 0.76. CTA of chest was negative. I last saw the patient on 09/06/2016, her chest pain was felt to be atypical at the time. As for her lower extremity swelling, I did change her from amlodipine to HCTZ 25 mg daily.  Since the last time I saw the patient, she continued to have intermittent chest discomfort. It is occurring both with exertion and at rest. I recommended ETT for further testing. I think some of her symptom is very atypical. She does not have any significant lower extremity edema, orthopnea or PND episode.    Past Medical History:  Diagnosis Date  . Anemia   . DVT (deep venous thrombosis) ([place])    a. 11/2015 U/S: Left Peroneal DVT;  b. 02/2016 U/S: No DVT bilat.  . Essential hypertension    a. 01/2016 Echo: EF 60-65%, no rwma, triv MR/TR, PASP 32mmHg.  . Fibroid   . Pulmonary emboli ([place])    a. 11/2015 CTA  Chest: PE involving the segmental and subsegmental branches of the RML & RLL-->Xarelto.    Past Surgical History:  Procedure Laterality Date  . CESAREAN SECTION N/A 04/04/2013   Procedure: CESAREAN SECTION;  Surgeon: Emily Filbert, MD;  Location: [facility];  Service: Obstetrics;  Laterality: N/A;  . CESAREAN SECTION N/A 11/25/2015   Procedure: CESAREAN SECTION;  Surgeon: Everett Graff, MD;  Location: [facility];  Service: Obstetrics;  Laterality: N/A;  . LAPAROSCOPIC CHOLECYSTECTOMY  2010   [place], [place]  . TONSILLECTOMY      Current Medications: Outpatient Medications Prior to Visit  Medication Sig Dispense Refill  . desonide (DESOWEN) 0.05 % lotion Apply 1 application topically 3 (three) times daily.    . ferrous sulfate 325 (65 FE) MG tablet Take 1 tablet by mouth 2 (two) times daily.  1  . hydrochlorothiazide (HYDRODIURIL) 25 MG tablet Take 1 tablet (25 mg total) by mouth daily. 90 tablet 1  . amLODipine (NORVASC) 10 MG tablet Take 10 mg by mouth daily. HOLD this medication for now (as of 09/06/16)    . cyclobenzaprine (FLEXERIL) 10 MG tablet cyclobenzaprine 10 mg tablet  TAKE 1 TABLET BY MOUTH 3 TIMES PER DAY FOR 30 DAYS    . ibuprofen (ADVIL,MOTRIN) 800 MG tablet Take 1 tablet by mouth every 8 (eight) hours as needed for pain.  0   No facility-administered medications  prior to visit.      Allergies:   Patient has no known allergies.   Social History   Social History  . Marital status: Single    Spouse name: N/A  . Number of children: N/A  . Years of education: N/A   Social History Main Topics  . Smoking status: Never Smoker  . Smokeless tobacco: Never Used  . Alcohol use Yes     Comment: occassionally  . Drug use: No  . Sexual activity: Not Currently    Birth control/ protection: None   Other Topics Concern  . None   Social History Narrative   Lives in [place].  Relocated to [place] a few years ago.     Family History:  The patient's  family history includes Diabetes in her mother; Heart disease in her mother; Hypertension in her mother; Kidney disease in her mother.   ROS:   Please see the history of present illness.    ROS All other systems reviewed and are negative.   PHYSICAL EXAM:   VS:  BP 132/85   Pulse 74   Ht 5\' 4"  (1.626 m)   Wt 219 lb 12.8 oz (99.7 kg)   SpO2 99%   BMI 37.73 kg/m    GEN: Well nourished, well developed, in no acute distress  HEENT: normal  Neck: no JVD, carotid bruits, or masses Cardiac: RRR; no murmurs, rubs, or gallops,no edema  Respiratory:  clear to auscultation bilaterally, normal work of breathing GI: soft, nontender, nondistended, + BS MS: no deformity or atrophy  Skin: warm and dry, no rash Neuro:  Alert and Oriented x 3, Strength and sensation are intact Psych: euthymic mood, full affect  Wt Readings from Last 3 Encounters:  10/19/16 219 lb 12.8 oz (99.7 kg)  09/06/16 218 lb 6.4 oz (99.1 kg)  09/02/16 221 lb (100.2 kg)      Studies/Labs Reviewed:   EKG:  EKG is not ordered today.   Recent Labs: 03/29/2016: ALT 19; HGB 10.9; Platelets 339 09/15/2016: BUN 11; Creatinine, Ser 0.81; Potassium 4.4; Sodium 140   Lipid Panel No results found for: CHOL, TRIG, HDL, CHOLHDL, VLDL, LDLCALC, LDLDIRECT  Additional studies/ records that were reviewed today include:   CTA of chest 09/14/2016 IMPRESSION: 1. No acute pulmonary embolus, aortic aneurysm or dissection. 2. Minimal atelectasis in the posterior segment right upper lobe. No pneumonic consolidation or pneumothorax.    ASSESSMENT:    1. Chest pain, unspecified type   2. Essential hypertension   3. Chronic deep vein thrombosis (DVT) of proximal vein of lower extremity, unspecified laterality ([place])   4. Atypical chest pain      PLAN:  In order of problems listed above:  1. Chest pain: Continue to have intermittent chest discomfort both at rest and with exertion. We discussed various options including  observation versus GXT, we eventually decided to pursue GXT to make sure her chest pain is not ischemic in nature.  2. Hypertension: Blood pressure stable on current medication  3. History of DVT/PE: Recent CTA of the chest was negative despite elevated d-dimer.    Medication Adjustments/Labs and Tests Ordered: Current medicines are reviewed at length with the patient today.  Concerns regarding medicines are outlined above.  Medication changes, Labs and Tests ordered today are listed in the Patient Instructions below. Patient Instructions  Medication Instructions:   No changes  Labwork:   None  Testing/Procedures:  Your physician has requested that you have an exercise tolerance test. For further  information please visit [email or web address]. Please also follow instruction sheet, as given.   Follow-Up:  6 months with Dr. Stanford Breed   If you need a refill on your cardiac medications before your next appointment, please call your pharmacy.      Hilbert Corrigan, [place]  10/21/2016 1:04 PM    [facility] [place], [place], [place]  [address] Phone: [phone]; Fax: [phone]

## 2016-10-19 NOTE — Patient Instructions (Signed)
Medication Instructions:   No changes  Labwork:   None  Testing/Procedures:  Your physician has requested that you have an exercise tolerance test. For further information please visit [email or web address]. Please also follow instruction sheet, as given.   Follow-Up:  6 months with Dr. Stanford Breed   If you need a refill on your cardiac medications before your next appointment, please call your pharmacy.

## 2016-10-21 ENCOUNTER — Encounter: Payer: Self-pay | Admitting: Physician Assistant

## 2016-10-25 ENCOUNTER — Telehealth (HOSPITAL_COMMUNITY): Payer: Self-pay

## 2016-10-25 NOTE — Telephone Encounter (Signed)
Encounter complete. 

## 2016-10-26 ENCOUNTER — Telehealth (HOSPITAL_COMMUNITY): Payer: Self-pay

## 2016-10-26 NOTE — Telephone Encounter (Signed)
Encounter complete. 

## 2016-10-27 ENCOUNTER — Ambulatory Visit (HOSPITAL_COMMUNITY)
Admission: RE | Admit: 2016-10-27 | Discharge: 2016-10-27 | Disposition: A | Discharge status: Home / Self Care | Payer: Medicaid Other | Source: Ambulatory Visit | Attending: Cardiology | Admitting: Cardiology

## 2016-10-27 DIAGNOSIS — I9589 Other hypotension: Secondary | ICD-10-CM | POA: Diagnosis not present

## 2016-10-27 DIAGNOSIS — R0789 Other chest pain: Secondary | ICD-10-CM

## 2016-10-27 LAB — EXERCISE TOLERANCE TEST
CHL CUP MPHR: 183 {beats}/min
CHL CUP RESTING HR STRESS: 72 {beats}/min
CSEPEW: 10.2 METS
CSEPPHR: 169 {beats}/min
Exercise duration (min): 9 min
Exercise duration (sec): 6 s
Percent HR: 92 %
RPE: 18

## 2016-10-28 ENCOUNTER — Telehealth: Payer: Self-pay | Admitting: *Deleted

## 2016-10-28 DIAGNOSIS — R9430 Abnormal result of cardiovascular function study, unspecified: Secondary | ICD-10-CM

## 2016-10-28 DIAGNOSIS — R079 Chest pain, unspecified: Secondary | ICD-10-CM

## 2016-10-28 NOTE — Progress Notes (Signed)
I have called the patient and discussed the result of GXT myself. As discussed with Dr. Stanford Breed, we recommend a stress echo, will ask staff to arrange. She will be expecting a phone call from our scheduler to study the study

## 2016-10-28 NOTE — Telephone Encounter (Signed)
-----   Message from [place], [place] sent at 10/28/2016 10:50 AM EDT ----- I have called the patient and discussed the result of GXT myself. As discussed with Dr. Stanford Breed, we recommend a stress echo, will ask staff to arrange. She will be expecting a phone call from our scheduler to study the study

## 2016-11-02 NOTE — Telephone Encounter (Signed)
Follow Up    Patient is calling to follow up on tests. Wants to know if there are in precautions she needs to take. Requesting call back

## 2016-11-04 NOTE — Telephone Encounter (Signed)
Follow up     Pt is returning call to nathan.

## 2016-11-04 NOTE — Telephone Encounter (Signed)
Left msg to call.

## 2016-11-04 NOTE — Telephone Encounter (Signed)
Pt states that her ECHO is scheduled for 11-29-16 she is asking if it is ok to exercise until then, she exercises on a treadmill and is concerned about exercising. Pt notified that I have to defer  for further direction from Dr Stanford Breed.   Dr Stanford Breed has left for the day will have to wait until Monday.   Pt notified she will await Dr Jacalyn Lefevre return on Monday.

## 2016-11-06 NOTE — Telephone Encounter (Signed)
Light activity is ok, but no strenuous activity until stress echo.

## 2016-11-07 NOTE — Telephone Encounter (Signed)
Spoke with pt, aware of recommendations. 

## 2016-11-24 ENCOUNTER — Telehealth (HOSPITAL_COMMUNITY): Payer: Self-pay | Admitting: *Deleted

## 2016-11-24 NOTE — Telephone Encounter (Signed)
Left message on voicemail per DPR in reference to upcoming appointment scheduled on 11/29/16 at 2:30 with detailed instructions given per Stress Test Requisition Sheet for the test. LM to arrive 30 minutes early, and that it is imperative to arrive on time for appointment to keep from having the test rescheduled. If you need to cancel or reschedule your appointment, please call the office within 24 hours of your appointment. Failure to do so may result in a cancellation of your appointment, and a $50 no show fee. Phone number given for call back for any questions. Brittney Sandoval

## 2016-11-29 ENCOUNTER — Other Ambulatory Visit (HOSPITAL_COMMUNITY): Payer: Self-pay

## 2017-03-02 ENCOUNTER — Telehealth (HOSPITAL_COMMUNITY): Payer: Self-pay | Admitting: *Deleted

## 2017-03-02 NOTE — Telephone Encounter (Signed)
Left message on voicemail in reference to upcoming appointment scheduled for 03/07/17. Phone number given for a call back so details instructions can be given. Veronia Beets

## 2017-03-07 ENCOUNTER — Ambulatory Visit (HOSPITAL_BASED_OUTPATIENT_CLINIC_OR_DEPARTMENT_OTHER): Payer: BLUE CROSS/BLUE SHIELD

## 2017-03-07 ENCOUNTER — Ambulatory Visit (HOSPITAL_COMMUNITY): Payer: BLUE CROSS/BLUE SHIELD | Attending: Cardiology

## 2017-03-07 DIAGNOSIS — R9430 Abnormal result of cardiovascular function study, unspecified: Secondary | ICD-10-CM | POA: Diagnosis present

## 2017-03-07 DIAGNOSIS — R079 Chest pain, unspecified: Secondary | ICD-10-CM

## 2017-03-08 NOTE — Progress Notes (Signed)
Low risk stress test. Continue exercise as much as she can

## 2017-03-15 ENCOUNTER — Telehealth: Payer: Self-pay | Admitting: Cardiology

## 2017-03-15 NOTE — Telephone Encounter (Signed)
Patient made aware of results and verbalized her understanding.  Notes recorded by Almyra Deforest, PA on 03/08/2017 at 5:31 PM EST Low risk stress test. Continue exercise as much as she can

## 2017-03-15 NOTE — Telephone Encounter (Signed)
New message   Patient returning call regarding echo results Please call

## 2017-04-06 ENCOUNTER — Other Ambulatory Visit: Payer: Self-pay | Admitting: Physician Assistant

## 2017-04-06 NOTE — Telephone Encounter (Signed)
Please review for refill, Thanks !  

## 2017-04-06 NOTE — Telephone Encounter (Signed)
This is Dr. Crenshaw's pt 

## 2017-04-07 NOTE — Telephone Encounter (Signed)
REFILL 

## 2017-06-19 NOTE — Progress Notes (Signed)
HPI: Follow-up hypertension and pulmonary embolus. Patient delivered by C-section on 11/25/2015. She was found to have a pulmonary embolus in October 2017 and there was note of DVT on lower extremity venous Dopplers. She was treated with xarelto. Echocardiogram December 2017 showed normal LV systolic function. Chest CTA 7/18 showed no pulmonary embolus. ETT August 2018 felt to be abnormal with ST depression in the inferior lateral leads with hypotensive response.  However stress echocardiogram January 2019 normal.  Since last seen, the patient has dyspnea with more extreme activities but not with routine activities. It is relieved with rest. It is not associated with chest pain. There is no orthopnea, PND or pedal edema. There is no syncope or palpitations. There is no exertional chest pain.    Current Outpatient Medications  Medication Sig Dispense Refill  . desonide (DESOWEN) 0.05 % lotion Apply 1 application topically 3 (three) times daily.    . ferrous sulfate 325 (65 FE) MG tablet Take 1 tablet by mouth 2 (two) times daily.  1  . hydrochlorothiazide (HYDRODIURIL) 25 MG tablet Take 1 tablet (25 mg total) by mouth daily. NEED OV. 30 tablet 2   No current facility-administered medications for this visit.      Past Medical History:  Diagnosis Date  . Anemia   . DVT (deep venous thrombosis) ([place])    a. 11/2015 U/S: Left Peroneal DVT;  b. 02/2016 U/S: No DVT bilat.  . Essential hypertension    a. 01/2016 Echo: EF 60-65%, no rwma, triv MR/TR, PASP 42mmHg.  . Fibroid   . Pulmonary emboli ([place])    a. 11/2015 CTA Chest: PE involving the segmental and subsegmental branches of the RML & RLL-->Xarelto.    Past Surgical History:  Procedure Laterality Date  . CESAREAN SECTION N/A 04/04/2013   Procedure: CESAREAN SECTION;  Surgeon: Emily Filbert, MD;  Location: [facility];  Service: Obstetrics;  Laterality: N/A;  . CESAREAN SECTION N/A 11/25/2015   Procedure: CESAREAN SECTION;  Surgeon: Everett Graff, MD;  Location: [facility];  Service: Obstetrics;  Laterality: N/A;  . LAPAROSCOPIC CHOLECYSTECTOMY  2010   [place], [place]  . TONSILLECTOMY      Social History   Socioeconomic History  . Marital status: Single    Spouse name: Not on file  . Number of children: Not on file  . Years of education: Not on file  . Highest education level: Not on file  Occupational History  . Not on file  Social Needs  . Financial resource strain: Not on file  . Food insecurity:    Worry: Not on file    Inability: Not on file  . Transportation needs:    Medical: Not on file    Non-medical: Not on file  Tobacco Use  . Smoking status: Never Smoker  . Smokeless tobacco: Never Used  Substance and Sexual Activity  . Alcohol use: Yes    Comment: occassionally  . Drug use: No  . Sexual activity: Not Currently    Birth control/protection: None  Lifestyle  . Physical activity:    Days per week: Not on file    Minutes per session: Not on file  . Stress: Not on file  Relationships  . Social connections:    Talks on phone: Not on file    Gets together: Not on file    Attends religious service: Not on file    Active member of club or organization: Not on file  Attends meetings of clubs or organizations: Not on file    Relationship status: Not on file  . Intimate partner violence:    Fear of current or ex partner: Not on file    Emotionally abused: Not on file    Physically abused: Not on file    Forced sexual activity: Not on file  Other Topics Concern  . Not on file  Social History Narrative   Lives in [place].  Relocated to [place] a few years ago.    Family History  Problem Relation Age of Onset  . Diabetes Mother   . Hypertension Mother   . Kidney disease Mother   . Heart disease Mother     ROS: no fevers or chills, productive cough, hemoptysis, dysphasia, odynophagia, melena, hematochezia, dysuria, hematuria, rash, seizure activity, orthopnea,  PND, pedal edema, claudication. Remaining systems are negative.  Physical Exam: Well-developed well-nourished in no acute distress.  Skin is warm and dry.  HEENT is normal.  Neck is supple.  Chest is clear to auscultation with normal expansion.  Cardiovascular exam is regular rate and rhythm.  Abdominal exam nontender or distended. No masses palpated. Extremities show no edema. neuro grossly intact  ECG-normal sinus rhythm at a rate of 68.  First-degree AV block.  No ST changes.  Personally reviewed  A/P  1 hypertension-blood pressure is mildly elevated.  I have asked her to follow this at home and her regimen can be advanced as tolerated.  We also discussed weight loss.  2 chest pain-stress echocardiogram shows no ischemia.  No plans for further evaluation at this point.  3 history of pulmonary embolus-patient completed Xarelto.  [name], MD

## 2017-06-26 ENCOUNTER — Encounter: Payer: Self-pay | Admitting: Cardiology

## 2017-06-26 ENCOUNTER — Encounter

## 2017-06-26 ENCOUNTER — Ambulatory Visit (INDEPENDENT_AMBULATORY_CARE_PROVIDER_SITE_OTHER): Payer: BLUE CROSS/BLUE SHIELD | Admitting: Cardiology

## 2017-06-26 VITALS — BP 140/88 | HR 68 | Ht 64.0 in | Wt 213.4 lb

## 2017-06-26 DIAGNOSIS — I10 Essential (primary) hypertension: Secondary | ICD-10-CM

## 2017-06-26 DIAGNOSIS — R072 Precordial pain: Secondary | ICD-10-CM | POA: Diagnosis not present

## 2017-06-26 NOTE — Patient Instructions (Signed)
Your physician recommends that you schedule a follow-up appointment in: AS NEEDED  

## 2017-07-14 ENCOUNTER — Other Ambulatory Visit: Payer: Self-pay | Admitting: Physician Assistant

## 2017-09-20 ENCOUNTER — Other Ambulatory Visit: Payer: Self-pay | Admitting: Cardiology

## 2017-10-05 ENCOUNTER — Other Ambulatory Visit: Payer: Self-pay | Admitting: Cardiology

## 2017-10-06 ENCOUNTER — Other Ambulatory Visit: Payer: Self-pay | Admitting: Cardiology

## 2018-01-31 ENCOUNTER — Telehealth (HOSPITAL_COMMUNITY): Payer: Self-pay

## 2018-01-31 NOTE — Telephone Encounter (Signed)
Returned patient's phone call. Left her a message to call back to get scheduled for [facility].

## 2018-02-07 ENCOUNTER — Other Ambulatory Visit (HOSPITAL_COMMUNITY): Payer: Self-pay | Admitting: *Deleted

## 2018-02-07 DIAGNOSIS — N644 Mastodynia: Secondary | ICD-10-CM

## 2018-03-01 ENCOUNTER — Other Ambulatory Visit: Payer: Self-pay

## 2018-03-01 ENCOUNTER — Ambulatory Visit (HOSPITAL_COMMUNITY)
Admission: RE | Admit: 2018-03-01 | Discharge: 2018-03-01 | Disposition: A | Discharge status: Home / Self Care | Payer: Medicaid Other | Source: Ambulatory Visit | Attending: Obstetrics and Gynecology | Admitting: Obstetrics and Gynecology

## 2018-03-01 ENCOUNTER — Encounter (HOSPITAL_COMMUNITY): Payer: Self-pay

## 2018-03-01 ENCOUNTER — Ambulatory Visit (HOSPITAL_COMMUNITY)
Admission: EM | Admit: 2018-03-01 | Discharge: 2018-03-01 | Disposition: A | Discharge status: Nursing Home (Includes ICF) | Payer: BLUE CROSS/BLUE SHIELD | Attending: Family Medicine | Admitting: Family Medicine

## 2018-03-01 ENCOUNTER — Ambulatory Visit
Admission: RE | Admit: 2018-03-01 | Discharge: 2018-03-01 | Disposition: A | Discharge status: Home / Self Care | Payer: BLUE CROSS/BLUE SHIELD | Source: Ambulatory Visit | Attending: Obstetrics and Gynecology | Admitting: Obstetrics and Gynecology

## 2018-03-01 ENCOUNTER — Emergency Department (HOSPITAL_COMMUNITY)
Admission: EM | Admit: 2018-03-01 | Discharge: 2018-03-01 | Disposition: A | Discharge status: Home / Self Care | Payer: BLUE CROSS/BLUE SHIELD | Attending: Emergency Medicine | Admitting: Emergency Medicine

## 2018-03-01 ENCOUNTER — Ambulatory Visit
Admission: RE | Admit: 2018-03-01 | Discharge: 2018-03-01 | Disposition: A | Discharge status: Home / Self Care | Payer: Self-pay | Source: Ambulatory Visit | Attending: Obstetrics and Gynecology | Admitting: Obstetrics and Gynecology

## 2018-03-01 VITALS — BP 128/70 | Ht 64.0 in | Wt 216.0 lb

## 2018-03-01 DIAGNOSIS — M79602 Pain in left arm: Secondary | ICD-10-CM

## 2018-03-01 DIAGNOSIS — M79622 Pain in left upper arm: Secondary | ICD-10-CM | POA: Insufficient documentation

## 2018-03-01 DIAGNOSIS — R0789 Other chest pain: Secondary | ICD-10-CM | POA: Diagnosis not present

## 2018-03-01 DIAGNOSIS — Z79899 Other long term (current) drug therapy: Secondary | ICD-10-CM | POA: Diagnosis not present

## 2018-03-01 DIAGNOSIS — I10 Essential (primary) hypertension: Secondary | ICD-10-CM | POA: Insufficient documentation

## 2018-03-01 DIAGNOSIS — Z1239 Encounter for other screening for malignant neoplasm of breast: Secondary | ICD-10-CM

## 2018-03-01 DIAGNOSIS — R1010 Upper abdominal pain, unspecified: Secondary | ICD-10-CM | POA: Diagnosis not present

## 2018-03-01 DIAGNOSIS — N644 Mastodynia: Secondary | ICD-10-CM

## 2018-03-01 DIAGNOSIS — R079 Chest pain, unspecified: Secondary | ICD-10-CM | POA: Diagnosis present

## 2018-03-01 LAB — CBC
HEMATOCRIT: 32.7 % — AB (ref 36.0–46.0)
HEMOGLOBIN: 9.3 g/dL — AB (ref 12.0–15.0)
MCH: 17.4 pg — ABNORMAL LOW (ref 26.0–34.0)
MCHC: 28.4 g/dL — ABNORMAL LOW (ref 30.0–36.0)
MCV: 61 fL — ABNORMAL LOW (ref 80.0–100.0)
Platelets: 310 10*3/uL (ref 150–400)
RBC: 5.36 MIL/uL — ABNORMAL HIGH (ref 3.87–5.11)
RDW: 20 % — ABNORMAL HIGH (ref 11.5–15.5)
WBC: 11.4 10*3/uL — ABNORMAL HIGH (ref 4.0–10.5)
nRBC: 0 % (ref 0.0–0.2)

## 2018-03-01 LAB — COMPREHENSIVE METABOLIC PANEL
ALT: 17 U/L (ref 0–44)
AST: 20 U/L (ref 15–41)
Albumin: 4.1 g/dL (ref 3.5–5.0)
Alkaline Phosphatase: 56 U/L (ref 38–126)
Anion gap: 10 (ref 5–15)
BUN: 10 mg/dL (ref 6–20)
CO2: 20 mmol/L — ABNORMAL LOW (ref 22–32)
Calcium: 9.4 mg/dL (ref 8.9–10.3)
Chloride: 107 mmol/L (ref 98–111)
Creatinine, Ser: 0.72 mg/dL (ref 0.44–1.00)
GFR calc Af Amer: >60 mL/min (ref >60)
GFR calc non Af Amer: >60 mL/min (ref >60)
Glucose, Bld: 95 mg/dL (ref 70–99)
POTASSIUM: 4.4 mmol/L (ref 3.5–5.1)
SODIUM: 137 mmol/L (ref 135–145)
Total Bilirubin: 0.2 mg/dL — ABNORMAL LOW (ref 0.3–1.2)
Total Protein: 8.2 g/dL — ABNORMAL HIGH (ref 6.5–8.1)

## 2018-03-01 LAB — I-STAT TROPONIN, ED: Troponin i, poc: 0.01 ng/mL (ref 0.00–0.08)

## 2018-03-01 LAB — I-STAT BETA HCG BLOOD, ED (MC, WL, AP ONLY): I-stat hCG, quantitative: <5 m[IU]/mL (ref <5)

## 2018-03-01 LAB — LIPASE, BLOOD: Lipase: 25 U/L (ref 11–51)

## 2018-03-01 NOTE — ED Notes (Signed)
Spoke with Loura Halt, NP about pt's symptoms.  She ordered an EKG and looked at EKG before asking [place] to send pt to the ED for further evaluation for the upper abdominal pain, 10/10.  Pt was agreeable to going to ED and her boyfriend was here to escort her to the ED.  Pt was A&O w/ VSS, except for elevated respirations from the pain.

## 2018-03-01 NOTE — ED Triage Notes (Signed)
Pt reports pain in her left arm when she woke up this morning along with epigastric abd pain and nausea.

## 2018-03-01 NOTE — ED Notes (Signed)
Pt ambulated to exam room with steady gait.

## 2018-03-01 NOTE — ED Provider Notes (Signed)
[facility] EMERGENCY DEPARTMENT Provider Note   CSN: [id number] Arrival date & time: 03/01/18  1345     History   Chief Complaint Chief Complaint  Patient presents with  . Chest Pain  . Abdominal Pain    HPI Brittney Sandoval is a 38 y.o. female.  She is a prior history of DVT PE.  She said she woke up this morning with some pain in her left biceps into her shoulder.  She went to a scheduled mammogram appointment today and also experienced some subxiphoid pain that radiated under her left breast.  She felt constipated and then move her bowels and then has had a little bit of diarrhea since then.  She is a little bit of nausea.  Her abdominal discomfort is improved but her arm discomfort still remains.  The history is provided by the patient.  Chest Pain   This is a new problem. The current episode started 6 to 12 hours ago. The problem occurs constantly. The problem has been gradually improving. The pain is present in the epigastric region. The pain is severe. The quality of the pain is described as pressure-like. The pain radiates to the left arm. Associated symptoms include abdominal pain, nausea and vomiting. Pertinent negatives include no cough, no diaphoresis, no fever, no headaches, no leg pain, no lower extremity edema, no numbness, no shortness of breath and no syncope. She has tried nothing for the symptoms. The treatment provided moderate relief.  Her past medical history is significant for PE.  Abdominal Pain   Associated symptoms include nausea and vomiting. Pertinent negatives include fever, dysuria and headaches.    Past Medical History:  Diagnosis Date  . Anemia   . DVT (deep venous thrombosis) ([place])    a. 11/2015 U/S: Left Peroneal DVT;  b. 02/2016 U/S: No DVT bilat.  . Essential hypertension    a. 01/2016 Echo: EF 60-65%, no rwma, triv MR/TR, PASP 39mmHg.  . Fibroid   . Pulmonary emboli ([place])    a. 11/2015 CTA Chest: PE involving the segmental  and subsegmental branches of the RML & RLL-->Xarelto.    Patient Active Problem List   Diagnosis Date Noted  . Uncontrolled hypertension 01/18/2016  . Acute pulmonary embolism ([place]) 12/15/2015  . Anemia 11/27/2015  . S/P repeat low transverse C-section 11/26/2015  . Advanced maternal age in multigravida   . Sickle cell trait ([place]) 12/03/2012  . Benign essential hypertension, antepartum 12/03/2012  . 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 Obesity (BMI 30-39.9) 10/24/2012    Past Surgical History:  Procedure Laterality Date  . CESAREAN SECTION N/A 04/04/2013   Procedure: CESAREAN SECTION;  Surgeon: Emily Filbert, MD;  Location: [facility];  Service: Obstetrics;  Laterality: N/A;  . CESAREAN SECTION N/A 11/25/2015   Procedure: CESAREAN SECTION;  Surgeon: Everett Graff, MD;  Location: [facility];  Service: Obstetrics;  Laterality: N/A;  . LAPAROSCOPIC CHOLECYSTECTOMY  2010   [place], [place]  . TONSILLECTOMY       OB History    Gravida  5   Para  5   Term  5   Preterm      AB      Living  5     SAB      TAB      Ectopic      Multiple  0   Live Births  5  Home Medications    Prior to Admission medications   Medication Sig Start Date End Date Taking? Authorizing Provider  amLODipine (NORVASC) 10 MG tablet TAKE 1 TABLET (10 MG TOTAL) BY MOUTH DAILY. 10/10/17 01/08/18  Lelon Perla, MD  desonide (DESOWEN) 0.05 % lotion Apply 1 application topically 3 (three) times daily.    [provider]  ferrous sulfate 325 (65 FE) MG tablet Take 1 tablet by mouth 2 (two) times daily. 10/28/15   [provider]  hydrochlorothiazide (HYDRODIURIL) 25 MG tablet Take 1 tablet (25 mg total) by mouth daily. Patient not taking: Reported on 03/01/2018 07/17/17   Lelon Perla, MD    Family History Family History  Problem Relation Age of Onset  . Diabetes Mother   .  Hypertension Mother   . Kidney disease Mother   . Heart disease Mother     Social History Social History   Tobacco Use  . Smoking status: Never Smoker  . Smokeless tobacco: Never Used  Substance Use Topics  . Alcohol use: Yes    Comment: occassionally  . Drug use: No     Allergies   Patient has no known allergies.   Review of Systems Review of Systems  Constitutional: Negative for diaphoresis and fever.  HENT: Negative for sore throat.   Eyes: Negative for visual disturbance.  Respiratory: Negative for cough and shortness of breath.   Cardiovascular: Positive for chest pain. Negative for syncope.  Gastrointestinal: Positive for abdominal pain, nausea and vomiting.  Genitourinary: Negative for dysuria.  Musculoskeletal: Negative for neck pain.  Skin: Negative for rash.  Neurological: Negative for numbness and headaches.     Physical Exam Updated Vital Signs BP (!) 143/75 (BP Location: Right Arm)   Pulse 70   Temp 98.9 F (37.2 C) (Oral)   Resp 18   SpO2 99%   Physical Exam Vitals signs and nursing note reviewed.  Constitutional:      General: She is not in acute distress.    Appearance: She is well-developed.  HENT:     Head: Normocephalic and atraumatic.  Eyes:     Conjunctiva/sclera: Conjunctivae normal.  Neck:     Musculoskeletal: Neck supple.  Cardiovascular:     Rate and Rhythm: Normal rate and regular rhythm.     Heart sounds: Normal heart sounds. No murmur.  Pulmonary:     Effort: Pulmonary effort is normal. No respiratory distress.     Breath sounds: Normal breath sounds.  Abdominal:     Palpations: Abdomen is soft.     Tenderness: There is no abdominal tenderness.  Musculoskeletal: Normal range of motion.     Right lower leg: She exhibits no tenderness. No edema.     Left lower leg: She exhibits no tenderness. No edema.  Skin:    General: Skin is warm and dry.     Capillary Refill: Capillary refill takes less than 2 seconds.    Neurological:     General: No focal deficit present.     Mental Status: She is alert and oriented to person, place, and time.     Motor: No weakness.      ED Treatments / Results  Labs (all labs ordered are listed, but only abnormal results are displayed) Labs Reviewed  COMPREHENSIVE METABOLIC PANEL - Abnormal; Notable for the following components:      Result Value   CO2 20 (*)    Total Protein 8.2 (*)    Total Bilirubin 0.2 (*)  All other components within normal limits  CBC - Abnormal; Notable for the following components:   WBC 11.4 (*)    RBC 5.36 (*)    Hemoglobin 9.3 (*)    HCT 32.7 (*)    MCV 61.0 (*)    MCH 17.4 (*)    MCHC 28.4 (*)    RDW 20.0 (*)    All other components within normal limits  LIPASE, BLOOD  URINALYSIS, ROUTINE W REFLEX MICROSCOPIC  I-STAT BETA HCG BLOOD, ED (MC, WL, AP ONLY)  I-STAT TROPONIN, ED    EKG EKG Interpretation  Date/Time:  Thursday March 01 2018 14:30:54 EST Ventricular Rate:  78 PR Interval:  194 QRS Duration: 102 QT Interval:  378 QTC Calculation: 430 R Axis:   52 Text Interpretation:  Normal sinus rhythm Normal ECG similar to prior today 1/20 Confirmed by Aletta Edouard [phone]) on 03/01/2018 5:23:43 PM   Radiology Ms Digital Diag Tomo Bilat  Result Date: 03/01/2018 CLINICAL DATA:  Patient describes focal pain within the outer LEFT breast, increasingly persistent. EXAM: DIGITAL DIAGNOSTIC BILATERAL MAMMOGRAM WITH CAD AND TOMO COMPARISON:  Previous exam(s). ACR Breast Density Category c: The breast tissue is heterogeneously dense, which may obscure small masses. FINDINGS: There are no new dominant masses, suspicious calcifications or secondary signs of malignancy within either breast. Specifically, there is no mammographic abnormality within the outer LEFT breast, corresponding to the area of clinical concern with overlying skin marker in place. Mammographic images were processed with CAD. Patient was ill and unable to stay  today for a diagnostic ultrasound of the LEFT breast. IMPRESSION: 1. No mammographic evidence of malignancy within either breast. 2. Incomplete exam. Per protocol, ultrasound is recommended for the focal pain in the outer LEFT breast. Patient will be rescheduled for ultrasound when she is physically able. RECOMMENDATION: Diagnostic LEFT breast ultrasound for focal pain. I have discussed the findings and recommendations with the patient. Results were also provided in writing at the conclusion of the visit. If applicable, a reminder letter will be sent to the patient regarding the next appointment. BI-RADS CATEGORY  0: Incomplete. Need additional imaging evaluation and/or prior mammograms for comparison. Electronically Signed   By: Franki Cabot M.D.   On: 03/01/2018 12:58    Procedures Procedures (including critical care time)  Medications Ordered in ED Medications - No data to display   Initial Impression / Assessment and Plan / ED Course  I have reviewed the triage vital signs and the nursing notes.  Pertinent labs & imaging results that were available during my care of the patient were reviewed by me and considered in my medical decision making (see chart for details).  Clinical Course as of Mar 02 905  Thu Mar 01, 2018  1727 Patient's labs significant for anemia but similar to her prior.  Her troponin is normal.  Her EKG is unremarkable.  She has had her gallbladder out and her LFTs are normal.  Patient says her pain is improved.   [MB]  1728 She says she was diagnosed with a PE in the past and she did never have any shortness of breath or chest pain at that time.  There was just some arm discomfort.  Was a lower extremity DVT that caused her PE.  She is off anticoagulation now.   [MB]  2563 I offer the patient to undergo a CT chest to exclude that she has a PE although her vital signs and oxygen saturations do not make me that suspicious.  She has decided that she would rather be discharged  and follow-up with her doctor.  She understands to return if any worsening symptoms.  She said she had a follow-up CT after her treatment because she had a positive d-dimer and so she is afraid of another CAT scan unless she needs 1.   [MB]    Clinical Course User Index [MB] Hayden Rasmussen, MD     Final Clinical Impressions(s) / ED Diagnoses   Final diagnoses:  Left arm pain  Atypical chest pain    ED Discharge Orders    None       Hayden Rasmussen, MD 03/02/18 [phone]

## 2018-03-01 NOTE — Progress Notes (Signed)
Complaints of left outer breast pain x 5 months that comes and goes. Patient rates the pain at a 7 out of 10.  Pap Smear: Pap smear not completed today. Last Pap smear was in September or October 2017 at [facility] and normal per patient. Per patient has no history of an abnormal Pap smear. No Pap smear results are in Epic.  Physical exam: Breasts Breasts symmetrical. No skin abnormalities bilateral breasts. No nipple retraction bilateral breasts. No nipple discharge bilateral breasts. No lymphadenopathy. No lumps palpated bilateral breasts. Complaints of left lower outer breast tenderness on exam. Referred patient to the [facility] for a diagnostic mammogram. Appointment scheduled for Thursday, March 01, 2018 at 1120.        Pelvic/Bimanual No Pap smear completed today since last Pap smear was in September or October 2017 per patient. Pap smear not indicated per BCCCP guidelines.   Smoking History: Patient has never smoked.  Patient Navigation: Patient education provided. Access to services provided for patient through [facility] program.   Breast and Cervical Cancer Risk Assessment: Patient has a family history of a half-sister having breast cancer. Patient has no known genetic mutations or history of radiation treatment to the chest before age 26. Patient has no history of cervical dysplasia, immunocompromised, or DES exposure in-utero.  Risk Assessment    Risk Scores      03/01/2018   Last edited by: Loletta Parish, RN   5-year risk: 0.8 %   Lifetime risk: 15 %

## 2018-03-01 NOTE — ED Triage Notes (Signed)
Pt reports upper abdominal pain that starts centrally and radiates to both sides under her ribs.  She reports the pain is very similar to when she had gall stones a few years ago, but pt has had her gallbladder removed.  Pt also reports what feels like a pulled muscle in her left upper triceps area that she woke up with and then the abdominal pain began.  She denies any changes in vision or headache, no jaw pain or chest pain and no SOB.  She does state that she was unable to have a BM this morning but then when she did finally go, she was having diarrhea.  She also reports nausea with this pain and one episode of vomiting.

## 2018-03-01 NOTE — Patient Instructions (Signed)
Explained breast self awareness with Janna Arch. Patient did not need a Pap smear today due to last Pap smear was in September or October 2017 per patient. Let her know BCCCP will cover Pap smears every 3 years unless has a history of abnormal Pap smears. Referred patient to the [facility] for a diagnostic mammogram. Appointment scheduled for Thursday, March 01, 2018 at 1120. Patient aware of appointment and will be there. Janna Arch verbalized understanding.  Georga Stys, Arvil Ch[place], RN 12:04 PM

## 2018-03-01 NOTE — Discharge Instructions (Addendum)
You were evaluated in the emergency department for left arm and epigastric upper abdominal pain that radiated under your left breast.  You had an EKG and blood work that did not show an obvious cause of your symptoms.  We discussed doing a CAT scan to rule out a pulmonary embolism but you did not want to get the test at this time.  Please follow-up with your regular doctor and return if any worsening symptoms.

## 2018-03-01 NOTE — ED Notes (Signed)
Discharge instructions discussed with Pt by RN Myriam Jacobson. Pt verbalized understanding. Pt stable and ambulatory.

## 2018-03-02 ENCOUNTER — Encounter (HOSPITAL_COMMUNITY): Payer: Self-pay | Admitting: *Deleted

## 2018-03-23 ENCOUNTER — Inpatient Hospital Stay: Admission: RE | Admit: 2018-03-23 | Payer: BLUE CROSS/BLUE SHIELD | Source: Ambulatory Visit

## 2018-04-10 ENCOUNTER — Other Ambulatory Visit: Payer: Self-pay | Admitting: Cardiology

## 2018-04-23 ENCOUNTER — Ambulatory Visit
Admission: RE | Admit: 2018-04-23 | Discharge: 2018-04-23 | Disposition: A | Discharge status: Home / Self Care | Payer: No Typology Code available for payment source | Source: Ambulatory Visit | Attending: Obstetrics and Gynecology | Admitting: Obstetrics and Gynecology

## 2018-04-23 DIAGNOSIS — N644 Mastodynia: Secondary | ICD-10-CM

## 2018-09-24 ENCOUNTER — Other Ambulatory Visit: Payer: Self-pay | Admitting: *Deleted

## 2018-09-24 DIAGNOSIS — Z20822 Contact with and (suspected) exposure to covid-19: Secondary | ICD-10-CM

## 2018-09-28 LAB — NOVEL CORONAVIRUS, NAA: SARS-CoV-2, NAA: NOT DETECTED

## 2018-11-21 ENCOUNTER — Other Ambulatory Visit: Payer: Self-pay | Admitting: Cardiology

## 2018-12-05 ENCOUNTER — Other Ambulatory Visit: Payer: Self-pay | Admitting: Cardiology

## 2018-12-05 NOTE — Telephone Encounter (Signed)
Pt overdue for future appointment. Please contact pt for future appointment.

## 2019-05-06 ENCOUNTER — Other Ambulatory Visit: Payer: Self-pay | Admitting: Cardiology

## 2019-05-07 ENCOUNTER — Other Ambulatory Visit: Payer: Self-pay

## 2019-05-08 ENCOUNTER — Other Ambulatory Visit: Payer: Self-pay

## 2019-05-10 DIAGNOSIS — R5382 Chronic fatigue, unspecified: Secondary | ICD-10-CM | POA: Diagnosis not present

## 2019-05-10 DIAGNOSIS — R202 Paresthesia of skin: Secondary | ICD-10-CM | POA: Diagnosis not present

## 2019-05-10 DIAGNOSIS — R200 Anesthesia of skin: Secondary | ICD-10-CM | POA: Diagnosis not present

## 2019-05-22 ENCOUNTER — Other Ambulatory Visit: Payer: Self-pay | Admitting: Physician Assistant

## 2019-05-22 DIAGNOSIS — Z1231 Encounter for screening mammogram for malignant neoplasm of breast: Secondary | ICD-10-CM

## 2019-05-23 ENCOUNTER — Telehealth: Payer: Self-pay

## 2019-05-23 ENCOUNTER — Other Ambulatory Visit: Payer: Self-pay

## 2019-05-23 ENCOUNTER — Telehealth (INDEPENDENT_AMBULATORY_CARE_PROVIDER_SITE_OTHER): Payer: Medicaid Other | Admitting: Physician Assistant

## 2019-05-23 ENCOUNTER — Encounter: Payer: Self-pay | Admitting: Physician Assistant

## 2019-05-23 ENCOUNTER — Telehealth: Payer: Self-pay | Admitting: Radiology

## 2019-05-23 VITALS — HR 76 | Temp 97.3°F | Ht 64.0 in | Wt 227.0 lb

## 2019-05-23 DIAGNOSIS — Z86711 Personal history of pulmonary embolism: Secondary | ICD-10-CM

## 2019-05-23 DIAGNOSIS — I2699 Other pulmonary embolism without acute cor pulmonale: Secondary | ICD-10-CM

## 2019-05-23 DIAGNOSIS — R0789 Other chest pain: Secondary | ICD-10-CM | POA: Diagnosis not present

## 2019-05-23 DIAGNOSIS — I10 Essential (primary) hypertension: Secondary | ICD-10-CM

## 2019-05-23 DIAGNOSIS — R002 Palpitations: Secondary | ICD-10-CM | POA: Diagnosis not present

## 2019-05-23 NOTE — Telephone Encounter (Signed)
Enrolled patient for a 14 day Zio monitor to be mailed to patients home.  

## 2019-05-23 NOTE — Progress Notes (Signed)
Virtual Visit via Telephone Note   This visit type was conducted due to national recommendations for restrictions regarding the COVID-19 Pandemic (e.g. social distancing) in an effort to limit this patient's exposure and mitigate transmission in our community.  Due to her co-morbid illnesses, this patient is at least at moderate risk for complications without adequate follow up.  This format is felt to be most appropriate for this patient at this time.  The patient did not have access to video technology/had technical difficulties with video requiring transitioning to audio format only (telephone).  All issues noted in this document were discussed and addressed.  No physical exam could be performed with this format.  Please refer to the patient's chart for her  consent to telehealth for [facility].   The patient was identified using 2 identifiers.  Date:  05/23/2019   ID:  Brittney Sandoval, DOB Dec 15, 1980, MRN [id number]  Patient Location: Home Provider Location: Home  PCP:  Andria Frames, PA-C  Cardiologist:  [name], MD Electrophysiologist:  None   Evaluation Performed:  Follow-Up Visit  Chief Complaint:  followup  History of Present Illness:    Brittney Sandoval is a 39 y.o. female  with a hx of HTN, DVT and PE. She delivered by C-section on 11/25/2015. She was found to have pulmonary emboli in October 2017. Venous Doppler at that time was also positive for DVT as well. She was treated with 6 month of Xarelto. Echocardiogram in December 2017 showed normal LV systolic function. Lower extremity Dopplers in January 2018 showed no DVT.  CTA of the chest in July 2018 showed no PE.  ETT in August 2018 was found to be abnormal was ST depression in the inferolateral leads with hypertensive response.  However stress echo in January 2019 was normal.  She was last seen by Dr. Stanford Breed in April 2019 at which time she was doing well.  Since our last visit, patient  was seen by Dr. Mercie Eon of [facility] in February 2020 for a atypical chest pain as well.  No further work-up was recommended.  She apparently had a bilateral lower extremity venous Doppler at [facility] on 12/19/2018, unfortunately I am unable to see the result.  Due to numbness on her left side, nerve conduction study was performed in late 2020 which was negative.  MRI of the brain obtained on 02/07/2019 at [facility] was normal.  Due to chronic fatigue, she was referred to rheumatology service in March 2021.  No rheumatological cause was identified to explain her left-sided numbness and the myalgia for years.  Cervical spine x-ray was obtained on 3/13 which showed minimal degenerative disc disease at C3-4.  Patient presents today for virtual cardiology office visit.  She continued to have intermittent chest pain which she described as a sharp spontaneous episode that lasted about 5 seconds or so before going away.  This occurs about once a week.  There is no obvious trigger for this.  I did not recommend any further ischemic work-up as her chest pain is clearly atypical at this time.  She does mention that during the episode of chest pain, she may feel some fluttering sensation in the chest.  Given the short durations, it could be PVCs.  We discussed the fact PVC is generally considered benign and can be monitored versus additional work-up.  She is quite concerned about her symptom and wish to get to the bottom of this.  We eventually agreed to proceed  with a 2-week heart monitor.  I will see her back in 5 weeks.  The patient does not have symptoms concerning for COVID-19 infection (fever, chills, cough, or new shortness of breath).    Past Medical History:  Diagnosis Date  . Anemia   . DVT (deep venous thrombosis) ([place])    a. 11/2015 U/S: Left Peroneal DVT;  b. 02/2016 U/S: No DVT bilat.  . Essential hypertension    a. 01/2016 Echo: EF 60-65%, no rwma, triv MR/TR, PASP 50mmHg.  . Fibroid    . Pulmonary emboli ([place])    a. 11/2015 CTA Chest: PE involving the segmental and subsegmental branches of the RML & RLL-->Xarelto.   Past Surgical History:  Procedure Laterality Date  . CESAREAN SECTION N/A 04/04/2013   Procedure: CESAREAN SECTION;  Surgeon: Emily Filbert, MD;  Location: [facility];  Service: Obstetrics;  Laterality: N/A;  . CESAREAN SECTION N/A 11/25/2015   Procedure: CESAREAN SECTION;  Surgeon: Everett Graff, MD;  Location: [facility];  Service: Obstetrics;  Laterality: N/A;  . LAPAROSCOPIC CHOLECYSTECTOMY  2010   [place], [place]  . TONSILLECTOMY       Current Meds  Medication Sig  . amLODipine (NORVASC) 10 MG tablet TAKE 1 TABLET DAILY. PLEASE MAKE ANNUAL APPT WITH DR. CRENSHAW FOR REFILLS. [phone].  Marland Kitchen Cholecalciferol 25 MCG (1000 UT) tablet Take by mouth.  . desonide (DESOWEN) 0.05 % lotion Apply 1 application topically 3 (three) times daily.  . ferrous sulfate 325 (65 FE) MG tablet Take 1 tablet by mouth 2 (two) times daily.  . Multiple Vitamins-Minerals (MULTIVITAL PO) Take by mouth.  . triamcinolone (KENALOG) 0.025 % ointment Apply to affected area twice daily as needed for eczema     Allergies:   Patient has no known allergies.   Social History   Tobacco Use  . Smoking status: Never Smoker  . Smokeless tobacco: Never Used  Substance Use Topics  . Alcohol use: Yes    Comment: occassionally  . Drug use: No     Family Hx: The patient's family history includes Diabetes in her mother; Heart disease in her mother; Hypertension in her mother; Kidney disease in her mother.  ROS:   Please see the history of present illness.     All other systems reviewed and are negative.   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    -------------------------------------------------------------------  History:  Risk factors: Pulmonary embolus. Anemia. Hypertension.  Obese.   -------------------------------------------------------------------  Study Conclusions   - Left ventricle: The cavity size was normal. Wall thickness was  normal. Systolic function was normal. The estimated ejection  fraction was in the range of 60% to 65%. Wall motion was normal;  there were no regional wall motion abnormalities. Left  ventricular diastolic function parameters were normal.  - Mitral valve: Mildly thickened leaflets . There was trivial  regurgitation.  - Left atrium: The atrium was normal in size.  - Tricuspid valve: There was trivial regurgitation.  - Pulmonary arteries: PA peak pressure: 13 mm Hg (S).  - Inferior vena cava: The vessel was normal in size. The  respirophasic diameter changes were in the normal range (>= 50%),  consistent with normal central venous pressure.   Impressions:   - LVEF 60-65%, normal wall thickness, motion and diastolic  function, trivial MR, normal LA size, trivial TR, RVSP 13 mmHg,  normal IVC.   Labs/Other Tests and Data Reviewed:    EKG:  An ECG dated 03/01/2018 was personally reviewed today and demonstrated:  Normal sinus rhythm without significant ST-T wave changes.  Recent Labs: No results found for requested labs within last 8760 hours.   Recent Lipid Panel No results found for: CHOL, TRIG, HDL, CHOLHDL, LDLCALC, LDLDIRECT  Wt Readings from Last 3 Encounters:  05/23/19 227 lb (103 kg)  03/01/18 216 lb (98 kg)  06/26/17 213 lb 6.4 oz (96.8 kg)     Objective:    Vital Signs:  Pulse 76   Temp (!) 97.3 F (36.3 C)   Ht 5\' 4"  (1.626 m)   Wt 227 lb (103 kg)   LMP 04/29/2019   BMI 38.96 kg/m    VITAL SIGNS:  reviewed  ASSESSMENT & PLAN:    1. Palpitation: Proceed with 2-week ZIO monitor.  2. Atypical chest pain: No further work-up.  The symptom is  chronic and only lasts seconds at a time.  Chest pain does not occur with physical activity.  3. History of PE: No recent recurrence  4. Hypertension: Continue on current therapy.  COVID-19 Education: The signs and symptoms of COVID-19 were discussed with the patient and how to seek care for testing (follow up with PCP or arrange E-visit).  The importance of social distancing was discussed today.  Time:   Today, I have spent 22 minutes with the patient with telehealth technology discussing the above problems.     Medication Adjustments/Labs and Tests Ordered: Current medicines are reviewed at length with the patient today.  Concerns regarding medicines are outlined above.   Tests Ordered: No orders of the defined types were placed in this encounter.   Medication Changes: No orders of the defined types were placed in this encounter.   Follow Up:  Either In Person or Virtual in 5 week(s)  Signed, Almyra Deforest, [place]  05/23/2019 12:37 PM    [place]

## 2019-05-23 NOTE — Patient Instructions (Addendum)
Medication Instructions:  Your physician recommends that you continue on your current medications as directed. Please refer to the Current Medication list given to you today.  *If you need a refill on your cardiac medications before your next appointment, please call your pharmacy*  Lab Work: NONE ordered at this time of appointment   If you have labs (blood work) drawn today and your tests are completely normal, you will receive your results only by: Marland Kitchen MyChart Message (if you have MyChart) OR . A paper copy in the mail If you have any lab test that is abnormal or we need to change your treatment, we will call you to review the results.  Testing/Procedures: [place] [place]- Long Term Monitor Instructions   Your physician has requested you wear your ZIO patch monitor 14 days.   This is a single patch monitor.  Ir[employer] supplies one patch monitor per enrollment.  Additional stickers are not available.   Please do not apply patch if you will be having a Nuclear Stress Test, Echocardiogram, Cardiac CT, MRI, or Chest Xray during the time frame you would be wearing the monitor. The patch cannot be worn during these tests.  You cannot remove and re-apply the ZIO XT patch monitor.   Your ZIO patch monitor will be sent USPS Priority mail from [facility] directly to your home address. The monitor may also be mailed to a PO BOX if home delivery is not available.   It may take 3-5 days to receive your monitor after you have been enrolled.   Once you have received you monitor, please review enclosed instructions.  Your monitor has already been registered assigning a specific monitor serial # to you.   Applying the monitor   Shave hair from upper left chest.   Hold abrader disc by orange tab.  Rub abrader in 40 strokes over left upper chest as indicated in your monitor instructions.   Clean area with 4 enclosed alcohol pads .  Use all pads to assure are is cleaned thoroughly.  Let dry.   Apply  patch as indicated in monitor instructions.  Patch will be place under collarbone on left side of chest with arrow pointing upward.   Rub patch adhesive wings for 2 minutes.Remove white label marked "1".  Remove white label marked "2".  Rub patch adhesive wings for 2 additional minutes.   While looking in a mirror, press and release button in center of patch.  A small green light will flash 3-4 times .  This will be your only indicator the monitor has been turned on.     Do not shower for the first 24 hours.  You may shower after the first 24 hours.   Press button if you feel a symptom. You will hear a small click.  Record Date, Time and Symptom in the Patient Log Book.   When you are ready to remove patch, follow instructions on last 2 pages of Patient Log Book.  Stick patch monitor onto last page of Patient Log Book.   Place Patient Log Book in [place] box.  Use locking tab on box and tape box closed securely.  The [employer] and [employer] has [employer] on it.  Please place in mailbox as soon as possible.  Your physician should have your test results approximately 7 days after the monitor has been mailed back to [facility].   Call [place] at [phone] if you have questions regarding your ZIO XT patch monitor.  Call them immediately if you see an orange light blinking on your monitor.   If your monitor falls off in less than 4 days contact our Monitor department at [phone].  If your monitor becomes loose or falls off after 4 days call [employer] at [phone] for suggestions on securing your monitor.     Follow-Up: At [facility], you and your health needs are our priority.  As part of our continuing mission to provide you with exceptional heart care, we have created designated Provider Care Teams.  These Care Teams include your primary Cardiologist (physician) and Advanced Practice Providers (APPs -  Physician Assistants and Nurse Practitioners) who all work  together to provide you with the care you need, when you need it.  Your next appointment:   5 week(s)  The format for your next appointment:   Virtual Visit   Provider:   Almyra Deforest, PA-C  Other Instructions

## 2019-05-23 NOTE — Telephone Encounter (Signed)
  Patient Consent for Virtual Visit         Brittney Sandoval has provided verbal consent on 05/23/2019 for a virtual visit (video or telephone).   CONSENT FOR VIRTUAL VISIT FOR:  Brittney Sandoval  By participating in this virtual visit I agree to the following:  I hereby voluntarily request, consent and authorize [place] and its employed or contracted physicians, physician assistants, nurse practitioners or other licensed health care professionals (the Practitioner), to provide me with telemedicine health care services (the "Services") as deemed necessary by the treating Practitioner. I acknowledge and consent to receive the Services by the Practitioner via telemedicine. I understand that the telemedicine visit will involve communicating with the Practitioner through live audiovisual communication technology and the disclosure of certain medical information by electronic transmission. I acknowledge that I have been given the opportunity to request an in-person assessment or other available alternative prior to the telemedicine visit and am voluntarily participating in the telemedicine visit.  I understand that I have the right to withhold or withdraw my consent to the use of telemedicine in the course of my care at any time, without affecting my right to future care or treatment, and that the Practitioner or I may terminate the telemedicine visit at any time. I understand that I have the right to inspect all information obtained and/or recorded in the course of the telemedicine visit and may receive copies of available information for a reasonable fee.  I understand that some of the potential risks of receiving the Services via telemedicine include:  Marland Kitchen Delay or interruption in medical evaluation due to technological equipment failure or disruption; . Information transmitted may not be sufficient (e.g. poor resolution of images) to allow for appropriate medical decision  making by the Practitioner; and/or  . In rare instances, security protocols could fail, causing a breach of personal health information.  Furthermore, I acknowledge that it is my responsibility to provide information about my medical history, conditions and care that is complete and accurate to the best of my ability. I acknowledge that Practitioner's advice, recommendations, and/or decision may be based on factors not within their control, such as incomplete or inaccurate data provided by me or distortions of diagnostic images or specimens that may result from electronic transmissions. I understand that the practice of medicine is not an exact science and that Practitioner makes no warranties or guarantees regarding treatment outcomes. I acknowledge that a copy of this consent can be made available to me via my patient portal ([facility]), or I can request a printed copy by calling the office of [facility].    I understand that my insurance will be billed for this visit.   I have read or had this consent read to me. . I understand the contents of this consent, which adequately explains the benefits and risks of the Services being provided via telemedicine.  . I have been provided ample opportunity to ask questions regarding this consent and the Services and have had my questions answered to my satisfaction. . I give my informed consent for the services to be provided through the use of telemedicine in my medical care

## 2019-05-31 ENCOUNTER — Other Ambulatory Visit: Payer: Self-pay

## 2019-05-31 DIAGNOSIS — R002 Palpitations: Secondary | ICD-10-CM

## 2019-05-31 DIAGNOSIS — R0789 Other chest pain: Secondary | ICD-10-CM

## 2019-05-31 NOTE — Progress Notes (Signed)
Placed new order for a 14 day event monitor. Called patient to inform her to mail the current Zio monitor back  and a new order for an event monitor will be placed for her sensitive skin. Patient verbalized an understanding and all questions (if any) were answered.

## 2019-05-31 NOTE — Telephone Encounter (Signed)
New Message  Patient is calling in about the monitor. Patient is states that she wore the monitor for 2 days, but was unable to continue due to a skin irritation from the adhesive. Patient states that she has a rash where the adhesive was from the monitor. Please give patient a call back to assist.

## 2019-05-31 NOTE — Telephone Encounter (Signed)
Discussed with Terrah regarding hypoallergenic patch with monitor

## 2019-05-31 NOTE — Telephone Encounter (Signed)
Called patient back- had a virtual visit with Almyra Deforest, PA and he wanted to do a 14 day ZIO, patient tried to wear it, and once she placed it on it began to itch- but then once 2 days passed and it was not any better she took it off and it was red and bumpy, she does not know what else to do.  I advised patient to hold onto it for now- I would reach out to PA and get his opinion on what we needed to do. Patient verbalized understanding, thankful for call.

## 2019-06-03 ENCOUNTER — Telehealth: Payer: Self-pay | Admitting: Radiology

## 2019-06-03 NOTE — Telephone Encounter (Signed)
Enrolled patient for a 14 day [employer] Event monitor to be mailed to patients home. Sensitive skin electrodes requested.  Patient unable to wear Zio monitor due to allergic reaction. Zio rep was contacted.

## 2019-06-10 ENCOUNTER — Telehealth: Payer: Medicaid Other | Admitting: Physician Assistant

## 2019-06-10 ENCOUNTER — Ambulatory Visit: Payer: Medicaid Other

## 2019-06-18 ENCOUNTER — Ambulatory Visit: Payer: Medicaid Other

## 2019-06-25 ENCOUNTER — Ambulatory Visit: Payer: Medicaid Other

## 2019-06-25 ENCOUNTER — Encounter (INDEPENDENT_AMBULATORY_CARE_PROVIDER_SITE_OTHER): Payer: Medicaid Other

## 2019-06-25 DIAGNOSIS — R002 Palpitations: Secondary | ICD-10-CM | POA: Diagnosis not present

## 2019-07-08 ENCOUNTER — Telehealth: Payer: Self-pay | Admitting: *Deleted

## 2019-07-08 NOTE — Telephone Encounter (Signed)
ATTEMPTED TO REACH PT VIA PHONE TO CHANGE APPT ON Wednesday, AS HAO MENG PA IS WORKING AT HOSPITAL ,UNABLE TO LEAVE MESSAGE MAILBOX FULL . WILL TRY LATER .Adonis Housekeeper

## 2019-07-09 ENCOUNTER — Ambulatory Visit
Admission: RE | Admit: 2019-07-09 | Discharge: 2019-07-09 | Disposition: A | Discharge status: Home / Self Care | Payer: Medicaid Other | Source: Ambulatory Visit | Attending: Physician Assistant | Admitting: Physician Assistant

## 2019-07-09 ENCOUNTER — Other Ambulatory Visit: Payer: Self-pay

## 2019-07-09 DIAGNOSIS — Z1231 Encounter for screening mammogram for malignant neoplasm of breast: Secondary | ICD-10-CM

## 2019-07-10 ENCOUNTER — Ambulatory Visit: Payer: Medicaid Other | Admitting: Physician Assistant

## 2019-07-17 ENCOUNTER — Encounter: Payer: Self-pay | Admitting: Physician Assistant

## 2019-07-17 ENCOUNTER — Telehealth: Payer: Self-pay

## 2019-07-17 ENCOUNTER — Telehealth (INDEPENDENT_AMBULATORY_CARE_PROVIDER_SITE_OTHER): Payer: Medicaid Other | Admitting: Physician Assistant

## 2019-07-17 DIAGNOSIS — R002 Palpitations: Secondary | ICD-10-CM | POA: Diagnosis not present

## 2019-07-17 DIAGNOSIS — I8290 Acute embolism and thrombosis of unspecified vein: Secondary | ICD-10-CM

## 2019-07-17 DIAGNOSIS — R0789 Other chest pain: Secondary | ICD-10-CM

## 2019-07-17 DIAGNOSIS — I10 Essential (primary) hypertension: Secondary | ICD-10-CM

## 2019-07-17 NOTE — Telephone Encounter (Signed)
Left a voice message for the patient to give the office a call back to get schedule for her 3 month follow up and left the details of her virtual AVS.

## 2019-07-17 NOTE — Patient Instructions (Addendum)
Medication Instructions:  Your physician recommends that you continue on your current medications as directed. Please refer to the Current Medication list given to you today.  *If you need a refill on your cardiac medications before your next appointment, please call your pharmacy*  Lab Work: NONE ordered at this time of appointment   If you have labs (blood work) drawn today and your tests are completely normal, you will receive your results only by: Marland Kitchen MyChart Message (if you have MyChart) OR . A paper copy in the mail If you have any lab test that is abnormal or we need to change your treatment, we will call you to review the results.  Testing/Procedures: NONE ordered at this time of appointment   Follow-Up: At [facility], you and your health needs are our priority.  As part of our continuing mission to provide you with exceptional heart care, we have created designated Provider Care Teams.  These Care Teams include your primary Cardiologist (physician) and Advanced Practice Providers (APPs -  Physician Assistants and Nurse Practitioners) who all work together to provide you with the care you need, when you need it.  Your next appointment:   3 month(s)  The format for your next appointment:   In Person  Provider:   [name], MD  Other Instructions   Continue to monitor chest pain, if the pain lasts longer than a few seconds or become associated with physical activity give our office a call.

## 2019-07-17 NOTE — Progress Notes (Signed)
Virtual Visit via Telephone Note   This visit type was conducted due to national recommendations for restrictions regarding the COVID-19 Pandemic (e.g. social distancing) in an effort to limit this patient's exposure and mitigate transmission in our community.  Due to her co-morbid illnesses, this patient is at least at moderate risk for complications without adequate follow up.  This format is felt to be most appropriate for this patient at this time.  The patient did not have access to video technology/had technical difficulties with video requiring transitioning to audio format only (telephone).  All issues noted in this document were discussed and addressed.  No physical exam could be performed with this format.  Please refer to the patient's chart for her  consent to telehealth for [facility].   The patient was identified using 2 identifiers.  Date:  07/19/2019   ID:  Brittney Sandoval, DOB Sep 10, 1980, MRN [id number]  Patient Location: Home Provider Location: Home  PCP:  Brittney Frames, PA-C  Cardiologist:  [name], MD  Electrophysiologist:  None   Evaluation Performed:  Follow-Up Visit  Chief Complaint:  followup  History of Present Illness:    Brittney Sandoval is a 39 y.o. female with a hx of HTN, DVT and PE. She delivered by C-section on 11/25/2015. She was found to have pulmonary emboli in October 2017. Venous Doppler at that time was also positive for DVT as well. She was treated with 6 month of Xarelto. Echocardiogram in December 2017 showed normal LV systolic function. Lower extremity Dopplers in January 2018 showed no DVT.  CTA of the chest in July 2018 showed no PE.  ETT in August 2018 was found to be abnormal was ST depression in the inferolateral leads with hypertensive response.  However stress echo in January 2019 was normal.  She was last seen by Dr. Stanford Breed in April 2019 at which time she was doing well.  Since our last visit, patient  was seen by Dr. Mercie Eon of [facility] in February 2020 for a atypical chest pain as well.  No further work-up was recommended.  She apparently had a bilateral lower extremity venous Doppler at [facility] on 12/19/2018, unfortunately I am unable to see the result.  Due to numbness on her left side, nerve conduction study was performed in late 2020 which was negative.  MRI of the brain obtained on 02/07/2019 at [facility] was normal.  Due to chronic fatigue, she was referred to rheumatology service in March 2021.  No rheumatological cause was identified to explain her left-sided numbness and the myalgia for years.  Cervical spine x-ray was obtained on 3/13 which showed minimal degenerative disc disease at C3-4.  I last saw the patient via virtual visit on 05/23/2019 at which time she described some atypical chest pain and occasional fluttering sensation in the chest.  I suspect it could be related to PVCs however she was quite concerned about the symptom.  We eventually proceeded with a 2-week heart monitor.  Initial Zio patch caused significant skin irritation and burns, this was later switched to a 2 week [employer] monitor.  I have personally reviewed heart monitor report, despite multiple trip patient triggered event, there was no sign of irregular heart rhythm such as PVCs or atrial fibrillation.  Her symptom is quite transient and only lasted a few seconds each time.  There was a single episode of nonconducted P wave.  However this likely occurred while she was still asleep and it did  not recur again.  I did not recommend any treatment for this since she is completely asymptomatic.  I would not recommend any AV nodal blocking agent in this patient with history of second-degree AV block.  She continued to have episodes of atypical chest pain that does not correlate with the degree of exertion.  This chest pain only last a few seconds at a time before going away.  At this time, I do not recommend any  further work-up.  She can follow-up with Dr. Stanford Breed in 3 months.  The patient does not have symptoms concerning for COVID-19 infection (fever, chills, cough, or new shortness of breath).    Past Medical History:  Diagnosis Date  . Anemia   . DVT (deep venous thrombosis) ([place])    a. 11/2015 U/S: Left Peroneal DVT;  b. 02/2016 U/S: No DVT bilat.  . Essential hypertension    a. 01/2016 Echo: EF 60-65%, no rwma, triv MR/TR, PASP 69mmHg.  . Fibroid   . Pulmonary emboli ([place])    a. 11/2015 CTA Chest: PE involving the segmental and subsegmental branches of the RML & RLL-->Xarelto.   Past Surgical History:  Procedure Laterality Date  . CESAREAN SECTION N/A 04/04/2013   Procedure: CESAREAN SECTION;  Surgeon: Emily Filbert, MD;  Location: [facility];  Service: Obstetrics;  Laterality: N/A;  . CESAREAN SECTION N/A 11/25/2015   Procedure: CESAREAN SECTION;  Surgeon: Everett Graff, MD;  Location: [place];  Service: Obstetrics;  Laterality: N/A;  . LAPAROSCOPIC CHOLECYSTECTOMY  2010   Aldrich, [place]  . TONSILLECTOMY       Current Meds  Medication Sig  . amLODipine (NORVASC) 10 MG tablet TAKE 1 TABLET DAILY. PLEASE MAKE ANNUAL APPT WITH DR. CRENSHAW FOR REFILLS. [phone].  Marland Kitchen Cholecalciferol 25 MCG (1000 UT) tablet Take by mouth.  . desonide (DESOWEN) 0.05 % lotion Apply 1 application topically 3 (three) times daily. AS Needed for Eczema  . ferrous sulfate 325 (65 FE) MG tablet Take 1 tablet by mouth 2 (two) times daily.  . Multiple Vitamins-Minerals (MULTIVITAL PO) Take by mouth.     Allergies:   Patient has no known allergies.   Social History   Tobacco Use  . Smoking status: Never Smoker  . Smokeless tobacco: Never Used  Substance Use Topics  . Alcohol use: Yes    Comment: occassionally  . Drug use: No     Family Hx: The patient's family history includes Diabetes in her mother; Heart disease in her mother; Hypertension in her mother; Kidney disease in her  mother.  ROS:   Please see the history of present illness.     All other systems reviewed and are negative.   Prior CV studies:   The following studies were reviewed today:  Echo 03/07/2017 Study Conclusions   - Stress ECG conclusions: There were no stress arrhythmias or  conduction abnormalities. The stress ECG was non-diagnostic due  to resting ST/T wave abnormality. [facility] scoring: exercise time of  8.5 min; maximum ST deviation of 0.5 mm; no angina; resulting  score is 6. This score predicts a low risk of cardiac events.  - Staged echo: There was no echocardiographic evidence for  stress-induced ischemia.   Impressions:   - Normal study after maximal exercise.   Labs/Other Tests and Data Reviewed:    EKG:  An ECG dated 03/01/2018 was personally reviewed today and demonstrated:  NSR without significant ST-T wave changes  Recent Labs: No results found for requested labs within  last 8760 hours.   Recent Lipid Panel No results found for: CHOL, TRIG, HDL, CHOLHDL, LDLCALC, LDLDIRECT  Wt Readings from Last 3 Encounters:  05/23/19 227 lb (103 kg)  03/01/18 216 lb (98 kg)  06/26/17 213 lb 6.4 oz (96.8 kg)     Objective:    Vital Signs:  LMP 06/29/2019    VITAL SIGNS:  reviewed  ASSESSMENT & PLAN:    1. Palpitation: Recent heart monitor report has been reviewed with the patient.  Although there is a single episode of nonconducted P wave, this occurred while patient was asleep and therefore she was asymptomatic.  There was no significant PVCs or atrial fibrillation.  2. Atypical chest pain: Patient continued to have intermittent chest pain however at this time I do not recommend any further work-up  3. Hypertension: Continue on current therapy  4. History of DVT/PE: No recurrence  COVID-19 Education: The signs and symptoms of COVID-19 were discussed with the patient and how to seek care for testing (follow up with PCP or arrange E-visit).  The importance of  social distancing was discussed today.  Time:   Today, I have spent 10 minutes with the patient with telehealth technology discussing the above problems.     Medication Adjustments/Labs and Tests Ordered: Current medicines are reviewed at length with the patient today.  Concerns regarding medicines are outlined above.   Tests Ordered: No orders of the defined types were placed in this encounter.   Medication Changes: No orders of the defined types were placed in this encounter.   Follow Up:  Either In Person or Virtual in 3 month(s)  Signed, Almyra Deforest, [place]  07/19/2019 12:08 AM    [place]

## 2019-08-06 ENCOUNTER — Ambulatory Visit: Payer: Medicaid Other | Admitting: Physician Assistant

## 2019-08-19 DIAGNOSIS — Z23 Encounter for immunization: Secondary | ICD-10-CM | POA: Diagnosis not present

## 2019-08-26 ENCOUNTER — Other Ambulatory Visit: Payer: Self-pay

## 2019-08-26 MED ORDER — AMLODIPINE BESYLATE 10 MG PO TABS: ORAL_TABLET | ORAL | 0 refills | Status: DC

## 2019-09-04 ENCOUNTER — Other Ambulatory Visit: Payer: Self-pay

## 2019-09-04 MED ORDER — AMLODIPINE BESYLATE 10 MG PO TABS: 10.0000 mg | ORAL_TABLET | Freq: Every day | ORAL | 3 refills | Status: DC

## 2019-09-04 NOTE — Telephone Encounter (Signed)
Rx(s) sent to pharmacy electronically.  

## 2019-09-09 DIAGNOSIS — Z23 Encounter for immunization: Secondary | ICD-10-CM | POA: Diagnosis not present

## 2019-09-30 NOTE — Progress Notes (Deleted)
HPI: Follow-up hypertension and pulmonary embolus. Patient delivered by C-section on 11/25/2015. She was found to have a pulmonary embolus in October 2017 and there was note of DVT on lower extremity venous Dopplers. Shewas treated withxarelto. Echocardiogram December 2017 showed normal LV systolic function.Chest CTA 7/18 showed no pulmonary embolus. ETT August 2018 felt to be abnormal with ST depression in the inferior lateral leads with hypotensive response.  However stress echocardiogram January 2019 normal. Monitor May 2021 showed sinus rhythm with transient Mobitz 1 second-degree AV block in the early morning hours.  Since last seen,   Current Outpatient Medications  Medication Sig Dispense Refill  . amLODipine (NORVASC) 10 MG tablet Take 1 tablet (10 mg total) by mouth daily. 90 tablet 3  . Cholecalciferol 25 MCG (1000 UT) tablet Take by mouth.    . desonide (DESOWEN) 0.05 % lotion Apply 1 application topically 3 (three) times daily. AS Needed for Eczema    . ferrous sulfate 325 (65 FE) MG tablet Take 1 tablet by mouth 2 (two) times daily.  1  . Multiple Vitamins-Minerals (MULTIVITAL PO) Take by mouth.     No current facility-administered medications for this visit.     Past Medical History:  Diagnosis Date  . Anemia   . DVT (deep venous thrombosis) ([place])    a. 11/2015 U/S: Left Peroneal DVT;  b. 02/2016 U/S: No DVT bilat.  . Essential hypertension    a. 01/2016 Echo: EF 60-65%, no rwma, triv MR/TR, PASP 32mmHg.  . Fibroid   . Pulmonary emboli ([place])    a. 11/2015 CTA Chest: PE involving the segmental and subsegmental branches of the RML & RLL-->Xarelto.    Past Surgical History:  Procedure Laterality Date  . CESAREAN SECTION N/A 04/04/2013   Procedure: CESAREAN SECTION;  Surgeon: Emily Filbert, MD;  Location: [facility];  Service: Obstetrics;  Laterality: N/A;  . CESAREAN SECTION N/A 11/25/2015   Procedure: CESAREAN SECTION;  Surgeon: Everett Graff, MD;  Location: [facility];  Service: Obstetrics;  Laterality: N/A;  . LAPAROSCOPIC CHOLECYSTECTOMY  2010   [place], [place]  . TONSILLECTOMY      Social History   Socioeconomic History  . Marital status: Single    Spouse name: Not on file  . Number of children: Not on file  . Years of education: Not on file  . Highest education level: Not on file  Occupational History  . Not on file  Tobacco Use  . Smoking status: Never Smoker  . Smokeless tobacco: Never Used  Vaping Use  . Vaping Use: Never used  Substance and Sexual Activity  . Alcohol use: Yes    Comment: occassionally  . Drug use: No  . Sexual activity: Yes    Birth control/protection: None  Other Topics Concern  . Not on file  Social History Narrative   Lives in [place].  Relocated to [place] a few years ago.   Social Determinants of Health   Financial Resource Strain:   . Difficulty of Paying Living Expenses:   Food Insecurity:   . Worried About Charity fundraiser in the Last Year:   . Arboriculturist in the Last Year:   Transportation Needs:   . Film/video editor (Medical):   Marland Kitchen Lack of Transportation (Non-Medical):   Physical Activity:   . Days of Exercise per Week:   . Minutes of Exercise per Session:   Stress:   . Feeling of Stress :  Social Connections:   . Frequency of Communication with Friends and Family:   . Frequency of Social Gatherings with Friends and Family:   . Attends Religious Services:   . Active Member of Clubs or Organizations:   . Attends Archivist Meetings:   Marland Kitchen Marital Status:   Intimate Partner Violence:   . Fear of Current or Ex-Partner:   . Emotionally Abused:   Marland Kitchen Physically Abused:   . Sexually Abused:     Family History  Problem Relation Age of Onset  . Diabetes Mother   . Hypertension Mother   . Kidney disease Mother   . Heart disease Mother     ROS: no fevers or chills, productive cough, hemoptysis, dysphasia, odynophagia, melena, hematochezia,  dysuria, hematuria, rash, seizure activity, orthopnea, PND, pedal edema, claudication. Remaining systems are negative.  Physical Exam: Well-developed well-nourished in no acute distress.  Skin is warm and dry.  HEENT is normal.  Neck is supple.  Chest is clear to auscultation with normal expansion.  Cardiovascular exam is regular rate and rhythm.  Abdominal exam nontender or distended. No masses palpated. Extremities show no edema. neuro grossly intact  ECG- personally reviewed  A/P  1 palpitations-recent monitor showed no significant arrhythmia.  We will continue to follow.  2 hypertension-patient's blood pressure is controlled.  Continue present medications and follow.  3 history of chest pain-symptoms atypical.  Previous stress echocardiogram normal.  We will not pursue further cardiac evaluation at this point.  [name], MD

## 2019-10-07 ENCOUNTER — Ambulatory Visit (INDEPENDENT_AMBULATORY_CARE_PROVIDER_SITE_OTHER): Payer: Medicaid Other | Admitting: Cardiology

## 2019-10-07 ENCOUNTER — Encounter: Payer: Self-pay | Admitting: Cardiology

## 2019-10-07 ENCOUNTER — Ambulatory Visit: Payer: Medicaid Other | Admitting: Cardiology

## 2019-10-07 ENCOUNTER — Other Ambulatory Visit: Payer: Self-pay

## 2019-10-07 VITALS — BP 142/82 | HR 70 | Temp 96.9°F | Ht 64.5 in | Wt 233.0 lb

## 2019-10-07 DIAGNOSIS — R002 Palpitations: Secondary | ICD-10-CM

## 2019-10-07 DIAGNOSIS — I10 Essential (primary) hypertension: Secondary | ICD-10-CM | POA: Diagnosis not present

## 2019-10-07 DIAGNOSIS — R0683 Snoring: Secondary | ICD-10-CM | POA: Diagnosis not present

## 2019-10-07 DIAGNOSIS — R072 Precordial pain: Secondary | ICD-10-CM

## 2019-10-07 MED ORDER — METOPROLOL TARTRATE 100 MG PO TABS: ORAL_TABLET | ORAL | 0 refills | Status: DC

## 2019-10-07 NOTE — Progress Notes (Signed)
HPI: Follow-up hypertension and palpitations. Patient delivered by C-section on 11/25/2015. She was found to have a pulmonary embolus in October 2017 and there was note of DVT on lower extremity venous Dopplers. Shewas treated withxarelto. Echocardiogram December 2017 showed normal LV systolic function.Chest CTA 7/18 showed no pulmonary embolus. ETT August 2018 felt to be abnormal with ST depression in the inferior lateral leads with hypotensive response.  However stress echocardiogram January 2019 normal.    Monitor May 2021 showed transient Mobitz 1 second-degree AV block in the early morning hours.  Since last seen, she describes continued chest pain.  It is in the left lateral breast area and substernal area.  It is described as an ache.  It is not exertional, pleuritic or positional.  Resolved spontaneously.  Last minutes at a time.  No associated symptoms.  Her palpitations have improved.  She has some dyspnea on exertion.  Current Outpatient Medications  Medication Sig Dispense Refill  . amLODipine (NORVASC) 10 MG tablet Take 1 tablet (10 mg total) by mouth daily. 90 tablet 3  . Cholecalciferol 25 MCG (1000 UT) tablet Take by mouth.    . ferrous sulfate 325 (65 FE) MG tablet Take 1 tablet by mouth 2 (two) times daily.  1  . Multiple Vitamins-Minerals (MULTIVITAL PO) Take by mouth.    . NORLYDA 0.35 MG tablet Take by mouth.    . triamcinolone ointment (KENALOG) 0.1 % Apply topically.     No current facility-administered medications for this visit.     Past Medical History:  Diagnosis Date  . Anemia   . DVT (deep venous thrombosis) ([place])    a. 11/2015 U/S: Left Peroneal DVT;  b. 02/2016 U/S: No DVT bilat.  . Essential hypertension    a. 01/2016 Echo: EF 60-65%, no rwma, triv MR/TR, PASP 82mmHg.  . Fibroid   . Pulmonary emboli ([place])    a. 11/2015 CTA Chest: PE involving the segmental and subsegmental branches of the RML & RLL-->Xarelto.    Past Surgical History:  Procedure  Laterality Date  . CESAREAN SECTION N/A 04/04/2013   Procedure: CESAREAN SECTION;  Surgeon: Emily Filbert, MD;  Location: [facility];  Service: Obstetrics;  Laterality: N/A;  . CESAREAN SECTION N/A 11/25/2015   Procedure: CESAREAN SECTION;  Surgeon: Everett Graff, MD;  Location: [facility];  Service: Obstetrics;  Laterality: N/A;  . LAPAROSCOPIC CHOLECYSTECTOMY  2010   [place], [place]  . TONSILLECTOMY      Social History   Socioeconomic History  . Marital status: Single    Spouse name: Not on file  . Number of children: Not on file  . Years of education: Not on file  . Highest education level: Not on file  Occupational History  . Not on file  Tobacco Use  . Smoking status: Never Smoker  . Smokeless tobacco: Never Used  Vaping Use  . Vaping Use: Never used  Substance and Sexual Activity  . Alcohol use: Yes    Comment: occassionally  . Drug use: No  . Sexual activity: Yes    Birth control/protection: None  Other Topics Concern  . Not on file  Social History Narrative   Lives in [place].  Relocated to [place] a few years ago.   Social Determinants of Health   Financial Resource Strain:   . Difficulty of Paying Living Expenses:   Food Insecurity:   . Worried About Charity fundraiser in the Last Year:   . Ran  Out of Food in the Last Year:   Transportation Needs:   . Lack of Transportation (Medical):   Marland Kitchen Lack of Transportation (Non-Medical):   Physical Activity:   . Days of Exercise per Week:   . Minutes of Exercise per Session:   Stress:   . Feeling of Stress :   Social Connections:   . Frequency of Communication with Friends and Family:   . Frequency of Social Gatherings with Friends and Family:   . Attends Religious Services:   . Active Member of Clubs or Organizations:   . Attends Archivist Meetings:   Marland Kitchen Marital Status:   Intimate Partner Violence:   . Fear of Current or Ex-Partner:   . Emotionally Abused:   Marland Kitchen Physically  Abused:   . Sexually Abused:     Family History  Problem Relation Age of Onset  . Diabetes Mother   . Hypertension Mother   . Kidney disease Mother   . Heart disease Mother     ROS: no fevers or chills, productive cough, hemoptysis, dysphasia, odynophagia, melena, hematochezia, dysuria, hematuria, rash, seizure activity, orthopnea, PND, pedal edema, claudication. Remaining systems are negative.  Physical Exam: Well-developed well-nourished in no acute distress.  Skin is warm and dry.  HEENT is normal.  Neck is supple.  Chest is clear to auscultation with normal expansion.  Cardiovascular exam is regular rate and rhythm.  Abdominal exam nontender or distended. No masses palpated. Extremities show no edema. neuro grossly intact  ECG-sinus rhythm at a rate of 70, first-degree AV block.  Personally reviewed  A/P  1 palpitations-recent monitor showed no significant arrhythmias.  We will follow for now.  No beta-blocker given Mobitz 1 noted in the early a.m. hours on recent monitor.  2 hypertension-patient's blood pressure is mildly elevated but she has not taken her medications this morning.  Continue amlodipine at present dose and follow.  3 history of chest pain-patient continues to describe chest pain.  She is very concerned about her symptoms.  We will arrange a cardiac CTA to exclude obstructive coronary disease.  4 snoring-we will arrange evaluation with pulmonary to exclude sleep apnea.  [name], MD

## 2019-10-07 NOTE — Patient Instructions (Signed)
Medication Instructions:  NO CHANGE *If you need a refill on your cardiac medications before your next appointment, please call your pharmacy*   Lab Work: If you have labs (blood work) drawn today and your tests are completely normal, you will receive your results only by: Marland Kitchen MyChart Message (if you have MyChart) OR . A paper copy in the mail If you have any lab test that is abnormal or we need to change your treatment, we will call you to review the results.   Testing/Procedures:  Your cardiac CT will be scheduled at one of the below locations:   [facility] [address] [place], [place] [address] [phone]  If scheduled at [facility], please arrive at the [facility] main entrance of [facility] 30 minutes prior to test start time. Proceed to the [facility] Radiology Department (first floor) to check-in and test prep.  Please follow these instructions carefully (unless otherwise directed):   On the Night Before the Test: . Be sure to Drink plenty of water. . Do not consume any caffeinated/decaffeinated beverages or chocolate 12 hours prior to your test. . Do not take any antihistamines 12 hours prior to your test.  On the Day of the Test: . Drink plenty of water. Do not drink any water within one hour of the test. . Do not eat any food 4 hours prior to the test. . You may take your regular medications prior to the test.  . Take metoprolol (Lopressor) 100 MG two hours prior to test. . HOLD Furosemide/Hydrochlorothiazide morning of the test. . FEMALES- please wear underwire-free bra if available  After the Test: . Drink plenty of water. . After receiving IV contrast, you may experience a mild flushed feeling. This is normal. . On occasion, you may experience a mild rash up to 24 hours after the test. This is not dangerous. If this occurs, you can take Benadryl 25 mg and increase your fluid intake. . If you experience trouble breathing,  this can be serious. If it is severe call 911 IMMEDIATELY. If it is mild, please call our office. . If you take any of these medications: Glipizide/Metformin, Avandament, Glucavance, please do not take 48 hours after completing test unless otherwise instructed.   Once we have confirmed authorization from your insurance company, we will call you to set up a date and time for your test. Based on how quickly your insurance processes prior authorizations requests, please allow up to 4 weeks to be contacted for scheduling your Cardiac CT appointment. Be advised that routine Cardiac CT appointments could be scheduled as many as 8 weeks after your provider has ordered it.  For non-scheduling related questions, please contact the cardiac imaging nurse navigator should you have any questions/concerns: Marchia Bond, Cardiac Imaging Nurse Navigator Burley Saver, Interim Cardiac Imaging Nurse [facility] Direct Office Dial: [phone]   For scheduling needs, including cancellations and rescheduling, please call Vivien Rota at [phone], option [phone].      Follow-Up: At [facility], you and your health needs are our priority.  As part of our continuing mission to provide you with exceptional heart care, we have created designated Provider Care Teams.  These Care Teams include your primary Cardiologist (physician) and Advanced Practice Providers (APPs -  Physician Assistants and Nurse Practitioners) who all work together to provide you with the care you need, when you need it.  We recommend signing up for the patient portal called "  MyChart".  Sign up information is provided on this After Visit Summary.  MyChart is used to connect with patients for Virtual Visits (Telemedicine).  Patients are able to view lab/test results, encounter notes, upcoming appointments, etc.  Non-urgent messages can be sent to your provider as well.   To learn more about what you can do with MyChart, go  to [email or web address].    Your next appointment:   12 month(s)  The format for your next appointment:   In Person  Provider:   You may see [name], MD or one of the following Advanced Practice Providers on your designated Care Team:    Kerin Ransom, PA-C  [place], [place]  Coletta Memos, [place]

## 2019-10-21 DIAGNOSIS — R200 Anesthesia of skin: Secondary | ICD-10-CM | POA: Diagnosis not present

## 2019-10-21 DIAGNOSIS — R202 Paresthesia of skin: Secondary | ICD-10-CM | POA: Diagnosis not present

## 2019-11-19 ENCOUNTER — Institutional Professional Consult (permissible substitution): Payer: Medicaid Other | Admitting: Pulmonary Disease

## 2019-11-20 ENCOUNTER — Telehealth (HOSPITAL_COMMUNITY): Payer: Self-pay | Admitting: Emergency Medicine

## 2019-11-20 NOTE — Telephone Encounter (Signed)
Reaching out to patient to offer assistance regarding upcoming cardiac imaging study; pt verbalizes understanding of appt date/time, parking situation and where to check in, pre-test NPO status and medications ordered, and verified current allergies; name and call back number provided for further questions should they arise Christianjames Soule RN Navigator Cardiac Imaging [facility] [phone] office [phone] cell 

## 2019-11-22 ENCOUNTER — Ambulatory Visit (HOSPITAL_COMMUNITY)
Admission: RE | Admit: 2019-11-22 | Discharge: 2019-11-22 | Disposition: A | Discharge status: Home / Self Care | Payer: Medicaid Other | Source: Ambulatory Visit | Attending: Cardiology | Admitting: Cardiology

## 2019-11-22 ENCOUNTER — Other Ambulatory Visit: Payer: Self-pay

## 2019-11-22 DIAGNOSIS — R072 Precordial pain: Secondary | ICD-10-CM | POA: Insufficient documentation

## 2019-11-22 MED ORDER — NITROGLYCERIN 0.4 MG SL SUBL
SUBLINGUAL_TABLET | SUBLINGUAL | Status: AC
  Filled 2019-11-22: qty 2

## 2019-11-22 MED ORDER — NITROGLYCERIN 0.4 MG SL SUBL
0.8000 mg | SUBLINGUAL_TABLET | Freq: Once | SUBLINGUAL | Status: AC
  Administered 2019-11-22: 0.8 mg via SUBLINGUAL

## 2019-11-22 MED ORDER — IOHEXOL 350 MG/ML SOLN
80.0000 mL | Freq: Once | INTRAVENOUS | Status: AC | PRN
  Administered 2019-11-22: 80 mL via INTRAVENOUS

## 2019-12-03 DIAGNOSIS — G8929 Other chronic pain: Secondary | ICD-10-CM | POA: Diagnosis not present

## 2019-12-03 DIAGNOSIS — M5030 Other cervical disc degeneration, unspecified cervical region: Secondary | ICD-10-CM | POA: Diagnosis not present

## 2019-12-03 DIAGNOSIS — M542 Cervicalgia: Secondary | ICD-10-CM | POA: Diagnosis not present

## 2019-12-09 ENCOUNTER — Institutional Professional Consult (permissible substitution): Payer: Medicaid Other | Admitting: Pulmonary Disease

## 2019-12-17 DIAGNOSIS — M5030 Other cervical disc degeneration, unspecified cervical region: Secondary | ICD-10-CM | POA: Diagnosis not present

## 2019-12-17 DIAGNOSIS — M542 Cervicalgia: Secondary | ICD-10-CM | POA: Diagnosis not present

## 2019-12-17 DIAGNOSIS — G8929 Other chronic pain: Secondary | ICD-10-CM | POA: Diagnosis not present

## 2019-12-19 ENCOUNTER — Ambulatory Visit: Payer: Medicaid Other | Admitting: Cardiology

## 2019-12-25 DIAGNOSIS — G8929 Other chronic pain: Secondary | ICD-10-CM | POA: Diagnosis not present

## 2019-12-25 DIAGNOSIS — M542 Cervicalgia: Secondary | ICD-10-CM | POA: Diagnosis not present

## 2020-01-14 DIAGNOSIS — G8929 Other chronic pain: Secondary | ICD-10-CM | POA: Diagnosis not present

## 2020-01-14 DIAGNOSIS — M5030 Other cervical disc degeneration, unspecified cervical region: Secondary | ICD-10-CM | POA: Diagnosis not present

## 2020-01-28 DIAGNOSIS — G8929 Other chronic pain: Secondary | ICD-10-CM | POA: Diagnosis not present

## 2020-01-28 DIAGNOSIS — M542 Cervicalgia: Secondary | ICD-10-CM | POA: Diagnosis not present

## 2020-03-26 DIAGNOSIS — Z8616 Personal history of COVID-19: Secondary | ICD-10-CM | POA: Diagnosis not present

## 2020-03-26 DIAGNOSIS — Z0000 Encounter for general adult medical examination without abnormal findings: Secondary | ICD-10-CM | POA: Diagnosis not present

## 2020-03-26 DIAGNOSIS — I10 Essential (primary) hypertension: Secondary | ICD-10-CM | POA: Diagnosis not present

## 2020-03-26 DIAGNOSIS — R202 Paresthesia of skin: Secondary | ICD-10-CM | POA: Diagnosis not present

## 2020-03-26 DIAGNOSIS — M5030 Other cervical disc degeneration, unspecified cervical region: Secondary | ICD-10-CM | POA: Diagnosis not present

## 2020-03-26 DIAGNOSIS — Z0001 Encounter for general adult medical examination with abnormal findings: Secondary | ICD-10-CM | POA: Diagnosis not present

## 2020-03-26 DIAGNOSIS — R52 Pain, unspecified: Secondary | ICD-10-CM | POA: Diagnosis not present

## 2020-03-26 DIAGNOSIS — Z1329 Encounter for screening for other suspected endocrine disorder: Secondary | ICD-10-CM | POA: Diagnosis not present

## 2020-03-26 DIAGNOSIS — Z8249 Family history of ischemic heart disease and other diseases of the circulatory system: Secondary | ICD-10-CM | POA: Diagnosis not present

## 2020-03-27 ENCOUNTER — Other Ambulatory Visit: Payer: Self-pay | Admitting: Cardiology

## 2020-04-08 ENCOUNTER — Emergency Department (HOSPITAL_COMMUNITY): Payer: Medicaid Other

## 2020-04-08 ENCOUNTER — Emergency Department (HOSPITAL_COMMUNITY)
Admission: EM | Admit: 2020-04-08 | Discharge: 2020-04-08 | Disposition: A | Discharge status: Home / Self Care | Payer: Medicaid Other | Attending: Emergency Medicine | Admitting: Emergency Medicine

## 2020-04-08 ENCOUNTER — Other Ambulatory Visit: Payer: Self-pay

## 2020-04-08 ENCOUNTER — Encounter (HOSPITAL_COMMUNITY): Payer: Self-pay

## 2020-04-08 DIAGNOSIS — Z79899 Other long term (current) drug therapy: Secondary | ICD-10-CM | POA: Diagnosis not present

## 2020-04-08 DIAGNOSIS — I2699 Other pulmonary embolism without acute cor pulmonale: Secondary | ICD-10-CM | POA: Diagnosis not present

## 2020-04-08 DIAGNOSIS — R079 Chest pain, unspecified: Secondary | ICD-10-CM | POA: Insufficient documentation

## 2020-04-08 DIAGNOSIS — R42 Dizziness and giddiness: Secondary | ICD-10-CM | POA: Diagnosis not present

## 2020-04-08 DIAGNOSIS — R531 Weakness: Secondary | ICD-10-CM | POA: Diagnosis not present

## 2020-04-08 DIAGNOSIS — I10 Essential (primary) hypertension: Secondary | ICD-10-CM | POA: Insufficient documentation

## 2020-04-08 DIAGNOSIS — R0789 Other chest pain: Secondary | ICD-10-CM | POA: Diagnosis not present

## 2020-04-08 DIAGNOSIS — I517 Cardiomegaly: Secondary | ICD-10-CM | POA: Diagnosis not present

## 2020-04-08 LAB — CBC
HCT: 34.8 % — ABNORMAL LOW (ref 36.0–46.0)
Hemoglobin: 10.7 g/dL — ABNORMAL LOW (ref 12.0–15.0)
MCH: 20 pg — ABNORMAL LOW (ref 26.0–34.0)
MCHC: 30.7 g/dL (ref 30.0–36.0)
MCV: 64.9 fL — ABNORMAL LOW (ref 80.0–100.0)
Platelets: 321 10*3/uL (ref 150–400)
RBC: 5.36 MIL/uL — ABNORMAL HIGH (ref 3.87–5.11)
RDW: 22.2 % — ABNORMAL HIGH (ref 11.5–15.5)
WBC: 5 10*3/uL (ref 4.0–10.5)
nRBC: 0 % (ref 0.0–0.2)

## 2020-04-08 LAB — COMPREHENSIVE METABOLIC PANEL
ALT: 19 U/L (ref 0–44)
AST: 21 U/L (ref 15–41)
Albumin: 3.5 g/dL (ref 3.5–5.0)
Alkaline Phosphatase: 57 U/L (ref 38–126)
Anion gap: 9 (ref 5–15)
BUN: 6 mg/dL (ref 6–20)
CO2: 23 mmol/L (ref 22–32)
Calcium: 9.2 mg/dL (ref 8.9–10.3)
Chloride: 108 mmol/L (ref 98–111)
Creatinine, Ser: 0.72 mg/dL (ref 0.44–1.00)
GFR, Estimated: >60 mL/min (ref >60)
Glucose, Bld: 125 mg/dL — ABNORMAL HIGH (ref 70–99)
Potassium: 4 mmol/L (ref 3.5–5.1)
Sodium: 140 mmol/L (ref 135–145)
Total Bilirubin: 0.4 mg/dL (ref 0.3–1.2)
Total Protein: 7.7 g/dL (ref 6.5–8.1)

## 2020-04-08 LAB — I-STAT BETA HCG BLOOD, ED (MC, WL, AP ONLY): I-stat hCG, quantitative: <5 m[IU]/mL (ref <5)

## 2020-04-08 LAB — TROPONIN I (HIGH SENSITIVITY)
Troponin I (High Sensitivity): 4 ng/L (ref <18)
Troponin I (High Sensitivity): 3 ng/L (ref <18)

## 2020-04-08 LAB — D-DIMER, QUANTITATIVE: D-Dimer, Quant: 0.71 ug/mL-FEU — ABNORMAL HIGH (ref 0.00–0.50)

## 2020-04-08 LAB — TYPE AND SCREEN
ABO/RH(D): A POS
Antibody Screen: NEGATIVE

## 2020-04-08 MED ORDER — SODIUM CHLORIDE 0.9 % IV BOLUS
1000.0000 mL | Freq: Once | INTRAVENOUS | Status: AC
  Administered 2020-04-08: 1000 mL via INTRAVENOUS

## 2020-04-08 MED ORDER — OMEPRAZOLE 20 MG PO CPDR: 20.0000 mg | DELAYED_RELEASE_CAPSULE | Freq: Every day | ORAL | 0 refills | Status: AC

## 2020-04-08 MED ORDER — IOHEXOL 350 MG/ML SOLN
60.0000 mL | Freq: Once | INTRAVENOUS | Status: AC | PRN
  Administered 2020-04-08: 60 mL via INTRAVENOUS

## 2020-04-08 NOTE — ED Notes (Signed)
Pt transported to CT ?

## 2020-04-08 NOTE — ED Notes (Signed)
Pt d/c home per MD order. Discharge summary reviewed with pt, pt verbalizes understanding. No s/s of acute distress noted at discharge. Discharge home with visitor.

## 2020-04-08 NOTE — Discharge Instructions (Addendum)
You been seen here for chest pain.  Lab work and imaging looked reassuring.  Start you on an acid pill please take as prescribed.  I like you to follow-up with your primary care provider and/or GI doctor for further evaluation.  I given contact above.  Come back to the emergency department if you develop chest pain, shortness of breath, severe abdominal pain, uncontrolled nausea, vomiting, diarrhea.

## 2020-04-08 NOTE — ED Provider Notes (Signed)
[facility] EMERGENCY DEPARTMENT Provider Note   CSN: [id number] Arrival date & time: 04/08/20  0744     History Chief Complaint  Patient presents with  . Dizziness  . Fatigue    Brittney Sandoval is a 40 y.o. female.  HPI   Patient with significant medical history of anemia, DVT, hypertension, PE presents with chief complaint of feeling weak and fatigued.  Patient endorses she woke up this morning not feeling well, she got out of bed and felt dizzy, she thought it would go away on its own but it never did.  She also endorses some left-sided chest pain, has been going on for the last couple months, she denies shortness of breath, becoming diaphoretic, becoming nauseous or vomiting, pain is nonexertional, not exacerbated by food.  She has no cardiac history, but did have a DVT and PE back in 2017.  She is currently not on any anticoag at this time.  Patient endorses that she is recent getting over Covid, diagnosed with Covid 2 weeks ago, she was COVID vaccinated.  She denies  alleviating factors at this time.  She states she is tolerating p.o. without difficulty.  Patient denies headaches, fevers, chills, shortness of breath, abdominal pain, nausea, vomiting, diarrhea, pedal edema.  Past Medical History:  Diagnosis Date  . Anemia   . DVT (deep venous thrombosis) ([place])    a. 11/2015 U/S: Left Peroneal DVT;  b. 02/2016 U/S: No DVT bilat.  . Essential hypertension    a. 01/2016 Echo: EF 60-65%, no rwma, triv MR/TR, PASP 58mmHg.  . Fibroid   . Pulmonary emboli ([place])    a. 11/2015 CTA Chest: PE involving the segmental and subsegmental branches of the RML & RLL-->Xarelto.    Patient Active Problem List   Diagnosis Date Noted  . Uncontrolled hypertension 01/18/2016  . Acute pulmonary embolism ([place]) 12/15/2015  . Anemia 11/27/2015  . S/P repeat low transverse C-section 11/26/2015  . Advanced maternal age in multigravida   . Sickle cell trait ([place]) 12/03/2012   . Benign essential hypertension, antepartum 12/03/2012  . 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 Obesity (BMI 30-39.9) 10/24/2012    Past Surgical History:  Procedure Laterality Date  . CESAREAN SECTION N/A 04/04/2013   Procedure: CESAREAN SECTION;  Surgeon: Emily Filbert, MD;  Location: [facility];  Service: Obstetrics;  Laterality: N/A;  . CESAREAN SECTION N/A 11/25/2015   Procedure: CESAREAN SECTION;  Surgeon: Everett Graff, MD;  Location: [facility];  Service: Obstetrics;  Laterality: N/A;  . LAPAROSCOPIC CHOLECYSTECTOMY  2010   [place], [place]  . TONSILLECTOMY       OB History    Gravida  5   Para  5   Term  5   Preterm      AB      Living  5     SAB      IAB      Ectopic      Multiple  0   Live Births  5           Family History  Problem Relation Age of Onset  . Diabetes Mother   . Hypertension Mother   . Kidney disease Mother   . Heart disease Mother     Social History   Tobacco Use  . Smoking status: Never Smoker  . Smokeless tobacco: Never Used  Vaping Use  . Vaping Use: Never used  Substance Use Topics  .  Alcohol use: Yes    Comment: occassionally  . Drug use: No    Home Medications Prior to Admission medications   Medication Sig Start Date End Date Taking? Authorizing Provider  amLODipine (NORVASC) 10 MG tablet TAKE 1 TABLET BY MOUTH DAILY Patient taking differently: Take 10 mg by mouth daily. 03/27/20  Yes Lelon Perla, MD  Cholecalciferol 25 MCG (1000 UT) tablet Take 2,000 Units by mouth daily.   Yes [provider]  ferrous sulfate 325 (65 FE) MG tablet Take 1 tablet by mouth 2 (two) times daily. 10/28/15  Yes [provider]  hydrochlorothiazide (HYDRODIURIL) 12.5 MG tablet Take 12.5 mg by mouth daily. 02/26/20  Yes [provider]  Multiple Vitamins-Minerals (MULTIVITAL PO) Take 1 tablet by mouth daily.   Yes [provider]  omeprazole (PRILOSEC) 20 MG capsule Take 1 capsule (20 mg total) by mouth daily. 04/08/20 05/08/20 Yes Marcello Fennel, PA-C  triamcinolone ointment (KENALOG) 0.1 % Apply 1 application topically as needed (break outs). 09/20/19  Yes [provider]  metoprolol tartrate (LOPRESSOR) 100 MG tablet TAKE 2 HOURS PRIOR TO CT SCAN Patient not taking: Reported on 04/08/2020 10/07/19   Lelon Perla, MD    Allergies    Patient has no known allergies.  Review of Systems   Review of Systems  Constitutional: Positive for fatigue. Negative for activity change, appetite change, chills and fever.  HENT: Negative for congestion and sore throat.   Respiratory: Negative for cough and shortness of breath.   Cardiovascular: Positive for chest pain.  Gastrointestinal: Negative for abdominal pain, diarrhea, nausea and vomiting.  Genitourinary: Negative for enuresis and vaginal pain.  Musculoskeletal: Negative for back pain.  Skin: Negative for rash.  Neurological: Positive for weakness. Negative for dizziness.  Hematological: Does not bruise/bleed easily.    Physical Exam Updated Vital Signs BP 128/81   Pulse 73   Temp (!) 97.5 F (36.4 C)   Resp 14   LMP 03/09/2020   SpO2 100%   Physical Exam Vitals and nursing note reviewed.  Constitutional:      General: She is not in acute distress.    Appearance: She is not ill-appearing.  HENT:     Head: Normocephalic and atraumatic.     Nose: No congestion.     Mouth/Throat:     Mouth: Mucous membranes are dry.     Pharynx: Oropharynx is clear. No oropharyngeal exudate or posterior oropharyngeal erythema.  Eyes:     Conjunctiva/sclera: Conjunctivae normal.  Cardiovascular:     Rate and Rhythm: Normal rate and regular rhythm.     Pulses: Normal pulses.     Heart sounds: No murmur heard. No friction rub. No gallop.   Pulmonary:     Effort: No respiratory distress.     Breath sounds: No wheezing, rhonchi or rales.   Abdominal:     Palpations: Abdomen is soft.     Tenderness: There is no abdominal tenderness.  Musculoskeletal:     Right lower leg: No edema.     Left lower leg: No edema.     Comments: Patient is moving all 4 extremities at difficulty.  Skin:    General: Skin is warm and dry.  Neurological:     Mental Status: She is alert.     Comments: Patient having no difficulty with word finding.  Psychiatric:        Mood and Affect: Mood normal.     ED Results / Procedures / Treatments  Labs (all labs ordered are listed, but only abnormal results are displayed) Labs Reviewed  COMPREHENSIVE METABOLIC PANEL - Abnormal; Notable for the following components:      Result Value   Glucose, Bld 125 (*)    All other components within normal limits  CBC - Abnormal; Notable for the following components:   RBC 5.36 (*)    Hemoglobin 10.7 (*)    HCT 34.8 (*)    MCV 64.9 (*)    MCH 20.0 (*)    RDW 22.2 (*)    All other components within normal limits  D-DIMER, QUANTITATIVE (NOT AT [facility]) - Abnormal; Notable for the following components:   D-Dimer, Quant 0.71 (*)    All other components within normal limits  I-STAT BETA HCG BLOOD, ED ([facility], [facility], [facility] ONLY)  TYPE AND SCREEN  TROPONIN I (HIGH SENSITIVITY)  TROPONIN I (HIGH SENSITIVITY)    EKG None  Radiology DG Chest 2 View  Result Date: 04/08/2020 CLINICAL DATA:  Chest pain EXAM: CHEST - 2 VIEW COMPARISON:  09/14/2016 FINDINGS: The heart size and mediastinal contours are within normal limits. Both lungs are clear. The visualized skeletal structures are unremarkable. IMPRESSION: No active cardiopulmonary disease. Electronically Signed   By: Inez Catalina M.D.   On: 04/08/2020 08:29   CT Angio Chest PE W and/or Wo Contrast  Result Date: 04/08/2020 CLINICAL DATA:  Left-sided chest pain for 1 year, history of PE EXAM: CT ANGIOGRAPHY CHEST WITH CONTRAST TECHNIQUE: Multidetector CT imaging of the chest was performed using the standard protocol during  bolus administration of intravenous contrast. Multiplanar CT image reconstructions and MIPs were obtained to evaluate the vascular anatomy. CONTRAST:  25mL OMNIPAQUE IOHEXOL 350 MG/ML SOLN COMPARISON:  CT chest angiogram, 09/14/2016 FINDINGS: Cardiovascular: Satisfactory opacification of the pulmonary arteries to the segmental level. No evidence of pulmonary embolism. Cardiomegaly. No pericardial effusion. Mediastinum/Nodes: No enlarged mediastinal, hilar, or axillary lymph nodes. Thymic remnant in the anterior mediastinum. Thyroid gland, trachea, and esophagus demonstrate no significant findings. Lungs/Pleura: Lungs are clear. No pleural effusion or pneumothorax. Upper Abdomen: No acute abnormality. Musculoskeletal: No chest wall abnormality. No acute or significant osseous findings. Review of the MIP images confirms the above findings. IMPRESSION: 1. Negative examination for pulmonary embolism. 2. Cardiomegaly. Electronically Signed   By: Eddie Candle M.D.   On: 04/08/2020 12:59    Procedures Procedures   Medications Ordered in ED Medications  sodium chloride 0.9 % bolus 1,000 mL (1,000 mLs Intravenous New Bag/Given 04/08/20 1038)  iohexol (OMNIPAQUE) 350 MG/ML injection 60 mL (60 mLs Intravenous Contrast Given 04/08/20 1239)    ED Course  I have reviewed the triage vital signs and the nursing notes.  Pertinent labs & imaging results that were available during my care of the patient were reviewed by me and considered in my medical decision making (see chart for details).    MDM Rules/Calculators/A&P                          Initial impression-patient presents with 1 day onset of fatigue and weakness with 3 months of chest pain.  She is alert, does not appear in acute distress, vital signs reassuring.  Triage obtained basic lab work-up.  Concern for PE, ACS, dehydration.  Will add on D-dimer, obtain orthostatic vital signs, provide patient with fluids and reevaluate.  Work-up-CBC negative for  leukocytosis, shows microcytic anemia appears to be a baseline for patient, CMP unremarkable.  Initial troponin is 4,  second troponin 3 i-STAT hCG less than 5.  D-dimer slightly elevated 0.71.  Chest x-ray negative for acute findings.  EKG sinus rhythm not signs of ischemia no ST elevation depression noted. CT angios negative for PE or other acute findings.  Reassessment patient was reassessed, she continues to have no complaints at this time, vital signs have remained stable.  Made patient aware that her D-dimer is elevated and would like to send her down for CT angio for PE exclusion.  Patient is agreeable to this.  Patient was reassessed after providing her with fluids, she has no complaints at this time.  Vital signs remained stable.  Patient is comfortable going home.  Rule out- I have low suspicion for ACS as history is atypical, patient has no cardiac history, EKG was sinus rhythm without signs of ischemia, patient had a delta troponin.  Low suspicion for PE as CT angio was negative.  Low suspicion for AAA or aortic dissection as history is atypical, patient has low risk factors.  Low suspicion for systemic infection as patient is nontoxic-appearing, vital signs reassuring, no obvious source infection noted on exam.  Low suspicion for CVA or intracranial head bleed as patient denies recent head trauma, is not on anticoagulant, there is no neurodeficits on my exam.   Plan-patient has chest pain with unknown etiology, differential diagnosis includes costochondritis, GERD, esophagus spasms, anxiety.  Will recommend over-the-counter pain medications, PPI follow-up with GI for further evaluation.  Vital signs have remained stable, no indication for hospital admission.   Patient given at home care as well strict return precautions.  Patient verbalized that they understood agreed to said plan.   Final Clinical Impression(s) / ED Diagnoses Final diagnoses:  Weakness  Dizziness  Chest pain,  unspecified type    Rx / DC Orders ED Discharge Orders         Ordered    omeprazole (PRILOSEC) 20 MG capsule  Daily        04/08/20 1336           Aron Baba 04/08/20 1340    Elnora Morrison, MD 04/10/20 1240

## 2020-04-08 NOTE — ED Notes (Signed)
Walked patient to the bathroom patient did well 

## 2020-04-08 NOTE — ED Triage Notes (Signed)
Pt reports waking up not feeling well: dizziness, fatigue, slight chest pain. Hx of anemia, last hgb drawn on 1/27 was 9.7. Denies fever or cough

## 2020-04-27 DIAGNOSIS — R1013 Epigastric pain: Secondary | ICD-10-CM | POA: Diagnosis not present

## 2020-04-27 DIAGNOSIS — D509 Iron deficiency anemia, unspecified: Secondary | ICD-10-CM | POA: Diagnosis not present

## 2020-05-08 DIAGNOSIS — M5412 Radiculopathy, cervical region: Secondary | ICD-10-CM | POA: Diagnosis not present

## 2020-05-16 NOTE — Progress Notes (Signed)
HPI: Follow-up hypertension and palpitations. Patient delivered by C-section on 11/25/2015. She was found to have a pulmonary embolus in October 2017 and there was note of DVT on lower extremity venous Dopplers. Shewas treated withxarelto. Echocardiogram December 2017 showed normal LV systolic function. Stress echocardiogram January 2019 normal. Monitor May 2021 showed transient Mobitz 1 second-degree AV block in the early morning hours.  Cardiac CTA September 2021 showed no coronary disease and calcium score 0.  CTA February 2022 showed no pulmonary embolus.  Since last seen,  last week the patient had pain in the left lower rib area.  It was continuous for 5 days and increased with inspiration and certain movements.  No associated symptoms.  She denies dyspnea, syncope.  She occasionally feels a brief flutter but no sustained palpitations.  Current Outpatient Medications  Medication Sig Dispense Refill   amLODipine (NORVASC) 10 MG tablet TAKE 1 TABLET BY MOUTH DAILY (Patient taking differently: Take 10 mg by mouth daily.) 90 tablet 3   Cholecalciferol 25 MCG (1000 UT) tablet Take 2,000 Units by mouth daily.     ferrous sulfate 325 (65 FE) MG tablet Take 1 tablet by mouth 2 (two) times daily.  1   hydrochlorothiazide (HYDRODIURIL) 12.5 MG tablet Take 12.5 mg by mouth daily.     metoprolol tartrate (LOPRESSOR) 100 MG tablet TAKE 2 HOURS PRIOR TO CT SCAN 1 tablet 0   Multiple Vitamins-Minerals (MULTIVITAL PO) Take 1 tablet by mouth daily.     triamcinolone ointment (KENALOG) 0.1 % Apply 1 application topically as needed (break outs).     omeprazole (PRILOSEC) 20 MG capsule Take 1 capsule (20 mg total) by mouth daily. 30 capsule 0   No current facility-administered medications for this visit.     Past Medical History:  Diagnosis Date   Anemia    DVT (deep venous thrombosis) ([place])    a. 11/2015 U/S: Left Peroneal DVT;  b. 02/2016 U/S: No DVT bilat.   Essential hypertension     a. 01/2016 Echo: EF 60-65%, no rwma, triv MR/TR, PASP 58mmHg.   Fibroid    Pulmonary emboli ([place])    a. 11/2015 CTA Chest: PE involving the segmental and subsegmental branches of the RML & RLL-->Xarelto.    Past Surgical History:  Procedure Laterality Date   CESAREAN SECTION N/A 04/04/2013   Procedure: CESAREAN SECTION;  Surgeon: Emily Filbert, MD;  Location: [facility];  Service: Obstetrics;  Laterality: N/A;   CESAREAN SECTION N/A 11/25/2015   Procedure: CESAREAN SECTION;  Surgeon: Everett Graff, MD;  Location: [facility];  Service: Obstetrics;  Laterality: N/A;   LAPAROSCOPIC CHOLECYSTECTOMY  2010   [place], [place]   TONSILLECTOMY      Social History   Socioeconomic History   Marital status: Single    Spouse name: Not on file   Number of children: Not on file   Years of education: Not on file   Highest education level: Not on file  Occupational History   Not on file  Tobacco Use   Smoking status: Never Smoker   Smokeless tobacco: Never Used  Vaping Use   Vaping Use: Never used  Substance and Sexual Activity   Alcohol use: Yes    Comment: occassionally   Drug use: No   Sexual activity: Yes    Birth control/protection: None  Other Topics Concern   Not on file  Social History Narrative   Lives in [place].  Relocated to [place] a few years  ago.   Social Determinants of Radio broadcast assistant Strain: Not on file  Food Insecurity: Not on file  Transportation Needs: Not on file  Physical Activity: Not on file  Stress: Not on file  Social Connections: Not on file  Intimate Partner Violence: Not on file    Family History  Problem Relation Age of Onset   Diabetes Mother    Hypertension Mother    Kidney disease Mother    Heart disease Mother     ROS: no fevers or chills, productive cough, hemoptysis, dysphasia, odynophagia, melena, hematochezia, dysuria, hematuria, rash, seizure activity, orthopnea, PND, pedal  edema, claudication. Remaining systems are negative.  Physical Exam: Well-developed well-nourished in no acute distress.  Skin is warm and dry.  HEENT is normal.  Neck is supple.  Chest is clear to auscultation with normal expansion.  Cardiovascular exam is regular rate and rhythm.  Abdominal exam nontender or distended. No masses palpated. Extremities show no edema. neuro grossly intact  ECG-normal sinus rhythm at a rate of 69, first-degree AV block, nonspecific ST changes.  Personally reviewed  A/P  1 palpitations-previous monitor showed no significant arrhythmias.  Continue to follow.  2 chest pain-patient complained of chest pain last week for 5 continuous days that increased with certain movements and inspiration.  Also tender to palpation and likely musculoskeletal.  Previous CTA showed no coronary disease and recent CTA showed no pulmonary embolus.  Likely musculoskeletal pain.  We will not pursue further cardiac evaluation.  3 hypertension-blood pressure controlled.  Continue present medications at present dose and follow.  [name], MD

## 2020-05-19 ENCOUNTER — Encounter: Payer: Self-pay | Admitting: *Deleted

## 2020-05-19 ENCOUNTER — Other Ambulatory Visit: Payer: Self-pay

## 2020-05-19 ENCOUNTER — Ambulatory Visit (INDEPENDENT_AMBULATORY_CARE_PROVIDER_SITE_OTHER): Payer: Medicaid Other | Admitting: Cardiology

## 2020-05-19 ENCOUNTER — Encounter: Payer: Self-pay | Admitting: Cardiology

## 2020-05-19 VITALS — BP 124/86 | HR 69 | Ht 64.5 in | Wt 223.0 lb

## 2020-05-19 DIAGNOSIS — R1013 Epigastric pain: Secondary | ICD-10-CM | POA: Diagnosis not present

## 2020-05-19 DIAGNOSIS — N920 Excessive and frequent menstruation with regular cycle: Secondary | ICD-10-CM | POA: Diagnosis not present

## 2020-05-19 DIAGNOSIS — R072 Precordial pain: Secondary | ICD-10-CM | POA: Diagnosis not present

## 2020-05-19 DIAGNOSIS — R002 Palpitations: Secondary | ICD-10-CM

## 2020-05-19 DIAGNOSIS — D500 Iron deficiency anemia secondary to blood loss (chronic): Secondary | ICD-10-CM | POA: Diagnosis not present

## 2020-05-19 DIAGNOSIS — I10 Essential (primary) hypertension: Secondary | ICD-10-CM

## 2020-05-19 DIAGNOSIS — N739 Female pelvic inflammatory disease, unspecified: Secondary | ICD-10-CM | POA: Diagnosis not present

## 2020-05-19 NOTE — Patient Instructions (Signed)

## 2020-05-21 DIAGNOSIS — Z1231 Encounter for screening mammogram for malignant neoplasm of breast: Secondary | ICD-10-CM | POA: Diagnosis not present

## 2020-05-29 DIAGNOSIS — D500 Iron deficiency anemia secondary to blood loss (chronic): Secondary | ICD-10-CM | POA: Diagnosis not present

## 2020-05-29 DIAGNOSIS — R1013 Epigastric pain: Secondary | ICD-10-CM | POA: Diagnosis not present

## 2020-05-29 DIAGNOSIS — K644 Residual hemorrhoidal skin tags: Secondary | ICD-10-CM | POA: Diagnosis not present

## 2020-05-29 DIAGNOSIS — K641 Second degree hemorrhoids: Secondary | ICD-10-CM | POA: Diagnosis not present

## 2020-06-24 DIAGNOSIS — N898 Other specified noninflammatory disorders of vagina: Secondary | ICD-10-CM | POA: Diagnosis not present

## 2020-06-24 DIAGNOSIS — E559 Vitamin D deficiency, unspecified: Secondary | ICD-10-CM | POA: Diagnosis not present

## 2020-06-24 DIAGNOSIS — D509 Iron deficiency anemia, unspecified: Secondary | ICD-10-CM | POA: Diagnosis not present

## 2020-06-24 DIAGNOSIS — I10 Essential (primary) hypertension: Secondary | ICD-10-CM | POA: Diagnosis not present

## 2020-06-24 DIAGNOSIS — Z6838 Body mass index (BMI) 38.0-38.9, adult: Secondary | ICD-10-CM | POA: Diagnosis not present

## 2020-06-24 DIAGNOSIS — E6609 Other obesity due to excess calories: Secondary | ICD-10-CM | POA: Diagnosis not present

## 2020-09-11 DIAGNOSIS — N898 Other specified noninflammatory disorders of vagina: Secondary | ICD-10-CM | POA: Diagnosis not present

## 2020-09-11 DIAGNOSIS — I10 Essential (primary) hypertension: Secondary | ICD-10-CM | POA: Diagnosis not present

## 2020-09-11 DIAGNOSIS — M792 Neuralgia and neuritis, unspecified: Secondary | ICD-10-CM | POA: Diagnosis not present

## 2020-09-11 DIAGNOSIS — N912 Amenorrhea, unspecified: Secondary | ICD-10-CM | POA: Diagnosis not present

## 2020-09-11 DIAGNOSIS — R52 Pain, unspecified: Secondary | ICD-10-CM | POA: Diagnosis not present

## 2020-10-26 DIAGNOSIS — J111 Influenza due to unidentified influenza virus with other respiratory manifestations: Secondary | ICD-10-CM | POA: Diagnosis not present

## 2020-10-28 DIAGNOSIS — R52 Pain, unspecified: Secondary | ICD-10-CM | POA: Diagnosis not present

## 2020-10-28 DIAGNOSIS — F419 Anxiety disorder, unspecified: Secondary | ICD-10-CM | POA: Diagnosis not present

## 2020-10-28 DIAGNOSIS — F4329 Adjustment disorder with other symptoms: Secondary | ICD-10-CM | POA: Diagnosis not present

## 2020-12-22 ENCOUNTER — Telehealth: Payer: Self-pay | Admitting: Cardiology

## 2020-12-22 NOTE — Telephone Encounter (Signed)
Received a call from patient she stated for the past 1 month she has been having chest pain and sob.She stated episodes getting worse.No chest pain at present.Appointment scheduled with Laurann Montana NP 10/26 at 8:00 am at [facility] location.

## 2020-12-22 NOTE — Telephone Encounter (Signed)
Pt c/o of Chest Pain: STAT if CP now or developed within 24 hours  1. Are you having CP right now?  Yes  2. Are you experiencing any other symptoms (ex. SOB, nausea, vomiting, sweating)?  SOB-about 1 week, with and w/o exertion. States she is somewhat SOB right now, with just talking on the phone   3. How long have you been experiencing CP?  About 1 year  4. Is your CP continuous or coming and going?  Coming and going   5. Have you taken Nitroglycerin?  No, patient states she has never been prescribed any nitro  ?

## 2020-12-23 ENCOUNTER — Encounter (HOSPITAL_BASED_OUTPATIENT_CLINIC_OR_DEPARTMENT_OTHER): Payer: Self-pay | Admitting: Family

## 2020-12-23 ENCOUNTER — Other Ambulatory Visit: Payer: Self-pay

## 2020-12-23 ENCOUNTER — Ambulatory Visit (HOSPITAL_BASED_OUTPATIENT_CLINIC_OR_DEPARTMENT_OTHER): Payer: Medicaid Other | Admitting: Family

## 2020-12-23 VITALS — BP 130/90 | HR 73 | Ht 64.0 in | Wt 221.1 lb

## 2020-12-23 DIAGNOSIS — R0602 Shortness of breath: Secondary | ICD-10-CM | POA: Diagnosis not present

## 2020-12-23 DIAGNOSIS — R079 Chest pain, unspecified: Secondary | ICD-10-CM | POA: Diagnosis not present

## 2020-12-23 DIAGNOSIS — I10 Essential (primary) hypertension: Secondary | ICD-10-CM

## 2020-12-23 DIAGNOSIS — R002 Palpitations: Secondary | ICD-10-CM

## 2020-12-23 NOTE — Patient Instructions (Addendum)
Medication Instructions:  Your Physician recommend you continue on your current medication as directed.    *If you need a refill on your cardiac medications before your next appointment, please call your pharmacy*   Lab Work: CBC today on the 3rd floor If you have labs (blood work) drawn today and your tests are completely normal, you will receive your results only by: [facility] (if you have MyChart) OR A paper copy in the mail If you have any lab test that is abnormal or we need to change your treatment, we will call you to review the results.   Testing/Procedures: Your physician has requested that you have an echocardiogram. Echocardiography is a painless test that uses sound waves to create images of your heart. It provides your doctor with information about the size and shape of your heart and how well your heart's chambers and valves are working. This procedure takes approximately one hour. There are no restrictions for this procedure. [facility], you and your health needs are our priority.  As part of our continuing mission to provide you with exceptional heart care, we have created designated Provider Care Teams.  These Care Teams include your primary Cardiologist (physician) and Advanced Practice Providers (APPs -  Physician Assistants and Nurse Practitioners) who all work together to provide you with the care you need, when you need it.  We recommend signing up for the patient portal called "MyChart".  Sign up information is provided on this After Visit Summary.  MyChart is used to connect with patients for Virtual Visits (Telemedicine).  Patients are able to view lab/test results, encounter notes, upcoming appointments, etc.  Non-urgent messages can be sent to your provider as well.   To learn more about what you can do with MyChart, go to [email or web address].    Your next appointment:   6 month(s)  The format for  your next appointment:   In Person  Provider:   You may see [name], MD or one of the following Advanced Practice Providers on your designated Care Team:   Sande Rives, PA-C Coletta Memos, FNP Laurann Montana, FNP

## 2020-12-23 NOTE — Progress Notes (Signed)
Office Visit    Patient Name: Brittney Sandoval Date of Encounter: 12/23/2020  PCP:  Gerald Leitz   [place]  Cardiologist:  [name], MD  Advanced Practice Provider:  No care team member to display Electrophysiologist:  None      Chief Complaint    Brittney Sandoval is a 40 y.o. female with a hx of hypertension, palpitations presents today for chest pain  Past Medical History    Past Medical History:  Diagnosis Date   Anemia    DVT (deep venous thrombosis) ([place])    a. 11/2015 U/S: Left Peroneal DVT;  b. 02/2016 U/S: No DVT bilat.   Essential hypertension    a. 01/2016 Echo: EF 60-65%, no rwma, triv MR/TR, PASP 65mmHg.   Fibroid    Pulmonary emboli ([place])    a. 11/2015 CTA Chest: PE involving the segmental and subsegmental branches of the RML & RLL-->Xarelto.   Past Surgical History:  Procedure Laterality Date   CESAREAN SECTION N/A 04/04/2013   Procedure: CESAREAN SECTION;  Surgeon: Emily Filbert, MD;  Location: [facility];  Service: Obstetrics;  Laterality: N/A;   CESAREAN SECTION N/A 11/25/2015   Procedure: CESAREAN SECTION;  Surgeon: Everett Graff, MD;  Location: [facility];  Service: Obstetrics;  Laterality: N/A;   LAPAROSCOPIC CHOLECYSTECTOMY  2010   [place]   TONSILLECTOMY      Allergies  No Known Allergies  History of Present Illness    Brittney Sandoval is a 40 y.o. female with a hx of hypertension, palpitations last seen 05/19/2020 by Dr. Stanford Breed.  She had a pulmonary embolism in October 2017 shortly after her C-section.  There was note of DVT on lower extremity venous Dopplers.  She was treated with Xarelto.  Echo December 2017 with normal LVEF.  Stress echo January 2019 normal.  Monitor May 2021 with transient Mobitz 1 second-degree AV block and early morning hours.  Cardiac CTA September 2021 with coronary calcium score of 0.  CTA February 2022 with no  pulmonary embolus.  When last seen 05/19/2020 she noted pain in the left lower rib area for 5 days continuously worse with inspiration.  It was tender on palpation and deemed musculoskeletal.  Presents today for follow-up. 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   Tells me her chest pain has been ongoing for a couple of years. It is persistent on the left side of her body which she describes as stiffness. It is predominantly under her left breast and around her chest wall. She had nerve test, MRI, cardiac CT with no acute findings. It is occurring daily and lasting longer. She has a CT chest upcoming Friday. Notes she feels short of breath with even speaking long periods of time. Notes dyspnea with exertion with activities that she used to be able to complete without difficulty.    EKGs/Labs/Other Studies Reviewed:   The following studies were reviewed today:   EKG:  EKG is  ordered today.  The ekg ordered today demonstrates NSR 73 bpm with no acute ST/T wave changes.   Recent Labs: 04/08/2020: ALT 19; BUN 6; Creatinine, Ser 0.72; Hemoglobin 10.7; Platelets 321; Potassium 4.0; Sodium 140  Recent Lipid Panel No results found for: CHOL, TRIG, HDL, CHOLHDL, VLDL, LDLCALC, LDLDIRECT Home Medications   Current Meds  Medication Sig   amLODipine (NORVASC) 10 MG tablet TAKE 1 TABLET BY  MOUTH DAILY (Patient taking differently: Take 10 mg by mouth daily.)   Cholecalciferol 25 MCG (1000 UT) tablet Take 2,000 Units by mouth daily.   ferrous sulfate 325 (65 FE) MG tablet Take 1 tablet by mouth 2 (two) times daily.   Multiple Vitamins-Minerals (MULTIVITAL PO) Take 1 tablet by mouth daily.     Review of Systems      All other systems reviewed and are otherwise negative except as noted above.  Physical Exam    VS:  BP 130/90   Pulse 73   Ht 5\' 4"  (1.626 m)   Wt 221 lb 1.6 oz (100.3 kg)   SpO2 98%   BMI 37.95  kg/m  , BMI Body mass index is 37.95 kg/m.  Wt Readings from Last 3 Encounters:  12/23/20 221 lb 1.6 oz (100.3 kg)  05/19/20 223 lb (101.2 kg)  10/07/19 233 lb (105.7 kg)     GEN: Well nourished, well developed, in no acute distress. HEENT: normal. Neck: Supple, no JVD, carotid bruits, or masses. Cardiac: RRR, no murmurs, rubs, or gallops. No clubbing, cyanosis, edema.  Radials/PT 2+ and equal bilaterally.  Respiratory:  Respirations regular and unlabored, clear to auscultation bilaterally. GI: Soft, nontender, nondistended. MS: No deformity or atrophy. Skin: Warm and dry, no rash. Neuro:  Strength and sensation are intact. Psych: Normal affect.  Assessment & Plan    Chest pain / Shortness of breath -Cardiac CTA 10/2019 with coronary calcium score of 0. Endorses persistent pain under left breast undergoing workup with PCP. Tender on palpation, likely musculoskeletal etiology. EKG today NSR no acute changes. No indication for repeat ischemic evaluation. Given dyspnea on exertion, will update echocardiogram to rule out HF or valvular abnormality. CBC today to rule out anemia as contributory.  If unremarkable, consider referral to pulmonology.   Palpitations - Previous monitor with no significant arrhythmia. No recurrent palpitations.   Hypertension - BPmildly elevated. Not taking HCTZ regularly. Discussed can take in evening with Amlodipine.   Anemia - Possible etiology of dyspnea. CBC ordered.   Disposition: Follow up in 6 month(s) with Dr. Stanford Breed or APP.  Signed, Loel Dubonnet, NP 12/23/2020, 8:16 AM [place]

## 2020-12-24 LAB — CBC
Hematocrit: 29 % — ABNORMAL LOW (ref 34.0–46.6)
Hemoglobin: 8 g/dL — ABNORMAL LOW (ref 11.1–15.9)
MCH: 16.4 pg — ABNORMAL LOW (ref 26.6–33.0)
MCHC: 27.6 g/dL — ABNORMAL LOW (ref 31.5–35.7)
MCV: 59 fL — ABNORMAL LOW (ref 79–97)
NRBC: 1 % — ABNORMAL HIGH (ref 0–0)
Platelets: 471 10*3/uL — ABNORMAL HIGH (ref 150–450)
RBC: 4.89 x10E6/uL (ref 3.77–5.28)
RDW: 23.7 % — ABNORMAL HIGH (ref 11.7–15.4)
WBC: 6.4 10*3/uL (ref 3.4–10.8)

## 2020-12-25 DIAGNOSIS — J984 Other disorders of lung: Secondary | ICD-10-CM | POA: Diagnosis not present

## 2020-12-25 DIAGNOSIS — R079 Chest pain, unspecified: Secondary | ICD-10-CM | POA: Diagnosis not present

## 2020-12-25 DIAGNOSIS — R0602 Shortness of breath: Secondary | ICD-10-CM | POA: Diagnosis not present

## 2020-12-30 ENCOUNTER — Other Ambulatory Visit (HOSPITAL_BASED_OUTPATIENT_CLINIC_OR_DEPARTMENT_OTHER): Payer: Medicaid Other

## 2021-01-05 ENCOUNTER — Other Ambulatory Visit (HOSPITAL_BASED_OUTPATIENT_CLINIC_OR_DEPARTMENT_OTHER): Payer: Medicaid Other

## 2021-03-04 ENCOUNTER — Other Ambulatory Visit (HOSPITAL_BASED_OUTPATIENT_CLINIC_OR_DEPARTMENT_OTHER): Payer: Medicaid Other

## 2021-03-09 DIAGNOSIS — R0602 Shortness of breath: Secondary | ICD-10-CM | POA: Diagnosis not present

## 2021-03-16 ENCOUNTER — Ambulatory Visit (INDEPENDENT_AMBULATORY_CARE_PROVIDER_SITE_OTHER): Payer: Medicaid Other

## 2021-03-16 ENCOUNTER — Other Ambulatory Visit: Payer: Self-pay

## 2021-03-16 DIAGNOSIS — R0602 Shortness of breath: Secondary | ICD-10-CM

## 2021-03-16 DIAGNOSIS — R079 Chest pain, unspecified: Secondary | ICD-10-CM | POA: Diagnosis not present

## 2021-03-16 LAB — ECHOCARDIOGRAM COMPLETE
AR max vel: 2.54 cm2
AV Area VTI: 2.23 cm2
AV Area mean vel: 2.32 cm2
AV Mean grad: 6 mmHg
AV Peak grad: 11.7 mmHg
Ao pk vel: 1.71 m/s
Area-P 1/2: 3.99 cm2
Calc EF: 68 %
S' Lateral: 3.16 cm
Single Plane A2C EF: 60.9 %
Single Plane A4C EF: 72.5 %

## 2021-03-17 NOTE — Progress Notes (Signed)
Seen by patient [name] on 03/16/2021  3:57 PM

## 2021-03-24 DIAGNOSIS — Z136 Encounter for screening for cardiovascular disorders: Secondary | ICD-10-CM | POA: Diagnosis not present

## 2021-03-24 DIAGNOSIS — I10 Essential (primary) hypertension: Secondary | ICD-10-CM | POA: Diagnosis not present

## 2021-03-24 DIAGNOSIS — Z13220 Encounter for screening for lipoid disorders: Secondary | ICD-10-CM | POA: Diagnosis not present

## 2021-04-11 DIAGNOSIS — J111 Influenza due to unidentified influenza virus with other respiratory manifestations: Secondary | ICD-10-CM | POA: Diagnosis not present

## 2021-04-14 DIAGNOSIS — R0602 Shortness of breath: Secondary | ICD-10-CM | POA: Diagnosis not present

## 2021-05-17 DIAGNOSIS — Z113 Encounter for screening for infections with a predominantly sexual mode of transmission: Secondary | ICD-10-CM | POA: Diagnosis not present

## 2021-06-16 DIAGNOSIS — J029 Acute pharyngitis, unspecified: Secondary | ICD-10-CM | POA: Diagnosis not present

## 2021-06-16 DIAGNOSIS — Z20822 Contact with and (suspected) exposure to covid-19: Secondary | ICD-10-CM | POA: Diagnosis not present

## 2021-06-16 DIAGNOSIS — H9209 Otalgia, unspecified ear: Secondary | ICD-10-CM | POA: Diagnosis not present

## 2021-07-22 DIAGNOSIS — N920 Excessive and frequent menstruation with regular cycle: Secondary | ICD-10-CM | POA: Diagnosis not present

## 2021-07-22 DIAGNOSIS — N898 Other specified noninflammatory disorders of vagina: Secondary | ICD-10-CM | POA: Diagnosis not present

## 2021-07-22 DIAGNOSIS — Z01419 Encounter for gynecological examination (general) (routine) without abnormal findings: Secondary | ICD-10-CM | POA: Diagnosis not present

## 2021-07-22 DIAGNOSIS — Z0000 Encounter for general adult medical examination without abnormal findings: Secondary | ICD-10-CM | POA: Diagnosis not present

## 2021-07-22 DIAGNOSIS — Z124 Encounter for screening for malignant neoplasm of cervix: Secondary | ICD-10-CM | POA: Diagnosis not present

## 2021-07-22 DIAGNOSIS — Z1231 Encounter for screening mammogram for malignant neoplasm of breast: Secondary | ICD-10-CM | POA: Diagnosis not present

## 2021-07-22 DIAGNOSIS — Z139 Encounter for screening, unspecified: Secondary | ICD-10-CM | POA: Diagnosis not present

## 2021-07-22 DIAGNOSIS — Z113 Encounter for screening for infections with a predominantly sexual mode of transmission: Secondary | ICD-10-CM | POA: Diagnosis not present

## 2021-08-05 ENCOUNTER — Telehealth: Payer: Self-pay | Admitting: Cardiology

## 2021-08-05 NOTE — Telephone Encounter (Signed)
Patient reports shooting pain that comes and goes at left chest, rating it as 5/10 when it occurs. Patient denies SOB, cough, chest heaviness, or nocturnal dyspnea. She stated she is to see a neurologist for the pain in her left neck and left side of her body. Recommended to patient that if she begins to have a cough or sob, she needs to be taken to ED (H/O DVT). She voiced understanding.

## 2021-08-05 NOTE — Telephone Encounter (Signed)
  Per MyChart scheduling message:  Pt c/o of Chest Pain: STAT if CP now or developed within 24 hours  1. Are you having CP right now?   2. Are you experiencing any other symptoms (ex. SOB, nausea, vomiting, sweating)?   3. How long have you been experiencing CP?   4. Is your CP continuous or coming and going?   5. Have you taken Nitroglycerin?    ? I am  currently not having CP. I do experience nausea however it could be due to another medication I take. It comes and goes and I have been having it for sometime now. Monday my left arm and shoulder was sore and heavy but it went away. It started Sunday night. I took asprin. I have not been prescribed nitro. Donnald Garre been told that I dont have heart issues other than the right side being slightly enlarged.

## 2021-08-08 NOTE — Progress Notes (Deleted)
Office Visit    Patient Name: Brittney Sandoval Date of Encounter: 08/08/2021  PCP:  Brittney Sandoval   [place]  Cardiologist:  Brittney Ruths, MD  Advanced Practice Provider:  No care team member to display Electrophysiologist:  None      Chief Complaint    Brittney Sandoval is a 41 y.o. female with a hx of hypertension, palpitations presents today for chest pain  Past Medical History    Past Medical History:  Diagnosis Date   Anemia    DVT (deep venous thrombosis) ([place])    a. 11/2015 U/S: Left Peroneal DVT;  b. 02/2016 U/S: No DVT bilat.   Essential hypertension    a. 01/2016 Echo: EF 60-65%, no rwma, triv MR/TR, PASP 40mHg.   Fibroid    Pulmonary emboli ([place]    a. 11/2015 CTA Chest: PE involving the segmental and subsegmental branches of the RML & RLL-->Xarelto.   Past Surgical History:  Procedure Laterality Date   CESAREAN SECTION N/A 04/04/2013   Procedure: CESAREAN SECTION;  Surgeon: Brittney Filbert MD;  [facility];  Service: Obstetrics;  Laterality: N/A;   CESAREAN SECTION N/A 11/25/2015   Procedure: CESAREAN SECTION;  Surgeon: Brittney Graff MD;  [place]  Service: Obstetrics;  Laterality: N/A;   [place]  [place]  [place]      Allergies  No Known Allergies  History of Present Illness    MReannonADanissa Rundleis a 41y.o. female with a hx of hypertension, palpitations last seen 11/2020.  She had a pulmonary embolism in October 2017 shortly after her C-section.  There was note of DVT on lower extremity venous Dopplers.  She was treated with Xarelto.  Echo December 2017 with normal LVEF.  Stress echo January 2019 normal.  Monitor May 2021 with transient Mobitz 1 second-degree AV block and early morning hours.  Cardiac CTA September 2021 with coronary calcium score of 0.  CTA February 2022 with no pulmonary  embolus.  When  seen 05/19/2020 she noted pain in the left lower rib area for 5 days continuously worse with inspiration.  It was tender on palpation and deemed musculoskeletal.Seen 11/2020 noting chest pain which was tender on palpation deemed musculoskeletal. Updated echo ordered due to dyspnea revealing normal LVEF and no significant valvular abnormalities.    Presents today for follow-up after contact office noting left sided occasional chest pain described as shooting with no associated symptoms. 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 It is persistent on the left side of her body which she describes as stiffness. It is predominantly under her left breast and around her chest wall. She had nerve test, MRI, cardiac CT with no acute findings. It is occurring daily and lasting longer. She has a CT chest upcoming Friday. Notes she feels short of breath with even speaking long periods of time. Notes dyspnea with exertion with activities that she used to be able to complete without difficulty.   She presents today for follow up. ***  EKGs/Labs/Other Studies Reviewed:   The following studies were reviewed today:  Echo 02/2021  1. Left ventricular ejection fraction by 3D volume is 66 %. The left  ventricle has normal function. The left ventricle has no regional wall  motion abnormalities.  Left ventricular diastolic parameters were normal.  The average left ventricular global  longitudinal strain is -20.3 %. The global longitudinal strain is normal.   2. Right ventricular systolic function is normal. The right ventricular  size is mildly enlarged.   3. Left atrial size was mildly dilated.   4. The mitral valve is normal in structure. No evidence of mitral valve  regurgitation. No evidence of mitral stenosis.   5. The aortic valve is normal in  structure. Aortic valve regurgitation is  not visualized. No aortic stenosis is present.   6. The inferior vena cava is normal in size with greater than 50%  respiratory variability, suggesting right atrial pressure of 3 mmHg.   Comparison(s): EF 60%, mild MV leaflet thickening.   EKG:  EKG is  ordered today.  The ekg ordered today demonstrates NSR 73 bpm with no acute ST/T wave changes. ***  Recent Labs: 12/23/2020: Hemoglobin 8.0; Platelets 471  Recent Lipid Panel No results found for: "CHOL", "TRIG", "HDL", "CHOLHDL", "VLDL", "LDLCALC", "LDLDIRECT" Home Medications   No outpatient medications have been marked as taking for the 08/09/21 encounter (Appointment) with Brittney Dubonnet, NP.     Review of Systems      All other systems reviewed and are otherwise negative except as noted above.  Physical Exam    VS:  There were no vitals taken for this visit. , BMI There is no height or weight on file to calculate BMI.  Wt Readings from Last 3 Encounters:  12/23/20 221 lb 1.6 oz (100.3 kg)  05/19/20 223 lb (101.2 kg)  10/07/19 233 lb (105.7 kg)    *** GEN: Well nourished, well developed, in no acute distress. HEENT: normal. Neck: Supple, no JVD, carotid bruits, or masses. Cardiac: RRR, no murmurs, rubs, or gallops. No clubbing, cyanosis, edema.  Radials/PT 2+ and equal bilaterally.  Respiratory:  Respirations regular and unlabored, clear to auscultation bilaterally. GI: Soft, nontender, nondistended. MS: No deformity or atrophy. Skin: Warm and dry, no rash. Neuro:  Strength and sensation are intact. Psych: Normal affect.  Assessment & Plan    Chest pain Cardiac CTA 10/2019 with coronary calcium score of 0. Echo 02/2021 normal LVEF, no significant valvular abnormalities. ***Endorses persistent pain under left breast undergoing workup with PCP. Tender on palpation, likely musculoskeletal etiology. EKG today NSR no acute changes. No indication for repeat ischemic evaluation. Given  dyspnea on exertion, will update echocardiogram to rule out HF or valvular abnormality. CBC today to rule out anemia as contributory.  If unremarkable, consider referral to pulmonology. ***  Palpitations - Previous monitor with no significant arrhythmia. No recurrent palpitations. ***  Hypertension -. ***  Disposition: Follow up in 6 month(s) ***with Dr. Stanford Breed or APP.  Signed, Brittney Dubonnet, NP 08/08/2021, 8:47 PM [place]

## 2021-08-09 ENCOUNTER — Ambulatory Visit (HOSPITAL_BASED_OUTPATIENT_CLINIC_OR_DEPARTMENT_OTHER): Payer: Medicaid Other | Admitting: Family

## 2021-08-09 ENCOUNTER — Other Ambulatory Visit: Payer: Self-pay | Admitting: Obstetrics and Gynecology

## 2021-08-09 DIAGNOSIS — N920 Excessive and frequent menstruation with regular cycle: Secondary | ICD-10-CM | POA: Diagnosis not present

## 2021-08-10 DIAGNOSIS — R7309 Other abnormal glucose: Secondary | ICD-10-CM | POA: Diagnosis not present

## 2021-08-11 DIAGNOSIS — N920 Excessive and frequent menstruation with regular cycle: Secondary | ICD-10-CM | POA: Diagnosis not present

## 2021-08-11 DIAGNOSIS — D259 Leiomyoma of uterus, unspecified: Secondary | ICD-10-CM | POA: Diagnosis not present

## 2021-08-19 ENCOUNTER — Ambulatory Visit (HOSPITAL_BASED_OUTPATIENT_CLINIC_OR_DEPARTMENT_OTHER): Payer: Medicaid Other | Admitting: Family

## 2021-08-19 NOTE — Progress Notes (Deleted)
Office Visit    Patient Name: Nolie Bignell Date of Encounter: 08/19/2021  PCP:  Gerald Leitz   [place]  Cardiologist:  [name], MD  Advanced Practice Provider:  No care team member to display Electrophysiologist:  None      Chief Complaint    Bryna Charma Mocarski is a 41 y.o. female presents today for chest pain   Past Medical History    Past Medical History:  Diagnosis Date   Anemia    DVT (deep venous thrombosis) ([place])    a. 11/2015 U/S: Left Peroneal DVT;  b. 02/2016 U/S: No DVT bilat.   Essential hypertension    a. 01/2016 Echo: EF 60-65%, no rwma, triv MR/TR, PASP 79mHg.   Fibroid    [place]    a. 11/2015 CTA Chest: PE involving the segmental and subsegmental branches of the RML & RLL-->Xarelto.   Past Surgical History:  Procedure Laterality Date   CESAREAN SECTION N/A 04/04/2013   Procedure: CESAREAN SECTION;  Surgeon: MEmily Filbert MD;  [facility];  Service: Obstetrics;  Laterality: N/A;   CESAREAN SECTION N/A 11/25/2015   Procedure: CESAREAN SECTION;  Surgeon: AEverett Graff MD;  [facility] [facility]  Service: Obstetrics;  Laterality: N/A;   [place]  [place]  [place]      Allergies  No Known Allergies  History of Present Illness    MPeonyAZariya Minneris a 41y.o. female with a hx of hypertension, palpitations last seen 12/23/2020  She had a pulmonary embolism in October 2017 shortly after her C-section.  There was note of DVT on lower extremity venous Dopplers.  She was treated with Xarelto.  Echo December 2017 with normal LVEF.  Stress echo January 2019 normal.  Monitor May 2021 with transient Mobitz 1 second-degree AV block and early morning hours.  Cardiac CTA September 2021 with coronary calcium score of 0.  CTA February 2022 with no pulmonary embolus.   When seen 05/19/2020 she noted pain in  the left lower rib area for 5 days continuously worse with inspiration.  It was tender on palpation and deemed musculoskeletal.  She was seen 12/23/2020 with persistent left-sided chest pain under her left breast around chest wall.  It was atypical for cardiac pain.  Echocardiogram updated 03/16/2021 normal LVEF, no significant valvular abnormalities.  She called the office 08/05/2021 noting shooting chest pain of the left chest 5 out of 10 not associated shortness of breath, cough, heaviness, dyspnea.  ***  Presents today for follow-up. 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 It is persistent on the left side of her body which she describes as stiffness. It is predominantly under her left breast and around her chest wall. She had nerve test, MRI, cardiac CT with no acute findings. It is occurring daily and lasting longer. She has a CT chest upcoming Friday. Notes she feels short of breath with even speaking long periods of time. Notes dyspnea with exertion with activities that she used to be able to complete without difficulty.   ***  EKGs/Labs/Other Studies Reviewed:   The following studies were reviewed today:  Echo 03/16/21   1. Left ventricular ejection fraction by 3D volume is 66 %. The left  ventricle has normal function.  The left ventricle has no regional wall  motion abnormalities. Left ventricular diastolic parameters were normal.  The average left ventricular global  longitudinal strain is -20.3 %. The global longitudinal strain is normal.   2. Right ventricular systolic function is normal. The right ventricular  size is mildly enlarged.   3. Left atrial size was mildly dilated.   4. The mitral valve is normal in structure. No evidence of mitral valve  regurgitation. No evidence of mitral stenosis.   5. The aortic valve is normal  in structure. Aortic valve regurgitation is  not visualized. No aortic stenosis is present.   6. The inferior vena cava is normal in size with greater than 50%  respiratory variability, suggesting right atrial pressure of 3 mmHg.   Comparison(s): EF 60%, mild MV leaflet thickening.   Cardiac CTA 11/22/19 FINDINGS: Non-cardiac: See separate report from [facility].   Pulmonary veins drain normally to the left atrium.   No thrombus noted in LA appendage.   Calcium Score: 0 Agatston units.   Coronary Arteries: Right dominant with no anomalies   LM: No plaque or stenosis.   LAD system:  No plaque or stenosis.   Circumflex system: No plaque or stenosis.   RCA system:  No plaque or stenosis.   IMPRESSION: 1. Coronary artery calcium score 0 Agatston units, suggesting low risk for future cardiac events.   2.  No significant coronary disease noted.  EKG:  EKG is ordered today.  The ekg ordered today demonstrates ***  Recent Labs: 12/23/2020: Hemoglobin 8.0; Platelets 471  Recent Lipid Panel No results found for: "CHOL", "TRIG", "HDL", "CHOLHDL", "VLDL", "LDLCALC", "LDLDIRECT"   Home Medications   No outpatient medications have been marked as taking for the 08/19/21 encounter (Appointment) with Loel Dubonnet, NP.     Review of Systems   All other systems reviewed and are otherwise negative except as noted above.  Physical Exam    VS:  There were no vitals taken for this visit. , BMI There is no height or weight on file to calculate BMI.  Wt Readings from Last 3 Encounters:  12/23/20 221 lb 1.6 oz (100.3 kg)  05/19/20 223 lb (101.2 kg)  10/07/19 233 lb (105.7 kg)     GEN: Well nourished, well developed, in no acute distress. HEENT: normal. Neck: Supple, no JVD, carotid bruits, or masses. Cardiac: ***RRR, no murmurs, rubs, or gallops. No clubbing, cyanosis, edema.  ***Radials/PT 2+ and equal bilaterally.  Respiratory:  ***Respirations regular and  unlabored, clear to auscultation bilaterally. GI: Soft, nontender, nondistended. MS: No deformity or atrophy. Skin: Warm and dry, no rash. Neuro:  Strength and sensation are intact. Psych: Normal affect.  Assessment & Plan    Chest pain -cardiac CTA 10/2019 coronary calcium score 0.  Echo 02/2021 normal LVEF, no significant valvular abnormalities. Palpitations -  HTN -  Anemia -   Disposition: Follow up {follow up:15908} with [name], MD or APP.  Signed, Loel Dubonnet, NP 08/19/2021, 1:14 PM [facility]

## 2021-08-27 ENCOUNTER — Other Ambulatory Visit: Payer: Self-pay | Admitting: Obstetrics and Gynecology

## 2021-09-03 NOTE — Progress Notes (Addendum)
Per pt she will be rescheduling her procedure schedule on 09-07-2021 for Dr Mancel Bale due to her mother be sick and in the hospital ICU.  Pt stated she called and left message at Dr Mancel Bale today. Sent inbox message to OR scheduler, Janit Pagan in epic.

## 2021-09-07 ENCOUNTER — Ambulatory Visit (HOSPITAL_BASED_OUTPATIENT_CLINIC_OR_DEPARTMENT_OTHER)
Admission: RE | Admit: 2021-09-07 | Payer: Medicaid Other | Source: Home / Self Care | Admitting: Obstetrics and Gynecology

## 2021-09-07 SURGERY — DILATATION & CURETTAGE/HYSTEROSCOPY WITH NOVASURE ABLATION
Anesthesia: Choice

## 2021-09-10 ENCOUNTER — Ambulatory Visit (HOSPITAL_BASED_OUTPATIENT_CLINIC_OR_DEPARTMENT_OTHER): Payer: Medicaid Other | Admitting: Family

## 2021-09-12 NOTE — Progress Notes (Unsigned)
Office Visit    Patient Name: Brittney Sandoval Date of Encounter: 09/13/2021  PCP:  Gerald Leitz   [facility]  Cardiologist:  [name], MD  Advanced Practice Provider:  No care team member to display Electrophysiologist:  None      Chief Complaint    Brittney Sandoval is a 41 y.o. female presents today for chest pain   Past Medical History    Past Medical History:  Diagnosis Date   Anemia    DVT (deep venous thrombosis) ([place])    a. 11/2015 U/S: Left Peroneal DVT;  b. 02/2016 U/S: No DVT bilat.   Essential hypertension    a. 01/2016 Echo: EF 60-65%, no rwma, triv MR/TR, PASP 49mHg.   Fibroid    Pulmonary emboli ([place]    a. 11/2015 CTA Chest: PE involving the segmental and subsegmental branches of the RML & RLL-->Xarelto.   Past Surgical History:  Procedure Laterality Date   CESAREAN SECTION N/A 04/04/2013   Procedure: CESAREAN SECTION;  Surgeon: MEmily Filbert MD;  [facility];  Service: Obstetrics;  Laterality: N/A;   CESAREAN SECTION N/A 11/25/2015   Procedure: CESAREAN SECTION;  Surgeon: AEverett Graff MD;  [facility]  Service: Obstetrics;  Laterality: N/A;   LAPAROSCOPIC CHOLECYSTECTOMY  2010   [facility]  [place]      Allergies  No Known Allergies  History of Present Illness    Brittney Sandoval a 41y.o. female with a hx of hypertension, palpitations last seen 12/23/2020  She had a pulmonary embolism in October 2017 shortly after her C-section.  There was note of DVT on lower extremity venous Dopplers.  She was treated with Xarelto.  Echo December 2017 with normal LVEF.  Stress echo January 2019 normal.  Monitor May 2021 with transient Mobitz 1 second-degree AV block and early morning hours.  Cardiac CTA September 2021 with coronary calcium score of 0.  CTA February 2022 with no pulmonary embolus.   When seen 05/19/2020 she noted pain in  the left lower rib area for 5 days continuously worse with inspiration.  It was tender on palpation and deemed musculoskeletal.  She was seen 12/23/2020 with persistent left-sided chest pain under her left breast around chest wall.  It was atypical for cardiac pain.  Echocardiogram updated 03/16/2021 normal LVEF, no significant valvular abnormalities.  She called the office 08/05/2021 noting shooting chest pain of the left chest 5 out of 10 not associated shortness of breath, cough, heaviness, dyspnea.  She presents today for follow up independently.  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   Notes ongoing chest pain for 1-2 years. Feels similar to previous. Tells me it comes and goes and has not had any in the last few weeks. The pain is always under her left breast around chest wall.  However, she is more conscientious as her mother recently had CABG X3.  She wonders whether she needs a cardiac catheterization.  We reviewed her most recent coronary CTA 10/2019. Has been walking 2 miles 4 days per week for the past couple weeks without significant chest pain, dyspnea. EKGs/Labs/Other Studies Reviewed:   The following studies were reviewed today:  Echo 03/16/21   1. Left ventricular ejection fraction by 3D volume is 66 %. The left  ventricle has  normal function. The left ventricle has no regional wall  motion abnormalities. Left ventricular diastolic parameters were normal.  The average left ventricular global  longitudinal strain is -20.3 %. The global longitudinal strain is normal.   2. Right ventricular systolic function is normal. The right ventricular  size is mildly enlarged.   3. Left atrial size was mildly dilated.   4. The mitral valve is normal in structure. No evidence of mitral valve  regurgitation. No evidence of mitral stenosis.   5.  The aortic valve is normal in structure. Aortic valve regurgitation is  not visualized. No aortic stenosis is present.   6. The inferior vena cava is normal in size with greater than 50%  respiratory variability, suggesting right atrial pressure of 3 mmHg.   Comparison(s): EF 60%, mild MV leaflet thickening.   Cardiac CTA 11/22/19 FINDINGS: Non-cardiac: See separate report from [facility].   Pulmonary veins drain normally to the left atrium.   No thrombus noted in LA appendage.   Calcium Score: 0 Agatston units.   Coronary Arteries: Right dominant with no anomalies   LM: No plaque or stenosis.   LAD system:  No plaque or stenosis.   Circumflex system: No plaque or stenosis.   RCA system:  No plaque or stenosis.   IMPRESSION: 1. Coronary artery calcium score 0 Agatston units, suggesting low risk for future cardiac events.   2.  No significant coronary disease noted.  EKG:  EKG is ordered today.  The ekg ordered today demonstrates NSR 83 bpm with no acute ST/T wave changes.   Recent Labs: 12/23/2020: Hemoglobin 8.0; Platelets 471  Recent Lipid Panel No results found for: "CHOL", "TRIG", "HDL", "CHOLHDL", "VLDL", "LDLCALC", "LDLDIRECT"   Home Medications   Current Meds  Medication Sig   amLODipine (NORVASC) 10 MG tablet TAKE 1 TABLET BY MOUTH DAILY (Patient taking differently: Take 10 mg by mouth daily.)   Cholecalciferol 25 MCG (1000 UT) tablet Take 2,000 Units by mouth daily.   ferrous sulfate 325 (65 FE) MG tablet Take 1 tablet by mouth 2 (two) times daily.   hydrochlorothiazide (HYDRODIURIL) 12.5 MG tablet Take 12.5 mg by mouth daily.   Multiple Vitamins-Minerals (MULTIVITAL PO) Take 1 tablet by mouth daily.   triamcinolone ointment (KENALOG) 0.1 % Apply 1 application  topically as needed (break outs).     Review of Systems   All other systems reviewed and are otherwise negative except as noted above.  Physical Exam    VS:  BP 118/78   Pulse 83    Ht '5\' 4"'$  (1.626 m)   Wt 204 lb (92.5 kg)   BMI 35.02 kg/m  , BMI Body mass index is 35.02 kg/m.  Wt Readings from Last 3 Encounters:  09/13/21 204 lb (92.5 kg)  12/23/20 221 lb 1.6 oz (100.3 kg)  05/19/20 223 lb (101.2 kg)     GEN: Well nourished, well developed, in no acute distress. HEENT: normal. Neck: Supple, no JVD, carotid bruits, or masses. Cardiac: RRR, no murmurs, rubs, or gallops. No clubbing, cyanosis, edema.  Radials/PT 2+ and equal bilaterally.  Respiratory:  Respirations regular and unlabored, clear to auscultation bilaterally. GI: Soft, nontender, nondistended. MS: No deformity or atrophy. Skin: Warm and dry, no rash. Neuro:  Strength and sensation are intact. Psych: Normal affect.  Assessment & Plan    Chest pain -cardiac CTA 10/2019 coronary calcium score 0.  Echo 02/2021 normal LVEF, no significant valvular abnormalities.  Still with ongoing chest discomfort which is  overall atypical for angina.  She is understandably worried as her mother recently had a CABG X3.  We discussed watchful waiting versus pursuing additional ischemic evaluation.  She prefers to proceed with cardiac CTA.  Palpitations -intermittent palpitations.  Encouraged to stay well-hydrated, manage stressors well, with caffeine.  Overall not bothersome-we will defer medication therapy though beta-blocker could be considered in the future if needed.  Update BMP, magnesium, thyroid panel.  HTN - BP well controlled. Continue current antihypertensive regimen Amlodipine '10mg'$  QD, HCTZ 12.'5mg'$  QD.   GERD - Controlled on Prilosec.  Anemia - Most recent Hb 8 in May 2023 per her report. Has been taking her iron consistently. Update CBC.   Disposition: Follow up in 6 month(s) with [name], MD or APP.  Signed, Brittney Dubonnet, NP 09/13/2021, 8:49 AM [place]

## 2021-09-13 ENCOUNTER — Ambulatory Visit (HOSPITAL_BASED_OUTPATIENT_CLINIC_OR_DEPARTMENT_OTHER): Payer: Medicaid Other | Admitting: Family

## 2021-09-13 ENCOUNTER — Encounter (HOSPITAL_BASED_OUTPATIENT_CLINIC_OR_DEPARTMENT_OTHER): Payer: Self-pay | Admitting: Family

## 2021-09-13 VITALS — BP 118/78 | HR 83 | Ht 64.0 in | Wt 204.0 lb

## 2021-09-13 DIAGNOSIS — R002 Palpitations: Secondary | ICD-10-CM | POA: Diagnosis not present

## 2021-09-13 DIAGNOSIS — I10 Essential (primary) hypertension: Secondary | ICD-10-CM

## 2021-09-13 DIAGNOSIS — D649 Anemia, unspecified: Secondary | ICD-10-CM

## 2021-09-13 DIAGNOSIS — R071 Chest pain on breathing: Secondary | ICD-10-CM | POA: Diagnosis not present

## 2021-09-13 DIAGNOSIS — E559 Vitamin D deficiency, unspecified: Secondary | ICD-10-CM

## 2021-09-13 DIAGNOSIS — R079 Chest pain, unspecified: Secondary | ICD-10-CM

## 2021-09-13 MED ORDER — METOPROLOL TARTRATE 100 MG PO TABS: ORAL_TABLET | ORAL | 0 refills | Status: DC

## 2021-09-13 NOTE — Patient Instructions (Addendum)
Medication Instructions:  Continue your current medications.   *If you need a refill on your cardiac medications before your next appointment, please call your pharmacy*  Lab Work: Your physician recommends that you return for lab work today: BMP, magnesium, thyroid panel, CBC  If you have labs (blood work) drawn today and your tests are completely normal, you will receive your results only by: MyChart Message (if you have MyChart) OR A paper copy in the mail If you have any lab test that is abnormal or we need to change your treatment, we will call you to review the results.  Testing/Procedures: Your EKG today showed normal sinus rhythm which is a great result!  Your physician has requested that you have cardiac CT. Cardiac computed tomography (CT) is a painless test that uses an x-ray machine to take clear, detailed pictures of your heart. Please follow instruction sheet as given.  Follow-Up: At [facility], you and your health needs are our priority.  As part of our continuing mission to provide you with exceptional heart care, we have created designated Provider Care Teams.  These Care Teams include your primary Cardiologist (physician) and Advanced Practice Providers (APPs -  Physician Assistants and Nurse Practitioners) who all work together to provide you with the care you need, when you need it.  We recommend signing up for the patient portal called "MyChart".  Sign up information is provided on this After Visit Summary.  MyChart is used to connect with patients for Virtual Visits (Telemedicine).  Patients are able to view lab/test results, encounter notes, upcoming appointments, etc.  Non-urgent messages can be sent to your provider as well.   To learn more about what you can do with MyChart, go to [email or web address].    Your next appointment:   6 month(s)  The format for your next appointment:   In Person  Provider:   [name], MD or Loel Dubonnet, NP      Other Instructions  Heart Healthy Diet Recommendations: A low-salt diet is recommended. Meats should be grilled, baked, or boiled. Avoid fried foods. Focus on lean protein sources like fish or chicken with vegetables and fruits. The American Heart Association is a Microbiologist!  American Heart Association Diet and Lifeystyle Recommendations   Exercise recommendations: The American Heart Association recommends 150 minutes of moderate intensity exercise weekly. Try 30 minutes of moderate intensity exercise 4-5 times per week. This could include walking, jogging, or swimming.  To prevent palpitations: Make sure you are adequately hydrated.  Avoid and/or limit caffeine containing beverages like soda or tea. Exercise regularly.  Manage stress well. Some over the counter medications can cause palpitations such as Benadryl, AdvilPM, TylenolPM. Regular Advil or Tylenol do not cause palpitations.      Your cardiac CT will be scheduled at one of the below locations:   [facility] [address] [place], [place] [address] [phone]  If scheduled at [facility], please arrive at the [facility] and Children's Entrance (Entrance C2) of [facility] 30 minutes prior to test start time. You can use the FREE valet parking offered at entrance C (encouraged to control the heart rate for the test)  Proceed to the [facility] Radiology Department (first floor) to check-in and test prep.  All radiology patients and guests should use entrance C2 at [facility], accessed from [facility], even though the hospital's physical address listed is [address]      Please  follow these instructions carefully (unless otherwise directed):  On the Night Before the Test: Be sure to Drink plenty of water. Do not consume any caffeinated/decaffeinated beverages or chocolate 12 hours prior to your test. Do not take any antihistamines 12 hours prior  to your test.  On the Day of the Test: Drink plenty of water until 1 hour prior to the test. Do not eat any food 4 hours prior to the test. You may take your regular medications prior to the test.  Take metoprolol (Lopressor) two hours prior to test. HOLD Hydrochlorothiazide morning of the test. FEMALES- please wear underwire-free bra if available, avoid dresses & tight clothing      After the Test: Drink plenty of water. After receiving IV contrast, you may experience a mild flushed feeling. This is normal. On occasion, you may experience a mild rash up to 24 hours after the test. This is not dangerous. If this occurs, you can take Benadryl 25 mg and increase your fluid intake. If you experience trouble breathing, this can be serious. If it is severe call 911 IMMEDIATELY. If it is mild, please call our office. If you take any of these medications: Glipizide/Metformin, Avandament, Glucavance, please do not take 48 hours after completing test unless otherwise instructed.  We will call to schedule your test 2-4 weeks out understanding that some insurance companies will need an authorization prior to the service being performed.   For non-scheduling related questions, please contact the cardiac imaging nurse navigator should you have any questions/concerns: Marchia Bond, Cardiac Imaging Nurse Navigator Gordy Clement, Cardiac Imaging Nurse Navigator [facility] Direct Office Dial: [phone]   For scheduling needs, including cancellations and rescheduling, please call [place], [phone].

## 2021-09-14 ENCOUNTER — Telehealth: Payer: Self-pay | Admitting: Hematology

## 2021-09-14 ENCOUNTER — Telehealth (HOSPITAL_BASED_OUTPATIENT_CLINIC_OR_DEPARTMENT_OTHER): Payer: Self-pay

## 2021-09-14 DIAGNOSIS — D649 Anemia, unspecified: Secondary | ICD-10-CM

## 2021-09-14 LAB — CBC
Hematocrit: 28.7 % — ABNORMAL LOW (ref 34.0–46.6)
Hemoglobin: 7.7 g/dL — ABNORMAL LOW (ref 11.1–15.9)
MCH: 16.2 pg — ABNORMAL LOW (ref 26.6–33.0)
MCHC: 26.8 g/dL — ABNORMAL LOW (ref 31.5–35.7)
MCV: 60 fL — ABNORMAL LOW (ref 79–97)
Platelets: 420 10*3/uL (ref 150–450)
RBC: 4.75 x10E6/uL (ref 3.77–5.28)
RDW: 22.6 % — ABNORMAL HIGH (ref 11.7–15.4)
WBC: 6.1 10*3/uL (ref 3.4–10.8)

## 2021-09-14 LAB — THYROID PANEL WITH TSH
Free Thyroxine Index: 4 (ref 1.2–4.9)
T3 Uptake Ratio: 32 % (ref 24–39)
T4, Total: 12.5 ug/dL — ABNORMAL HIGH (ref 4.5–12.0)
TSH: 0.947 u[IU]/mL (ref 0.450–4.500)

## 2021-09-14 LAB — BASIC METABOLIC PANEL
BUN/Creatinine Ratio: 14 (ref 9–23)
BUN: 11 mg/dL (ref 6–24)
CO2: 20 mmol/L (ref 20–29)
Calcium: 9.7 mg/dL (ref 8.7–10.2)
Chloride: 106 mmol/L (ref 96–106)
Creatinine, Ser: 0.77 mg/dL (ref 0.57–1.00)
Glucose: 107 mg/dL — ABNORMAL HIGH (ref 70–99)
Potassium: 4.3 mmol/L (ref 3.5–5.2)
Sodium: 142 mmol/L (ref 134–144)
eGFR: 99 mL/min/{1.73_m2} (ref >59)

## 2021-09-14 LAB — MAGNESIUM: Magnesium: 2.1 mg/dL (ref 1.6–2.3)

## 2021-09-14 NOTE — Telephone Encounter (Addendum)
Results called to patient who verbalizes understanding! Orders placed urgent as request of NP     ----- Message from Loel Dubonnet, NP sent at 09/14/2021  7:55 AM EDT ----- Normal kidney function, electrolytes. Thyroid hormone normal, T4 mildly elevated but not of concern. CBC with no infection. However, CBC shows worsening anemia. Anticipate this is the cause of shortness of breath.   Recommend she call her PCP today to make them aware.  Refer to hematology for anemia.

## 2021-09-14 NOTE — Telephone Encounter (Signed)
Scheduled appt per 7/18 referral. Pt is aware of appt date and time. Pt is aware to arrive 15 mins prior to appt time and to bring and updated insurance card. Pt is aware of appt location.   

## 2021-09-21 DIAGNOSIS — R1900 Intra-abdominal and pelvic swelling, mass and lump, unspecified site: Secondary | ICD-10-CM | POA: Diagnosis not present

## 2021-09-21 DIAGNOSIS — N920 Excessive and frequent menstruation with regular cycle: Secondary | ICD-10-CM | POA: Diagnosis not present

## 2021-09-21 DIAGNOSIS — D649 Anemia, unspecified: Secondary | ICD-10-CM | POA: Diagnosis not present

## 2021-10-04 DIAGNOSIS — H109 Unspecified conjunctivitis: Secondary | ICD-10-CM | POA: Diagnosis not present

## 2021-10-07 ENCOUNTER — Other Ambulatory Visit: Payer: Self-pay | Admitting: Obstetrics and Gynecology

## 2021-10-12 ENCOUNTER — Inpatient Hospital Stay: Payer: Medicaid Other | Attending: Hematology | Admitting: Hematology

## 2021-10-12 ENCOUNTER — Inpatient Hospital Stay: Payer: Medicaid Other

## 2021-10-12 ENCOUNTER — Telehealth: Payer: Self-pay | Admitting: Internal Medicine

## 2021-10-12 NOTE — Telephone Encounter (Signed)
R/s pt's new hem appt per pt request. Pt is aware of new appt date/time.  

## 2021-10-15 ENCOUNTER — Telehealth (HOSPITAL_COMMUNITY): Payer: Self-pay | Admitting: *Deleted

## 2021-10-15 NOTE — Telephone Encounter (Signed)
Reaching out to patient to offer assistance regarding upcoming cardiac imaging study; pt verbalizes understanding of appt date/time, parking situation and where to check in, pre-test NPO status and medications ordered, and verified current allergies; name and call back number provided for further questions should they arise  Leslyn Monda RN Navigator Cardiac Imaging [facility] [phone] office [phone] cell  Patient to take 100mg metoprolol tartrate two hours prior to her cardiac CT scan. She is aware to arrive at 11:30 am. 

## 2021-10-18 ENCOUNTER — Ambulatory Visit (HOSPITAL_COMMUNITY): Admission: RE | Admit: 2021-10-18 | Payer: Medicaid Other | Source: Ambulatory Visit

## 2021-10-22 ENCOUNTER — Telehealth: Payer: Self-pay | Admitting: Cardiology

## 2021-10-22 ENCOUNTER — Telehealth: Payer: Self-pay | Admitting: *Deleted

## 2021-10-22 NOTE — Telephone Encounter (Signed)
Pt agreeable to televisit pre op appt 11/03/21 @ 10 am. Med rec and consent are done.

## 2021-10-22 NOTE — Telephone Encounter (Signed)
Pt agreeable to televisit pre op appt 11/03/21 @ 10 am. Med rec and consent are done.     Patient Consent for Virtual Visit        Brittney Sandoval has provided verbal consent on 10/22/2021 for a virtual visit (video or telephone).   CONSENT FOR VIRTUAL VISIT FOR:  Brittney Sandoval  By participating in this virtual visit I agree to the following:  I hereby voluntarily request, consent and authorize [facility] and its employed or contracted physicians, physician assistants, nurse practitioners or other licensed health care professionals (the Practitioner), to provide me with telemedicine health care services (the "Services") as deemed necessary by the treating Practitioner. I acknowledge and consent to receive the Services by the Practitioner via telemedicine. I understand that the telemedicine visit will involve communicating with the Practitioner through live audiovisual communication technology and the disclosure of certain medical information by electronic transmission. I acknowledge that I have been given the opportunity to request an in-person assessment or other available alternative prior to the telemedicine visit and am voluntarily participating in the telemedicine visit.  I understand that I have the right to withhold or withdraw my consent to the use of telemedicine in the course of my care at any time, without affecting my right to future care or treatment, and that the Practitioner or I may terminate the telemedicine visit at any time. I understand that I have the right to inspect all information obtained and/or recorded in the course of the telemedicine visit and may receive copies of available information for a reasonable fee.  I understand that some of the potential risks of receiving the Services via telemedicine include:  Delay or interruption in medical evaluation due to technological equipment failure or disruption; Information transmitted may not be  sufficient (e.g. poor resolution of images) to allow for appropriate medical decision making by the Practitioner; and/or  In rare instances, security protocols could fail, causing a breach of personal health information.  Furthermore, I acknowledge that it is my responsibility to provide information about my medical history, conditions and care that is complete and accurate to the best of my ability. I acknowledge that Practitioner's advice, recommendations, and/or decision may be based on factors not within their control, such as incomplete or inaccurate data provided by me or distortions of diagnostic images or specimens that may result from electronic transmissions. I understand that the practice of medicine is not an exact science and that Practitioner makes no warranties or guarantees regarding treatment outcomes. I acknowledge that a copy of this consent can be made available to me via my patient portal ([place]), or I can request a printed copy by calling the office of [facility].    I understand that my insurance will be billed for this visit.   I have read or had this consent read to me. I understand the contents of this consent, which adequately explains the benefits and risks of the Services being provided via telemedicine.  I have been provided ample opportunity to ask questions regarding this consent and the Services and have had my questions answered to my satisfaction. I give my informed consent for the services to be provided through the use of telemedicine in my medical care

## 2021-10-22 NOTE — Telephone Encounter (Signed)
   Pre-operative Risk Assessment    Patient Name: Brittney Sandoval  DOB: [birth year] MRN: [id number]      Request for Surgical Clearance    Procedure:   [facility] Hysteroscopy with endometrial ablation  Date of Surgery:  Clearance 11/22/21                                 Surgeon:  Dr. Everett Graff Surgeon's Group or Practice Name:  [facility] Phone number:  [phone] Fax number: [phone]    Type of Clearance Requested:   - Medical    Type of Anesthesia:   Choice    Additional requests/questions:    Signed, April Henson   10/22/2021, 2:44 PM

## 2021-10-22 NOTE — Telephone Encounter (Signed)
   Name: Jaquila Santelli  DOB: [birth year]  MRN: [id number]  Primary Cardiologist: [name], MD  Chart reviewed as part of pre-operative protocol coverage. Because of Navika Hoopes Brooker's past medical history and time since last visit, she will require a follow-up telephone visit in order to better assess preoperative cardiovascular risk.  No medications listed to be held.  Pre-op covering staff: - Please schedule appointment and call patient to inform them. If patient already had an upcoming appointment within acceptable timeframe, please add "pre-op clearance" to the appointment notes so provider is aware. - Please contact requesting surgeon's office via preferred method (i.e, phone, fax) to inform them of need for appointment prior to surgery.    Elgie Collard, PA-C  10/22/2021, 3:53 PM

## 2021-10-22 NOTE — Telephone Encounter (Signed)
Chest pain at that time was atypical. Prior coronary calcium score of 0. So long as she is not having new or worsening symptoms and completing >4METS would be reasonable to proceed with procedure. She likely needs a phone call or virtual visit to discuss.  Loel Dubonnet, NP

## 2021-11-03 ENCOUNTER — Encounter: Payer: Self-pay | Admitting: Nurse Practitioner

## 2021-11-03 ENCOUNTER — Ambulatory Visit: Payer: Medicaid Other | Attending: Cardiology | Admitting: Nurse Practitioner

## 2021-11-03 DIAGNOSIS — Z01810 Encounter for preprocedural cardiovascular examination: Secondary | ICD-10-CM

## 2021-11-03 NOTE — Progress Notes (Signed)
Virtual Visit via Telephone Note   Because of Brittney Sandoval's co-morbid illnesses, she is at least at moderate risk for complications without adequate follow up.  This format is felt to be most appropriate for this patient at this time.  The patient did not have access to video technology/had technical difficulties with video requiring transitioning to audio format only (telephone).  All issues noted in this document were discussed and addressed.  No physical exam could be performed with this format.  Please refer to the patient's chart for her consent to telehealth for [facility].  Evaluation Performed:  Preoperative cardiovascular risk assessment _____________   Date:  11/03/2021   Patient ID:  Brittney Sandoval, DOB 02-24-1981, MRN [id number] Patient Location:  Home Provider location:   Office  Primary Care Provider:  Gerald Leitz Primary Cardiologist:  [name], MD  Chief Complaint / Patient Profile   41 y.o. y/o female with a h/o PE in October 2017 shortly after C-section, DVT noted on lower extremity venous dopplers treated with Xarelto, hypertension, coronary calcium score of zero, chest pain, palpitations, and anemia who is pending D & C hysteroscopy with endometrial ablation and presents today for telephonic preoperative cardiovascular risk assessment.  Past Medical History    Past Medical History:  Diagnosis Date   Anemia    DVT (deep venous thrombosis) ([place])    a. 11/2015 U/S: Left Peroneal DVT;  b. 02/2016 U/S: No DVT bilat.   Essential hypertension    a. 01/2016 Echo: EF 60-65%, no rwma, triv MR/TR, PASP 79mHg.   Fibroid    Pulmonary emboli ([place]    a. 11/2015 CTA Chest: PE involving the segmental and subsegmental branches of the RML & RLL-->Xarelto.   Past Surgical History:  Procedure Laterality Date   CESAREAN SECTION N/A 04/04/2013   Procedure: CESAREAN SECTION;  Surgeon: MEmily Filbert MD;  [place] [facility];   Service: Obstetrics;  Laterality: N/A;   CESAREAN SECTION N/A 11/25/2015   Procedure: CESAREAN SECTION;  Surgeon: AEverett Graff MD;  [place] [facility]  Service: Obstetrics;  Laterality: N/A;   [place]      Allergies  No Known Allergies  History of Present Illness    Brittney ACarianne Tairais a 41y.o. female who presents via audio/video conferencing for a telehealth visit today.  Pt was last seen in cardiology clinic on 09/13/21 by CLaurann Montana NP.  [facility] but continued to have chest pain x 1 year. She elected to proceed with repeating the CCTA. The patient is now pending procedure as outlined above. Since her last visit, she denies fatigue, palpitations, melena, hematuria, hemoptysis, diaphoresis, weakness, presyncope, syncope, orthopnea, and PND. Reports leg swelling at the end of a work day that resolves overnight. Continues to have chest pain in her left chest that has been pretty constant since 2017. Pain comes and goes, described as "irritable" and not associated with SOB, n/v, diaphoresis. Pain always occurs in left chest - had pain prior to COVID. Has bilateral leg pain with walking. Continues to walk 2 miles approximately 4 days per week without worsening symptoms with the exception of more leg pain when going up inclines.  She underwent colonoscopy without any concerning symptoms or complications.   Home Medications    Prior to Admission medications   Medication Sig Start Date End Date Taking? Authorizing Provider  amLODipine (NORVASC) 10 MG tablet TAKE 1 TABLET BY MOUTH DAILY Patient taking differently: Take 10 mg by mouth daily. 03/27/20   Lelon Perla, MD  Cholecalciferol 25 MCG (1000 UT) tablet Take 2,000 Units by mouth daily.    [provider]  ferrous sulfate 325 (65 FE) MG tablet Take 1 tablet by mouth 2 (two) times  daily. 10/28/15   [provider]  hydrochlorothiazide (HYDRODIURIL) 12.5 MG tablet Take 12.5 mg by mouth daily. 02/26/20   [provider]  metoprolol tartrate (LOPRESSOR) 100 MG tablet Take one tablet two hours prior to your cardiac CTA. 09/13/21   Loel Dubonnet, NP  Multiple Vitamins-Minerals (MULTIVITAL PO) Take 1 tablet by mouth daily.    [provider]  omeprazole (PRILOSEC) 20 MG capsule Take 1 capsule (20 mg total) by mouth daily. 04/08/20 05/08/20  Marcello Fennel, PA-C  triamcinolone ointment (KENALOG) 0.1 % Apply 1 application  topically as needed (break outs). 09/20/19   [provider]    Physical Exam    Vital Signs:  Brittney Sandoval does not have vital signs available for review today.  Given telephonic nature of communication, physical exam is limited. AAOx3. NAD. Normal affect.  Speech and respirations are unlabored.  Accessory Clinical Findings    None  Assessment & Plan    1.  Preoperative Cardiovascular Risk Assessment: She is stable from a cardiac perspective and may proceed to surgery without further cardiac testing. According to the Revised Cardiac Risk Index (RCRI), her Perioperative Risk of Major Cardiac Event is (%): 0.9. Her Functional Capacity in METs is: 6.05 according to the [facility] Activity Status Index (DASI).  No request to hold medications  A copy of this note will be routed to requesting surgeon.  Time:   Today, I have spent 10 minutes with the patient with telehealth technology discussing medical history, symptoms, and management plan.     Emmaline Life, NP-C     11/03/2021, 6:39 AM [address] Office [phone] Fax [phone]

## 2021-11-04 ENCOUNTER — Encounter (HOSPITAL_BASED_OUTPATIENT_CLINIC_OR_DEPARTMENT_OTHER): Payer: Self-pay | Admitting: Obstetrics and Gynecology

## 2021-11-04 NOTE — Progress Notes (Signed)
Spoke w/ via phone for pre-op interview--- [employer]----  ISTAT and UPT. Surgeon orders pending.             Lab results------ Current EKG dated 08/2021 in Epic. COVID test -----patient states asymptomatic no test needed Arrive at -------1030 NPO after MN NO Solid Food.  Clear liquids from MN until---0930 Med rec completed Medications to take morning of surgery ----- Norvasc Diabetic medication ----- Patient instructed no nail polish to be worn day of surgery Patient instructed to bring photo id and insurance card day of surgery Patient aware to have Driver (ride ) / caregiver  Mother Marlowe Kays or daughter Roni Bread   for 24 hours after surgery  Patient Special Instructions ----- Pre-Op special Istructions ----- Patient verbalized understanding of instructions that were given at this phone interview. Patient denies shortness of breath, chest pain, fever, cough at this phone interview.   Cardiac clearance in Epic dated 11/03/2021 by Christen Bame, NP. Cardiologist Dr. Stanford Breed.

## 2021-11-05 ENCOUNTER — Other Ambulatory Visit: Payer: Self-pay | Admitting: Obstetrics and Gynecology

## 2021-11-05 ENCOUNTER — Other Ambulatory Visit: Payer: Self-pay

## 2021-11-05 DIAGNOSIS — D649 Anemia, unspecified: Secondary | ICD-10-CM

## 2021-11-08 ENCOUNTER — Encounter: Payer: Self-pay | Admitting: Internal Medicine

## 2021-11-08 ENCOUNTER — Inpatient Hospital Stay: Payer: Medicaid Other

## 2021-11-08 ENCOUNTER — Other Ambulatory Visit: Payer: Self-pay

## 2021-11-08 ENCOUNTER — Inpatient Hospital Stay: Payer: Medicaid Other | Attending: Hematology | Admitting: Internal Medicine

## 2021-11-08 VITALS — BP 135/88 | HR 67 | Temp 97.8°F | Resp 15 | Wt 207.7 lb

## 2021-11-08 DIAGNOSIS — Z79899 Other long term (current) drug therapy: Secondary | ICD-10-CM | POA: Diagnosis not present

## 2021-11-08 DIAGNOSIS — N920 Excessive and frequent menstruation with regular cycle: Secondary | ICD-10-CM | POA: Diagnosis not present

## 2021-11-08 DIAGNOSIS — I10 Essential (primary) hypertension: Secondary | ICD-10-CM | POA: Insufficient documentation

## 2021-11-08 DIAGNOSIS — D500 Iron deficiency anemia secondary to blood loss (chronic): Secondary | ICD-10-CM | POA: Diagnosis not present

## 2021-11-08 DIAGNOSIS — Z86718 Personal history of other venous thrombosis and embolism: Secondary | ICD-10-CM | POA: Insufficient documentation

## 2021-11-08 DIAGNOSIS — D259 Leiomyoma of uterus, unspecified: Secondary | ICD-10-CM | POA: Insufficient documentation

## 2021-11-08 DIAGNOSIS — K219 Gastro-esophageal reflux disease without esophagitis: Secondary | ICD-10-CM | POA: Diagnosis not present

## 2021-11-08 DIAGNOSIS — D473 Essential (hemorrhagic) thrombocythemia: Secondary | ICD-10-CM

## 2021-11-08 DIAGNOSIS — Z86711 Personal history of pulmonary embolism: Secondary | ICD-10-CM | POA: Diagnosis not present

## 2021-11-08 DIAGNOSIS — D573 Sickle-cell trait: Secondary | ICD-10-CM | POA: Insufficient documentation

## 2021-11-08 DIAGNOSIS — D649 Anemia, unspecified: Secondary | ICD-10-CM

## 2021-11-08 LAB — CBC WITH DIFFERENTIAL (CANCER CENTER ONLY)
Abs Immature Granulocytes: 0.02 10*3/uL (ref 0.00–0.07)
Basophils Absolute: 0.1 10*3/uL (ref 0.0–0.1)
Basophils Relative: 1 %
Eosinophils Absolute: 0.1 10*3/uL (ref 0.0–0.5)
Eosinophils Relative: 2 %
HCT: 25.6 % — ABNORMAL LOW (ref 36.0–46.0)
Hemoglobin: 7.4 g/dL — ABNORMAL LOW (ref 12.0–15.0)
Immature Granulocytes: 0 %
Lymphocytes Relative: 35 %
Lymphs Abs: 1.9 10*3/uL (ref 0.7–4.0)
MCH: 16.3 pg — ABNORMAL LOW (ref 26.0–34.0)
MCHC: 28.9 g/dL — ABNORMAL LOW (ref 30.0–36.0)
MCV: 56.3 fL — ABNORMAL LOW (ref 80.0–100.0)
Monocytes Absolute: 0.4 10*3/uL (ref 0.1–1.0)
Monocytes Relative: 7 %
Neutro Abs: 2.9 10*3/uL (ref 1.7–7.7)
Neutrophils Relative %: 55 %
Platelet Count: 476 10*3/uL — ABNORMAL HIGH (ref 150–400)
RBC: 4.55 MIL/uL (ref 3.87–5.11)
RDW: 21.5 % — ABNORMAL HIGH (ref 11.5–15.5)
WBC Count: 5.4 10*3/uL (ref 4.0–10.5)
nRBC: 0 % (ref 0.0–0.2)

## 2021-11-08 LAB — CMP (CANCER CENTER ONLY)
ALT: 12 U/L (ref 0–44)
AST: 18 U/L (ref 15–41)
Albumin: 4.3 g/dL (ref 3.5–5.0)
Alkaline Phosphatase: 54 U/L (ref 38–126)
Anion gap: 4 — ABNORMAL LOW (ref 5–15)
BUN: 13 mg/dL (ref 6–20)
CO2: 28 mmol/L (ref 22–32)
Calcium: 9.7 mg/dL (ref 8.9–10.3)
Chloride: 105 mmol/L (ref 98–111)
Creatinine: 0.78 mg/dL (ref 0.44–1.00)
GFR, Estimated: >60 mL/min (ref >60)
Glucose, Bld: 98 mg/dL (ref 70–99)
Potassium: 3.8 mmol/L (ref 3.5–5.1)
Sodium: 137 mmol/L (ref 135–145)
Total Bilirubin: 0.4 mg/dL (ref 0.3–1.2)
Total Protein: 7.9 g/dL (ref 6.5–8.1)

## 2021-11-08 LAB — IRON AND IRON BINDING CAPACITY (CC-WL,HP ONLY)
Iron: 7 ug/dL — ABNORMAL LOW (ref 28–170)
Saturation Ratios: 1 % — ABNORMAL LOW (ref 10.4–31.8)
TIBC: 510 ug/dL — ABNORMAL HIGH (ref 250–450)
UIBC: 503 ug/dL — ABNORMAL HIGH (ref 148–442)

## 2021-11-08 LAB — FOLATE: Folate: 10.6 ng/mL (ref >5.9)

## 2021-11-08 LAB — VITAMIN B12: Vitamin B-12: 210 pg/mL (ref 180–914)

## 2021-11-08 LAB — FERRITIN: Ferritin: 3 ng/mL — ABNORMAL LOW (ref 11–307)

## 2021-11-08 NOTE — Telephone Encounter (Signed)
Pt was cleared by Christen Bame, NP 11/03/21. Ov notes were faxed 11/03/21 from NP giving clearance. I will re-fax notes today as well.

## 2021-11-08 NOTE — Progress Notes (Signed)
[facility] [phone]   [phone]  CONSULT NOTE  REFERRING PHYSICIAN: Park Meo, PA-C  REASON FOR CONSULTATION:  41 years old African-American female with microcytic anemia.  HPI Brittney Sandoval is a 41 y.o. female with past medical history significant for hypertension, GERD, uterine fibroid, deep venous thrombosis and pulmonary embolism in October 2017 postpartum, history of sickle cell trait and anemia that has been going on for several years.  The patient was seen recently by her gynecologist and she was found to have significant anemia.  She has been on treatment with over-the-counter oral ferrous sulfate 325 mg p.o. twice daily with no improvement in her anemia.  She had a colonoscopy last year that was unremarkable.  She was supposed to have EGD but this was not done.  She complains of heavy.  That lasted between 5-7 days with heavy clots that lasted for 2 days.  The patient also has history of uterine fibroids and polyps.  She is scheduled for ablation of her fibroid in few weeks.  She was referred to me today for evaluation and recommendation regarding her anemia. When seen today she continues to complain of fatigue and foggy brain as well as aching pain in the chest as well as leg pain and restless leg.  She has craving for ice and starch.  She has occasional shortness of breath and dizzy spells.  She gets upset stomach with the oral iron tablets. She has no current nausea, vomiting, diarrhea or constipation.  She has no cough or hemoptysis.  She has no headache or visual changes. Family history significant for mother with diabetes, kidney disease and hypertension.  Father had kidney disease and peripheral vascular disease. The patient is single and has 5 children.  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  She has no history for smoking but drinks alcohol occasionally and no history of drug abuse.  HPI  Past  Medical History:  Diagnosis Date   Anemia    DVT (deep venous thrombosis) ([place])    a. 11/2015 U/S: Left Peroneal DVT;  b. 02/2016 U/S: No DVT bilat.   Essential hypertension    a. 01/2016 Echo: EF 60-65%, no rwma, triv MR/TR, PASP 73mHg.   Fibroid    GERD (gastroesophageal reflux disease)    [place]    a. 11/2015 CTA Chest: PE involving the segmental and subsegmental branches of the RML & RLL-->Xarelto.    Past Surgical History:  Procedure Laterality Date   CESAREAN SECTION N/A 04/04/2013   Procedure: CESAREAN SECTION;  Surgeon: MEmily Filbert MD;  [facility];  Service: Obstetrics;  Laterality: N/A;   CESAREAN SECTION N/A 11/25/2015   Procedure: CESAREAN SECTION;  Surgeon: AEverett Graff MD;  [place]  Service: Obstetrics;  Laterality: N/A;   LAPAROSCOPIC [place]CYSTECTOMY  2010   [place]      Family History  Problem Relation Age of Onset   Diabetes Mother    Hypertension Mother    Kidney disease Mother    Heart disease Mother     Social History Social History   Tobacco Use   Smoking status: Never   Smokeless tobacco: Never  Vaping Use   Vaping Use: Never used  Substance Use Topics   Alcohol use: Yes    Comment: occassionally   Drug use: No    No Known Allergies  Current Outpatient Medications  Medication Sig Dispense  Refill   amLODipine (NORVASC) 10 MG tablet TAKE 1 TABLET BY MOUTH DAILY (Patient taking differently: Take 10 mg by mouth daily.) 90 tablet 3   Cholecalciferol 25 MCG (1000 UT) tablet Take 2,000 Units by mouth daily.     ferrous sulfate 325 (65 FE) MG tablet Take 1 tablet by mouth 2 (two) times daily.  1   hydrochlorothiazide (HYDRODIURIL) 12.5 MG tablet Take 12.5 mg by mouth daily.     metoprolol tartrate (LOPRESSOR) 100 MG tablet Take one tablet two hours prior to your cardiac CTA. 1 tablet 0   Multiple Vitamins-Minerals (MULTIVITAL PO) Take 1 tablet by mouth daily.      omeprazole (PRILOSEC) 20 MG capsule Take 1 capsule (20 mg total) by mouth daily. 30 capsule 0   triamcinolone ointment (KENALOG) 0.1 % Apply 1 application  topically as needed (break outs).     No current facility-administered medications for this visit.    Review of Systems  Constitutional: positive for fatigue Eyes: negative Ears, nose, mouth, throat, and face: negative Respiratory: positive for dyspnea on exertion and pleurisy/chest pain Cardiovascular: negative Gastrointestinal: positive for dyspepsia Genitourinary:negative Integument/breast: negative Hematologic/lymphatic: negative Musculoskeletal:negative Neurological: positive for dizziness Behavioral/Psych: negative Endocrine: negative Allergic/Immunologic: negative  Physical Exam  OAC:ZYSAY, healthy, no distress, well nourished, and well developed SKIN: skin color, texture, turgor are normal, no rashes or significant lesions HEAD: Normocephalic EYES: Pallor sclera EARS: External ears normal, Canals clear OROPHARYNX:no exudate, no erythema, and lips, buccal mucosa, and tongue normal  NECK: supple, no adenopathy, no JVD LYMPH:  no palpable lymphadenopathy, no hepatosplenomegaly BREAST:not examined LUNGS: clear to auscultation , and palpation HEART: regular rate & rhythm, no murmurs, and no gallops ABDOMEN:abdomen soft, non-tender, obese, normal bowel sounds, and no masses or organomegaly BACK: Back symmetric, no curvature., No CVA tenderness EXTREMITIES:no joint deformities, effusion, or inflammation, no edema  NEURO: alert & oriented x 3 with fluent speech, no focal motor/sensory deficits  PERFORMANCE STATUS: ECOG 0  LABORATORY DATA: Lab Results  Component Value Date   WBC 5.4 11/08/2021   HGB 7.4 (L) 11/08/2021   HCT 25.6 (L) 11/08/2021   MCV 56.3 (L) 11/08/2021   PLT 476 (H) 11/08/2021      Chemistry      Component Value Date/Time   NA 137 11/08/2021 1119   NA 142 09/13/2021 0922   NA 140 03/29/2016  1556   K 3.8 11/08/2021 1119   K 4.4 03/29/2016 1556   CL 105 11/08/2021 1119   CO2 28 11/08/2021 1119   CO2 26 03/29/2016 1556   BUN 13 11/08/2021 1119   BUN 11 09/13/2021 0922   BUN 9.3 03/29/2016 1556   CREATININE 0.78 11/08/2021 1119   CREATININE 0.8 03/29/2016 1556      Component Value Date/Time   CALCIUM 9.7 11/08/2021 1119   CALCIUM 10.3 03/29/2016 1556   ALKPHOS 54 11/08/2021 1119   ALKPHOS 85 03/29/2016 1556   AST 18 11/08/2021 1119   AST 14 03/29/2016 1556   ALT 12 11/08/2021 1119   ALT 19 03/29/2016 1556   BILITOT 0.4 11/08/2021 1119   BILITOT <0.22 03/29/2016 1556       RADIOGRAPHIC STUDIES: No results found.  ASSESSMENT: This is a very pleasant 41 years old African-American female with microcytic anemia secondary to significant iron deficiency in addition to history of sickle cell trait.  The patient has no improvement in her condition with the oral iron tablet and she also has some intolerance with upset stomach to the  ferrous sulfate.  Her anemia secondary to chronic blood loss from menorrhagia.   PLAN: I had a lengthy discussion with the patient today about her current condition and further investigation to confirm her diagnosis. I ordered several studies today including repeat CBC that showed hemoglobin of 7.4 and hematocrit 25.6 with MCV of 56.3.  Platelets count are expected to be elevated secondary to anemia with a value of 476,000. Iron studies showed serum iron of 7 with iron saturation of 1% and TIBC of 510.  Ferritin level still pending serum folate and vitamin B12 are within the normal range but low. I recommended for the patient to consider treatment with iron infusion with Venofer 300 Mg IV weekly for 3 weeks starting on 11/12/2021. I discussed with the patient the adverse effect of this treatment including fatigue, arthralgia and myalgia in addition to possibility of hypersensitivity reaction. She would like to proceed with the treatment as planned. I  will see her back for follow-up visit in 2 months for evaluation and repeat blood work and additional iron infusion if needed. The patient was also advised to continue with the oral iron tablet with vitamin C or orange juice. She was advised to call immediately if she has any other concerning symptoms in the interval.  The patient voices understanding of current disease status and treatment options and is in agreement with the current care plan.  All questions were answered. The patient knows to call the clinic with any problems, questions or concerns. We can certainly see the patient much sooner if necessary.  Thank you so much for allowing me to participate in the care of [facility]. I will continue to follow up the patient with you and assist in her care.  The total time spent in the appointment was 60 minutes.  Disclaimer: This note was dictated with voice recognition software. Similar sounding words can inadvertently be transcribed and may not be corrected upon review.   Eilleen Kempf November 08, 2021, 12:13 PM

## 2021-11-08 NOTE — Telephone Encounter (Signed)
[facility] is calling to f/u if pt is cleared for procedure

## 2021-11-10 DIAGNOSIS — H5213 Myopia, bilateral: Secondary | ICD-10-CM | POA: Diagnosis not present

## 2021-11-22 ENCOUNTER — Other Ambulatory Visit: Payer: Self-pay

## 2021-11-22 ENCOUNTER — Ambulatory Visit (HOSPITAL_BASED_OUTPATIENT_CLINIC_OR_DEPARTMENT_OTHER)
Admission: RE | Admit: 2021-11-22 | Discharge: 2021-11-22 | Disposition: A | Discharge status: Home / Self Care | Payer: Medicaid Other | Source: Ambulatory Visit | Attending: Obstetrics and Gynecology | Admitting: Obstetrics and Gynecology

## 2021-11-22 ENCOUNTER — Ambulatory Visit (HOSPITAL_BASED_OUTPATIENT_CLINIC_OR_DEPARTMENT_OTHER): Payer: Medicaid Other | Admitting: Anesthesiology

## 2021-11-22 ENCOUNTER — Encounter (HOSPITAL_BASED_OUTPATIENT_CLINIC_OR_DEPARTMENT_OTHER)
Admission: RE | Disposition: A | Discharge status: Home / Self Care | Payer: Self-pay | Source: Ambulatory Visit | Attending: Obstetrics and Gynecology

## 2021-11-22 ENCOUNTER — Encounter (HOSPITAL_BASED_OUTPATIENT_CLINIC_OR_DEPARTMENT_OTHER): Payer: Self-pay | Admitting: Obstetrics and Gynecology

## 2021-11-22 DIAGNOSIS — N840 Polyp of corpus uteri: Secondary | ICD-10-CM | POA: Insufficient documentation

## 2021-11-22 DIAGNOSIS — I10 Essential (primary) hypertension: Secondary | ICD-10-CM

## 2021-11-22 DIAGNOSIS — Z86718 Personal history of other venous thrombosis and embolism: Secondary | ICD-10-CM | POA: Insufficient documentation

## 2021-11-22 DIAGNOSIS — D649 Anemia, unspecified: Secondary | ICD-10-CM

## 2021-11-22 DIAGNOSIS — N920 Excessive and frequent menstruation with regular cycle: Secondary | ICD-10-CM

## 2021-11-22 DIAGNOSIS — K219 Gastro-esophageal reflux disease without esophagitis: Secondary | ICD-10-CM | POA: Insufficient documentation

## 2021-11-22 DIAGNOSIS — N939 Abnormal uterine and vaginal bleeding, unspecified: Secondary | ICD-10-CM | POA: Diagnosis not present

## 2021-11-22 DIAGNOSIS — Z01818 Encounter for other preprocedural examination: Secondary | ICD-10-CM

## 2021-11-22 HISTORY — PX: DILITATION & CURRETTAGE/HYSTROSCOPY WITH NOVASURE ABLATION: SHX5568

## 2021-11-22 HISTORY — DX: Gastro-esophageal reflux disease without esophagitis: K21.9

## 2021-11-22 LAB — CBC
HCT: 29.7 % — ABNORMAL LOW (ref 36.0–46.0)
Hemoglobin: 8.4 g/dL — ABNORMAL LOW (ref 12.0–15.0)
MCH: 16.5 pg — ABNORMAL LOW (ref 26.0–34.0)
MCHC: 28.3 g/dL — ABNORMAL LOW (ref 30.0–36.0)
MCV: 58.2 fL — ABNORMAL LOW (ref 80.0–100.0)
Platelets: 352 10*3/uL (ref 150–400)
RBC: 5.1 MIL/uL (ref 3.87–5.11)
RDW: 23.7 % — ABNORMAL HIGH (ref 11.5–15.5)
WBC: 4.5 10*3/uL (ref 4.0–10.5)
nRBC: 0 % (ref 0.0–0.2)

## 2021-11-22 LAB — BASIC METABOLIC PANEL
Anion gap: 9 (ref 5–15)
BUN: 12 mg/dL (ref 6–20)
CO2: 18 mmol/L — ABNORMAL LOW (ref 22–32)
Calcium: 9.2 mg/dL (ref 8.9–10.3)
Chloride: 111 mmol/L (ref 98–111)
Creatinine, Ser: 0.67 mg/dL (ref 0.44–1.00)
GFR, Estimated: >60 mL/min (ref >60)
Glucose, Bld: 92 mg/dL (ref 70–99)
Sodium: 138 mmol/L (ref 135–145)

## 2021-11-22 LAB — POCT PREGNANCY, URINE: Preg Test, Ur: NEGATIVE

## 2021-11-22 SURGERY — DILATATION & CURETTAGE/HYSTEROSCOPY WITH NOVASURE ABLATION
Anesthesia: General

## 2021-11-22 MED ORDER — FERROUS SULFATE 325 (65 FE) MG PO TABS: 325.0000 mg | ORAL_TABLET | ORAL | 1 refills | Status: AC

## 2021-11-22 MED ORDER — ACETAMINOPHEN 500 MG PO TABS
1000.0000 mg | ORAL_TABLET | Freq: Once | ORAL | Status: AC
  Administered 2021-11-22: 1000 mg via ORAL

## 2021-11-22 MED ORDER — OXYCODONE HCL 5 MG PO TABS: 5.0000 mg | ORAL_TABLET | Freq: Four times a day (QID) | ORAL | 0 refills | Status: AC | PRN

## 2021-11-22 MED ORDER — CEFAZOLIN SODIUM-DEXTROSE 2-4 GM/100ML-% IV SOLN
2.0000 g | INTRAVENOUS | Status: AC
  Administered 2021-11-22: 2 g via INTRAVENOUS

## 2021-11-22 MED ORDER — ENOXAPARIN SODIUM 40 MG/0.4ML IJ SOSY
PREFILLED_SYRINGE | INTRAMUSCULAR | Status: AC
  Filled 2021-11-22: qty 0.4

## 2021-11-22 MED ORDER — FENTANYL CITRATE (PF) 100 MCG/2ML IJ SOLN
INTRAMUSCULAR | Status: AC
  Filled 2021-11-22: qty 2

## 2021-11-22 MED ORDER — KETOROLAC TROMETHAMINE 30 MG/ML IJ SOLN
INTRAMUSCULAR | Status: AC
  Filled 2021-11-22: qty 1

## 2021-11-22 MED ORDER — LACTATED RINGERS IV SOLN: INTRAVENOUS | Status: DC

## 2021-11-22 MED ORDER — FENTANYL CITRATE (PF) 100 MCG/2ML IJ SOLN
25.0000 ug | INTRAMUSCULAR | Status: DC | PRN
  Administered 2021-11-22 (×4): 25 ug via INTRAVENOUS

## 2021-11-22 MED ORDER — OXYCODONE HCL 5 MG PO TABS
5.0000 mg | ORAL_TABLET | Freq: Once | ORAL | Status: AC
  Administered 2021-11-22: 5 mg via ORAL

## 2021-11-22 MED ORDER — GLYCOPYRROLATE 0.2 MG/ML IJ SOLN
INTRAMUSCULAR | Status: DC | PRN
  Administered 2021-11-22: .1 mg via INTRAVENOUS

## 2021-11-22 MED ORDER — PROPOFOL 10 MG/ML IV BOLUS
INTRAVENOUS | Status: DC | PRN
  Administered 2021-11-22: 200 mg via INTRAVENOUS

## 2021-11-22 MED ORDER — SODIUM CHLORIDE 0.9 % IR SOLN
Status: DC | PRN
  Administered 2021-11-22 (×2): 3000 mL

## 2021-11-22 MED ORDER — KETOROLAC TROMETHAMINE 30 MG/ML IJ SOLN
30.0000 mg | Freq: Once | INTRAMUSCULAR | Status: AC
  Administered 2021-11-22: 30 mg via INTRAVENOUS

## 2021-11-22 MED ORDER — IBUPROFEN 600 MG PO TABS: 600.0000 mg | ORAL_TABLET | Freq: Four times a day (QID) | ORAL | 1 refills | Status: AC | PRN

## 2021-11-22 MED ORDER — ACETAMINOPHEN 500 MG PO TABS
ORAL_TABLET | ORAL | Status: AC
  Filled 2021-11-22: qty 2

## 2021-11-22 MED ORDER — LIDOCAINE HCL (CARDIAC) PF 100 MG/5ML IV SOSY
PREFILLED_SYRINGE | INTRAVENOUS | Status: DC | PRN
  Administered 2021-11-22: 80 mg via INTRAVENOUS

## 2021-11-22 MED ORDER — MIDAZOLAM HCL 2 MG/2ML IJ SOLN
INTRAMUSCULAR | Status: DC | PRN
  Administered 2021-11-22: 2 mg via INTRAVENOUS

## 2021-11-22 MED ORDER — OXYCODONE HCL 5 MG PO TABS
ORAL_TABLET | ORAL | Status: AC
  Filled 2021-11-22: qty 1

## 2021-11-22 MED ORDER — ONDANSETRON HCL 4 MG/2ML IJ SOLN
INTRAMUSCULAR | Status: DC | PRN
  Administered 2021-11-22: 4 mg via INTRAVENOUS

## 2021-11-22 MED ORDER — MIDAZOLAM HCL 2 MG/2ML IJ SOLN
INTRAMUSCULAR | Status: AC
  Filled 2021-11-22: qty 2

## 2021-11-22 MED ORDER — CEFAZOLIN SODIUM-DEXTROSE 2-4 GM/100ML-% IV SOLN
INTRAVENOUS | Status: AC
  Filled 2021-11-22: qty 100

## 2021-11-22 MED ORDER — DEXAMETHASONE SODIUM PHOSPHATE 4 MG/ML IJ SOLN
INTRAMUSCULAR | Status: DC | PRN
  Administered 2021-11-22: 10 mg via INTRAVENOUS

## 2021-11-22 MED ORDER — ENOXAPARIN SODIUM 40 MG/0.4ML IJ SOSY
40.0000 mg | PREFILLED_SYRINGE | Freq: Once | INTRAMUSCULAR | Status: AC
  Administered 2021-11-22: 40 mg via SUBCUTANEOUS

## 2021-11-22 MED ORDER — LIDOCAINE HCL 1 % IJ SOLN
INTRAMUSCULAR | Status: DC | PRN
  Administered 2021-11-22: 10 mL

## 2021-11-22 MED ORDER — POVIDONE-IODINE 10 % EX SWAB: 2.0000 | Freq: Once | CUTANEOUS | Status: DC

## 2021-11-22 MED ORDER — AMISULPRIDE (ANTIEMETIC) 5 MG/2ML IV SOLN
INTRAVENOUS | Status: AC
  Filled 2021-11-22: qty 4

## 2021-11-22 MED ORDER — FENTANYL CITRATE (PF) 100 MCG/2ML IJ SOLN
INTRAMUSCULAR | Status: DC | PRN
  Administered 2021-11-22: 25 ug via INTRAVENOUS
  Administered 2021-11-22: 50 ug via INTRAVENOUS
  Administered 2021-11-22: 25 ug via INTRAVENOUS

## 2021-11-22 MED ORDER — AMISULPRIDE (ANTIEMETIC) 5 MG/2ML IV SOLN
10.0000 mg | Freq: Once | INTRAVENOUS | Status: AC | PRN
  Administered 2021-11-22: 10 mg via INTRAVENOUS

## 2021-11-22 SURGICAL SUPPLY — 16 items
GAUZE 4X4 16PLY ~~LOC~~+RFID DBL (SPONGE) ×1 IMPLANT
GLOVE BIO SURGEON STRL SZ7.5 (GLOVE) ×1 IMPLANT
GLOVE BIOGEL PI IND STRL 7.0 (GLOVE) ×1 IMPLANT
GLOVE BIOGEL PI IND STRL 7.5 (GLOVE) ×1 IMPLANT
GLOVE SURG SS PI 6.5 STRL IVOR (GLOVE) IMPLANT
GOWN STRL REUS W/TWL LRG LVL3 (GOWN DISPOSABLE) ×2 IMPLANT
IV NS IRRIG 3000ML ARTHROMATIC (IV SOLUTION) IMPLANT
KIT PROCEDURE FLUENT (KITS) ×1 IMPLANT
KIT TURNOVER CYSTO (KITS) ×1 IMPLANT
PACK VAGINAL MINOR WOMEN LF (CUSTOM PROCEDURE TRAY) ×1 IMPLANT
PAD OB MATERNITY 4.3X12.25 (PERSONAL CARE ITEMS) ×1 IMPLANT
PAD PREP 24X48 CUFFED NSTRL (MISCELLANEOUS) ×1 IMPLANT
SEAL ROD LENS SCOPE MYOSURE (ABLATOR) ×1 IMPLANT
SET GENESYS HTA PROCERVA (MISCELLANEOUS) IMPLANT
SOL PREP POV-IOD 4OZ 10% (MISCELLANEOUS) IMPLANT
TOWEL OR 17X26 10 PK STRL BLUE (TOWEL DISPOSABLE) ×2 IMPLANT

## 2021-11-22 NOTE — Anesthesia Preprocedure Evaluation (Signed)
Anesthesia Evaluation  Patient identified by MRN, date of birth, ID band Patient awake    Reviewed: Allergy & Precautions, NPO status , Patient's Chart, lab work & pertinent test results  Airway Mallampati: II  TM Distance: >3 FB Neck ROM: Full    Dental  (+) Dental Advisory Given   Pulmonary neg pulmonary ROS,    breath sounds clear to auscultation       Cardiovascular hypertension, Pt. on medications and Pt. on home beta blockers  Rhythm:Regular Rate:Normal     Neuro/Psych negative neurological ROS     GI/Hepatic Neg liver ROS, GERD  ,  Endo/Other  negative endocrine ROS  Renal/GU negative Renal ROS     Musculoskeletal   Abdominal   Peds  Hematology  (+) Blood dyscrasia, anemia ,   Anesthesia Other Findings   Reproductive/Obstetrics                             Lab Results  Component Value Date   WBC 5.4 11/08/2021   HGB 7.4 (L) 11/08/2021   HCT 25.6 (L) 11/08/2021   MCV 56.3 (L) 11/08/2021   PLT 476 (H) 11/08/2021   Lab Results  Component Value Date   CREATININE 0.78 11/08/2021   BUN 13 11/08/2021   NA 137 11/08/2021   K 3.8 11/08/2021   CL 105 11/08/2021   CO2 28 11/08/2021    Anesthesia Physical Anesthesia Plan  ASA: 3  Anesthesia Plan: General   Post-op Pain Management: Tylenol PO (pre-op)* and Toradol IV (intra-op)*   Induction: Intravenous  PONV Risk Score and Plan: 3 and Dexamethasone, Ondansetron, Midazolam and Treatment may vary due to age or medical condition  Airway Management Planned: LMA  Additional Equipment:   Intra-op Plan:   Post-operative Plan: Extubation in OR  Informed Consent: I have reviewed the patients History and Physical, chart, labs and discussed the procedure including the risks, benefits and alternatives for the proposed anesthesia with the patient or authorized representative who has indicated his/her understanding and acceptance.      Dental advisory given  Plan Discussed with: CRNA  Anesthesia Plan Comments:         Anesthesia Quick Evaluation

## 2021-11-22 NOTE — Op Note (Signed)
Preop Diagnosis: EXCESS AND FREQUENT MENSTRUATION   Postop Diagnosis: EXCESS AND FREQUENT MENSTRUATION   Procedure: DILATATION & CURETTAGE/HYSTEROSCOPY WITH HYDROTHERMAL ABLATION   Anesthesia: Choice   Anesthesiologist: Dr. Ola Spurr   Attending: Everett Graff, MD   Assistant: N/a  Findings: Endometrial polyps  Pathology: Endometrial curettings and polyps  Fluids: 700 cc  UOP: Voided prior to procedure  EBL: 5 cc  Complications: None  Procedure: The patient was taken to the operating room after the risks, benefits and alternatives discussed with the patient. The patient verbalized understanding and consent signed and witnessed. The patient was given a spinal per anesthesia and prepped and draped in the normal sterile fashion and Time Out performed per protocol. A bivalve speculum was placed in the patient's vagina and the anterior lip of the cervix was grasped with a single tooth tenaculum. A paracervical block was administered using a total of 10 cc of 1% lidocaine. The uterus sounded to 13 cm. The cervix was dilated for passage of the hysteroscope. The hysteroscope was introduced into the uterine cavity and findings as noted above. Sharp curettage was performed until a gritty texture was noted and currettings sent to pathology. The hysteroscope was reintroduced and no obvious remaining intracavitary lesions were noted. The HTA instrument was introduced and ablation performed without difficulty.  Good ablation results were noted. All instruments were removed. Sponge lap and needle count was correct. The patient tolerated the procedure well and was returned to the recovery room in good condition.

## 2021-11-22 NOTE — Transfer of Care (Signed)
Immediate Anesthesia Transfer of Care Note  Patient: Brittney Sandoval  Procedure(s) Performed: DILATATION & CURETTAGE/HYSTEROSCOPY WITH HYDROTHERMAL  ABLATION  Patient Location: PACU  Anesthesia Type:General  Level of Consciousness: awake and patient cooperative  Airway & Oxygen Therapy: Patient Spontanous Breathing and Patient connected to nasal cannula oxygen  Post-op Assessment: Report given to RN and Post -op Vital signs reviewed and stable  Post vital signs: Reviewed and stable  Last Vitals:  Vitals Value Taken Time  BP 143/106 11/22/21 1334  Temp    Pulse 90 11/22/21 1335  Resp 9 11/22/21 1336  SpO2 100 % 11/22/21 1335  Vitals shown include unvalidated device data.  Last Pain:  Vitals:   11/22/21 1048  TempSrc: Oral  PainSc: 2       Patients Stated Pain Goal: 4 (35/68/61 6837)  Complications: No notable events documented.

## 2021-11-22 NOTE — H&P (Signed)
Brittney Sandoval is an 41 y.o. female. Patient presents for scheduled hysteroscopy D&C endometrial ablation.  Pertinent Gynecological History:  OB History: G5, P5   Menstrual History:  Patient's last menstrual period was 11/01/2021.    Past Medical History:  Diagnosis Date   Anemia    DVT (deep venous thrombosis) ([place])    a. 11/2015 U/S: Left Peroneal DVT;  b. 02/2016 U/S: No DVT bilat.   Essential hypertension    a. 01/2016 Echo: EF 60-65%, no rwma, triv MR/TR, PASP 106mHg.   Fibroid    GERD (gastroesophageal reflux disease)    [place]oli ([place]    a. 11/2015 CTA Chest: PE involving the segmental and subsegmental branches of the RML & RLL-->Xarelto.    Past Surgical History:  Procedure Laterality Date   CESAREAN SECTION N/A 04/04/2013   Procedure: CESAREAN SECTION;  Surgeon: MEmily Filbert MD;  [place] [facility];  Service: Obstetrics;  Laterality: N/A;   CESAREAN SECTION N/A 11/25/2015   Procedure: CESAREAN SECTION;  Surgeon: AEverett Graff MD;  [place] [facility]  Service: Obstetrics;  Laterality: N/A;   LAPAROSCOPIC [place]LECYSTECTOMY  2010   [place]      Family History  Problem Relation Age of Onset   Diabetes Mother    Hypertension Mother    Kidney disease Mother    Heart disease Mother     Social History:  reports that she has never smoked. She has never used smokeless tobacco. She reports current alcohol use. She reports that she does not use drugs.  Allergies: No Known Allergies  No medications prior to admission.    Review of Systems Denies F/C/N/V/D  Height '5\' 4"'$  (1.626 m), weight 93 kg, last menstrual period 11/01/2021. Physical Exam Lungs CTA CV RRR Abdomen soft, NT Extremities no calf tenderness  Negative embx in epic from 08-09-21  Assessment/Plan: G5P5 presenting for hysteroscopy D&C endometrial ablation d/t symptomatic fibroids (IM and subserosal) with anemia.  Cardiology clearance  obtained.  Pt referred to hematology but not seen yet and general surgery for pelvic mass.  H/o DVT after a pregnancy, will give a shot of prophylactic lovenox prior to discharge from hospital.  [facility]  ADelice Lesch9/25/2023, 8:59 AM

## 2021-11-22 NOTE — Anesthesia Procedure Notes (Signed)
Procedure Name: LMA Insertion Date/Time: 11/22/2021 12:33 PM  Performed by: Georgeanne Nim, CRNAPre-anesthesia Checklist: Patient identified, Emergency Drugs available, Suction available, Patient being monitored and Timeout performed Patient Re-evaluated:Patient Re-evaluated prior to induction Oxygen Delivery Method: Circle system utilized Preoxygenation: Pre-oxygenation with 100% oxygen Induction Type: IV induction Ventilation: Mask ventilation without difficulty LMA: LMA inserted LMA Size: 4.0 Number of attempts: 1 Placement Confirmation: positive ETCO2, CO2 detector and breath sounds checked- equal and bilateral Tube secured with: Tape Dental Injury: Teeth and Oropharynx as per pre-operative assessment

## 2021-11-22 NOTE — Discharge Instructions (Addendum)
No acetaminophen/Tylenol until after 5:00pm today if needed for pain.     Post Anesthesia Home Care Instructions  Activity: Get plenty of rest for the remainder of the day. A responsible individual must stay with you for 24 hours following the procedure.  For the next 24 hours, DO NOT: -Drive a car -Paediatric nurse -Drink alcoholic beverages -Take any medication unless instructed by your physician -Make any legal decisions or sign important papers.  Meals: Start with liquid foods such as gelatin or soup. Progress to regular foods as tolerated. Avoid greasy, spicy, heavy foods. If nausea and/or vomiting occur, drink only clear liquids until the nausea and/or vomiting subsides. Call your physician if vomiting continues.  Special Instructions/Symptoms: Your throat may feel dry or sore from the anesthesia or the breathing tube placed in your throat during surgery. If this causes discomfort, gargle with warm salt water. The discomfort should disappear within 24 hours.

## 2021-11-23 ENCOUNTER — Encounter (HOSPITAL_BASED_OUTPATIENT_CLINIC_OR_DEPARTMENT_OTHER): Payer: Self-pay | Admitting: Obstetrics and Gynecology

## 2021-11-23 LAB — SURGICAL PATHOLOGY

## 2021-11-25 NOTE — Anesthesia Postprocedure Evaluation (Signed)
Anesthesia Post Note  Patient: Brittney Sandoval  Procedure(s) Performed: DILATATION & CURETTAGE/HYSTEROSCOPY WITH HYDROTHERMAL  ABLATION     Patient location during evaluation: PACU Anesthesia Type: General Level of consciousness: awake and alert Pain management: pain level controlled Vital Signs Assessment: post-procedure vital signs reviewed and stable Respiratory status: spontaneous breathing, nonlabored ventilation, respiratory function stable and patient connected to nasal cannula oxygen Cardiovascular status: blood pressure returned to baseline and stable Postop Assessment: no apparent nausea or vomiting Anesthetic complications: no   No notable events documented.  Last Vitals:  Vitals:   11/22/21 1605 11/22/21 1650  BP: 126/71 114/73  Pulse: 72 71  Resp: 18 16  Temp:  36.8 C  SpO2: 100% 100%    Last Pain:  Vitals:   11/23/21 0951  TempSrc:   PainSc: 4                  Tiajuana Amass

## 2021-12-07 ENCOUNTER — Telehealth: Payer: Self-pay | Admitting: Pharmacy Technician

## 2021-12-07 ENCOUNTER — Other Ambulatory Visit: Payer: Self-pay | Admitting: Pharmacy Technician

## 2021-12-07 NOTE — Telephone Encounter (Signed)
Auth Submission: NO AUTH NEEDED Payer: [place] [employer] [place] Medication & CPT/J Code(s) submitted: Venofer (Iron Sucrose) J1756 Route of submission (phone, fax, portal):  Phone # Fax # Auth type: Buy/Bill Units/visits requested: X3 DOSES Reference number:  Approval from: 12/07/21 to 02/27/22

## 2021-12-09 DIAGNOSIS — Z09 Encounter for follow-up examination after completed treatment for conditions other than malignant neoplasm: Secondary | ICD-10-CM | POA: Diagnosis not present

## 2021-12-14 ENCOUNTER — Ambulatory Visit: Payer: Medicaid Other

## 2021-12-14 MED ORDER — SODIUM CHLORIDE 0.9 % IV SOLN
300.0000 mg | INTRAVENOUS | Status: AC
  Filled 2021-12-14: qty 15

## 2021-12-21 ENCOUNTER — Ambulatory Visit (INDEPENDENT_AMBULATORY_CARE_PROVIDER_SITE_OTHER): Payer: Medicaid Other

## 2021-12-21 VITALS — BP 127/77 | HR 75 | Temp 98.5°F | Resp 16 | Ht 64.0 in | Wt 206.2 lb

## 2021-12-21 DIAGNOSIS — N920 Excessive and frequent menstruation with regular cycle: Secondary | ICD-10-CM

## 2021-12-21 DIAGNOSIS — D500 Iron deficiency anemia secondary to blood loss (chronic): Secondary | ICD-10-CM

## 2021-12-21 MED ORDER — ACETAMINOPHEN 325 MG PO TABS
650.0000 mg | ORAL_TABLET | Freq: Once | ORAL | Status: AC
  Administered 2021-12-21: 650 mg via ORAL
  Filled 2021-12-21: qty 2

## 2021-12-21 MED ORDER — DIPHENHYDRAMINE HCL 25 MG PO CAPS
25.0000 mg | ORAL_CAPSULE | Freq: Once | ORAL | Status: AC
  Administered 2021-12-21: 25 mg via ORAL
  Filled 2021-12-21: qty 1

## 2021-12-21 MED ORDER — SODIUM CHLORIDE 0.9 % IV SOLN
300.0000 mg | INTRAVENOUS | Status: DC
  Administered 2021-12-21: 300 mg via INTRAVENOUS
  Filled 2021-12-21: qty 15

## 2021-12-21 NOTE — Progress Notes (Signed)
Diagnosis: Iron Deficiency Anemia  Provider:  Marshell Garfinkel MD  Procedure: Infusion  IV Type: Peripheral, IV Location: L Antecubital  Venofer (Iron Sucrose), Dose: 300 mg  Infusion Start Time: 1120  Infusion Stop Time: 1300  Post Infusion IV Care: Observation period completed and Peripheral IV Discontinued  Discharge: Condition: Good, Destination: Home . AVS provided to patient.   Performed by:  Adelina Mings, LPN

## 2021-12-28 ENCOUNTER — Ambulatory Visit (INDEPENDENT_AMBULATORY_CARE_PROVIDER_SITE_OTHER): Payer: Medicaid Other

## 2021-12-28 VITALS — BP 133/80 | HR 72 | Temp 97.4°F | Resp 18 | Ht 64.0 in | Wt 207.0 lb

## 2021-12-28 DIAGNOSIS — D500 Iron deficiency anemia secondary to blood loss (chronic): Secondary | ICD-10-CM | POA: Diagnosis not present

## 2021-12-28 MED ORDER — DIPHENHYDRAMINE HCL 25 MG PO CAPS
25.0000 mg | ORAL_CAPSULE | Freq: Once | ORAL | Status: AC
  Administered 2021-12-28: 25 mg via ORAL
  Filled 2021-12-28: qty 1

## 2021-12-28 MED ORDER — SODIUM CHLORIDE 0.9 % IV SOLN
300.0000 mg | INTRAVENOUS | Status: DC
  Administered 2021-12-28: 300 mg via INTRAVENOUS
  Filled 2021-12-28: qty 15

## 2021-12-28 MED ORDER — ACETAMINOPHEN 325 MG PO TABS
650.0000 mg | ORAL_TABLET | Freq: Once | ORAL | Status: AC
  Administered 2021-12-28: 650 mg via ORAL
  Filled 2021-12-28: qty 2

## 2021-12-28 NOTE — Progress Notes (Signed)
Diagnosis: Iron Deficiency Anemia  Provider:  Marshell Garfinkel MD  Procedure: Infusion  IV Type: Peripheral, IV Location: R Antecubital  Venofer (Iron Sucrose), Dose: 300 mg  Infusion Start Time: 0502  Infusion Stop Time: 5615  Post Infusion IV Care: Peripheral IV Discontinued  Discharge: Condition: Good, Destination: Home . AVS provided to patient.   Performed by:  Adelina Mings, LPN

## 2022-01-04 ENCOUNTER — Ambulatory Visit (INDEPENDENT_AMBULATORY_CARE_PROVIDER_SITE_OTHER): Payer: Medicaid Other

## 2022-01-04 VITALS — BP 135/77 | HR 64 | Temp 98.2°F | Resp 18 | Ht 64.0 in | Wt 209.0 lb

## 2022-01-04 DIAGNOSIS — D500 Iron deficiency anemia secondary to blood loss (chronic): Secondary | ICD-10-CM

## 2022-01-04 MED ORDER — ACETAMINOPHEN 325 MG PO TABS
650.0000 mg | ORAL_TABLET | Freq: Once | ORAL | Status: AC
  Administered 2022-01-04: 650 mg via ORAL
  Filled 2022-01-04: qty 2

## 2022-01-04 MED ORDER — DIPHENHYDRAMINE HCL 25 MG PO CAPS
25.0000 mg | ORAL_CAPSULE | Freq: Once | ORAL | Status: AC
  Administered 2022-01-04: 25 mg via ORAL
  Filled 2022-01-04: qty 1

## 2022-01-04 MED ORDER — SODIUM CHLORIDE 0.9 % IV SOLN
300.0000 mg | Freq: Once | INTRAVENOUS | Status: AC
  Administered 2022-01-04: 300 mg via INTRAVENOUS
  Filled 2022-01-04: qty 15

## 2022-01-04 NOTE — Progress Notes (Signed)
Diagnosis: Iron Deficiency Anemia  Provider:  Marshell Garfinkel MD  Procedure: Infusion  IV Type: Peripheral, IV Location: L Antecubital  Venofer (Iron Sucrose), Dose: 300 mg  Infusion Start Time: 1443  Infusion Stop Time: 1225  Post Infusion IV Care: Peripheral IV Discontinued  Discharge: Condition: Good, Destination: Home . AVS provided to patient.   Performed by:  Arnoldo Morale, RN

## 2022-01-11 ENCOUNTER — Inpatient Hospital Stay (HOSPITAL_BASED_OUTPATIENT_CLINIC_OR_DEPARTMENT_OTHER): Payer: Medicaid Other | Admitting: Internal Medicine

## 2022-01-11 ENCOUNTER — Inpatient Hospital Stay: Payer: Medicaid Other | Attending: Hematology

## 2022-01-11 VITALS — BP 135/90 | HR 59 | Temp 98.3°F | Resp 16 | Wt 210.6 lb

## 2022-01-11 DIAGNOSIS — D500 Iron deficiency anemia secondary to blood loss (chronic): Secondary | ICD-10-CM | POA: Insufficient documentation

## 2022-01-11 DIAGNOSIS — I10 Essential (primary) hypertension: Secondary | ICD-10-CM | POA: Diagnosis not present

## 2022-01-11 DIAGNOSIS — Z86711 Personal history of pulmonary embolism: Secondary | ICD-10-CM | POA: Insufficient documentation

## 2022-01-11 DIAGNOSIS — N920 Excessive and frequent menstruation with regular cycle: Secondary | ICD-10-CM | POA: Insufficient documentation

## 2022-01-11 DIAGNOSIS — D573 Sickle-cell trait: Secondary | ICD-10-CM | POA: Insufficient documentation

## 2022-01-11 DIAGNOSIS — Z79899 Other long term (current) drug therapy: Secondary | ICD-10-CM | POA: Diagnosis not present

## 2022-01-11 DIAGNOSIS — Z86718 Personal history of other venous thrombosis and embolism: Secondary | ICD-10-CM | POA: Insufficient documentation

## 2022-01-11 LAB — IRON AND IRON BINDING CAPACITY (CC-WL,HP ONLY)
Iron: 79 ug/dL (ref 28–170)
Saturation Ratios: 23 % (ref 10.4–31.8)
TIBC: 349 ug/dL (ref 250–450)
UIBC: 270 ug/dL (ref 148–442)

## 2022-01-11 LAB — CBC WITH DIFFERENTIAL (CANCER CENTER ONLY)
Abs Immature Granulocytes: 0.01 10*3/uL (ref 0.00–0.07)
Basophils Absolute: 0 10*3/uL (ref 0.0–0.1)
Basophils Relative: 1 %
Eosinophils Absolute: 0.1 10*3/uL (ref 0.0–0.5)
Eosinophils Relative: 3 %
HCT: 36.7 % (ref 36.0–46.0)
Hemoglobin: 10.8 g/dL — ABNORMAL LOW (ref 12.0–15.0)
Immature Granulocytes: 0 %
Lymphocytes Relative: 40 %
Lymphs Abs: 1.7 10*3/uL (ref 0.7–4.0)
MCH: 17.8 pg — ABNORMAL LOW (ref 26.0–34.0)
MCHC: 29.4 g/dL — ABNORMAL LOW (ref 30.0–36.0)
MCV: 60.5 fL — ABNORMAL LOW (ref 80.0–100.0)
Monocytes Absolute: 0.3 10*3/uL (ref 0.1–1.0)
Monocytes Relative: 8 %
Neutro Abs: 2.1 10*3/uL (ref 1.7–7.7)
Neutrophils Relative %: 48 %
Platelet Count: 335 10*3/uL (ref 150–400)
RBC: 6.07 MIL/uL — ABNORMAL HIGH (ref 3.87–5.11)
RDW: 28.3 % — ABNORMAL HIGH (ref 11.5–15.5)
WBC Count: 4.3 10*3/uL (ref 4.0–10.5)
nRBC: 0 % (ref 0.0–0.2)

## 2022-01-11 LAB — FERRITIN: Ferritin: 140 ng/mL (ref 11–307)

## 2022-01-11 NOTE — Progress Notes (Signed)
[facility] Telephone:([phone]) [phone]   Fax:([phone]) [phone]  OFFICE PROGRESS NOTE  Andria Frames, PA-C [place] [address]  DIAGNOSIS: Microcytic anemia secondary to significant iron deficiency in addition to history of sickle cell trait.  She has intolerance to oral iron tablet with no benefit.  PRIOR THERAPY: Iron infusion with Venofer 300 Mg IV weekly for 3 weeks.  Last dose was given in early October 2023.  CURRENT THERAPY: None  INTERVAL HISTORY: Brittney Sandoval 41 y.o. female returns to the clinic today for follow-up visit.  The patient is feeling fine today with no concerning complaints except for very mild fatigue.  She has no more craving for ice.  She denied having any chest pain, shortness of breath, cough or hemoptysis.  She denied having any dizzy spells.  She had a uterine ablation for fibroid tumor.  She denied having any recent weight loss or night sweats.  She tolerated the iron infusion fairly well.  She is here today for evaluation and repeat blood work.  MEDICAL HISTORY: Past Medical History:  Diagnosis Date   Anemia    DVT (deep venous thrombosis) ([place])    a. 11/2015 U/S: Left Peroneal DVT;  b. 02/2016 U/S: No DVT bilat.   Essential hypertension    a. 01/2016 Echo: EF 60-65%, no rwma, triv MR/TR, PASP 59mHg.   Fibroid    GERD (gastroesophageal reflux disease)    [place]ulmonary [place]oli [place]    a. 11/2015 CTA Chest: PE involving the segmental and subsegmental branches of the RML & RLL-->Xarelto.    ALLERGIES:  has No Known Allergies.  MEDICATIONS:  Current Outpatient Medications  Medication Sig Dispense Refill   amLODipine (NORVASC) 10 MG tablet TAKE 1 TABLET BY MOUTH DAILY (Patient taking differently: Take 10 mg by mouth daily.) 90 tablet 3   Cholecalciferol 25 MCG (1000 UT) tablet Take 2,000 Units by mouth daily.     ferrous sulfate 325 (65 FE) MG tablet Take 1 tablet (325 mg total) by mouth every other  day. 30 tablet 1   hydrochlorothiazide (HYDRODIURIL) 12.5 MG tablet Take 12.5 mg by mouth daily.     ibuprofen (ADVIL) 600 MG tablet Take 1 tablet (600 mg total) by mouth every 6 (six) hours as needed for moderate pain. 30 tablet 1   metoprolol tartrate (LOPRESSOR) 100 MG tablet Take one tablet two hours prior to your cardiac CTA. 1 tablet 0   Multiple Vitamins-Minerals (MULTIVITAL PO) Take 1 tablet by mouth daily.     omeprazole (PRILOSEC) 20 MG capsule Take 1 capsule (20 mg total) by mouth daily. 30 capsule 0   oxyCODONE (ROXICODONE) 5 MG immediate release tablet Take 1 tablet (5 mg total) by mouth every 6 (six) hours as needed for severe pain. 10 tablet 0   triamcinolone ointment (KENALOG) 0.1 % Apply 1 application  topically as needed (break outs).     No current facility-administered medications for this visit.    SURGICAL HISTORY:  Past Surgical History:  Procedure Laterality Date   CESAREAN SECTION N/A 04/04/2013   Procedure: CESAREAN SECTION;  Surgeon: MEmily Filbert MD;  [facility];  Service: Obstetrics;  Laterality: N/A;   CESAREAN SECTION N/A 11/25/2015   Procedure: CESAREAN SECTION;  Surgeon: AEverett Graff MD;  [facility]  Service: Obstetrics;  Laterality: N/A;   DILITATION & CURRETTAGE/HYSTROSCOPY WITH NOVASURE ABLATION N/A 11/22/2021   Procedure: DILATATION & CURETTAGE/HYSTEROSCOPY WITH HYDROTHERMAL  ABLATION;  Surgeon: Everett Graff, MD;  [facility];  Service: Gynecology;  Laterality: N/A;   LAPAROSCOPIC CHOLECYSTECTOMY  2010   [place]   TONSILLECTOMY      REVIEW OF SYSTEMS:  A comprehensive review of systems was negative except for: Constitutional: positive for fatigue   PHYSICAL EXAMINATION: General appearance: alert, cooperative, fatigued, and no distress Head: Normocephalic, without obvious abnormality, atraumatic Neck: no adenopathy, no JVD, supple, symmetrical, trachea midline, and thyroid not  enlarged, symmetric, no tenderness/mass/nodules Lymph nodes: Cervical, supraclavicular, and axillary nodes normal. Resp: clear to auscultation bilaterally Back: symmetric, no curvature. ROM normal. No CVA tenderness. Cardio: regular rate and rhythm, S1, S2 normal, no murmur, click, rub or gallop GI: soft, non-tender; bowel sounds normal; no masses,  no organomegaly Extremities: extremities normal, atraumatic, no cyanosis or edema  ECOG PERFORMANCE STATUS: 1 - Symptomatic but completely ambulatory  Blood pressure (!) 135/90, pulse (!) 59, temperature 98.3 F (36.8 C), temperature source Oral, resp. rate 16, weight 210 lb 9 oz (95.5 kg), SpO2 100 %.  LABORATORY DATA: Lab Results  Component Value Date   WBC 4.3 01/11/2022   HGB 10.8 (L) 01/11/2022   HCT 36.7 01/11/2022   MCV 60.5 (L) 01/11/2022   PLT 335 01/11/2022      Chemistry      Component Value Date/Time   NA 138 11/22/2021 1100   NA 142 09/13/2021 0922   NA 140 03/29/2016 1556   K  11/22/2021 1100    SPECIMEN HEMOLYZED. HEMOLYSIS MAY AFFECT INTEGRITY OF RESULTS.   K 4.4 03/29/2016 1556   CL 111 11/22/2021 1100   CO2 18 (L) 11/22/2021 1100   CO2 26 03/29/2016 1556   BUN 12 11/22/2021 1100   BUN 11 09/13/2021 0922   BUN 9.3 03/29/2016 1556   CREATININE 0.67 11/22/2021 1100   CREATININE 0.78 11/08/2021 1119   CREATININE 0.8 03/29/2016 1556      Component Value Date/Time   CALCIUM 9.2 11/22/2021 1100   CALCIUM 10.3 03/29/2016 1556   ALKPHOS 54 11/08/2021 1119   ALKPHOS 85 03/29/2016 1556   AST 18 11/08/2021 1119   AST 14 03/29/2016 1556   ALT 12 11/08/2021 1119   ALT 19 03/29/2016 1556   BILITOT 0.4 11/08/2021 1119   BILITOT <0.22 03/29/2016 1556       RADIOGRAPHIC STUDIES: No results found.  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 The patient was treated with iron infusion with Venofer 300 Mg IV weekly for 3 weeks and tolerated her treatment fairly well. Repeat CBC today showed improvement of her hemoglobin up to 10.8 and hematocrit of 36.7.  Iron study and ferritin are still pending I recommended for the patient to continue on observation for now with repeat CBC, iron study and ferritin in 3 months. If the pending iron studies showed significant deficiency, I will arrange for the patient to receive additional iron infusion in the interval. The patient was advised to call immediately if she has any other concerning symptoms in the interval. The patient voices understanding of current disease status and treatment options and is in agreement with the current care plan.  All questions were answered. The patient knows to call the clinic with any problems, questions or concerns. We can certainly see the patient much sooner if necessary.  The total  time spent in the appointment was 25 minutes.  Disclaimer: This note was dictated with voice recognition software. Similar sounding words can inadvertently be transcribed and may not be corrected upon review.

## 2022-01-28 DIAGNOSIS — I10 Essential (primary) hypertension: Secondary | ICD-10-CM | POA: Diagnosis not present

## 2022-01-28 DIAGNOSIS — Z6836 Body mass index (BMI) 36.0-36.9, adult: Secondary | ICD-10-CM | POA: Diagnosis not present

## 2022-01-28 DIAGNOSIS — K219 Gastro-esophageal reflux disease without esophagitis: Secondary | ICD-10-CM | POA: Diagnosis not present

## 2022-01-28 DIAGNOSIS — Z803 Family history of malignant neoplasm of breast: Secondary | ICD-10-CM | POA: Diagnosis not present

## 2022-01-28 DIAGNOSIS — R7303 Prediabetes: Secondary | ICD-10-CM | POA: Diagnosis not present

## 2022-02-24 ENCOUNTER — Telehealth: Payer: Self-pay | Admitting: Internal Medicine

## 2022-02-24 NOTE — Telephone Encounter (Signed)
Called patient to r/s appointments. Patient notified.

## 2022-04-07 ENCOUNTER — Other Ambulatory Visit: Payer: Self-pay | Admitting: Physician Assistant

## 2022-04-07 DIAGNOSIS — Z1231 Encounter for screening mammogram for malignant neoplasm of breast: Secondary | ICD-10-CM

## 2022-04-15 ENCOUNTER — Ambulatory Visit (INDEPENDENT_AMBULATORY_CARE_PROVIDER_SITE_OTHER): Payer: Medicaid Other | Admitting: Obstetrics and Gynecology

## 2022-04-15 ENCOUNTER — Encounter: Payer: Self-pay | Admitting: Obstetrics and Gynecology

## 2022-04-15 VITALS — BP 126/82 | HR 73 | Ht 64.5 in | Wt 217.0 lb

## 2022-04-15 DIAGNOSIS — K668 Other specified disorders of peritoneum: Secondary | ICD-10-CM | POA: Diagnosis not present

## 2022-04-15 DIAGNOSIS — R350 Frequency of micturition: Secondary | ICD-10-CM | POA: Diagnosis not present

## 2022-04-15 LAB — POCT URINALYSIS DIPSTICK
Bilirubin, UA: NEGATIVE
Glucose, UA: NEGATIVE
Ketones, UA: NEGATIVE
Leukocytes, UA: NEGATIVE
Nitrite, UA: NEGATIVE
Protein, UA: NEGATIVE
Spec Grav, UA: >=1.03 — AB (ref 1.010–1.025)
Urobilinogen, UA: 1 E.U./dL
pH, UA: 5.5 (ref 5.0–8.0)

## 2022-04-15 NOTE — Progress Notes (Signed)
[facility] New Patient Evaluation and Consultation  Referring Provider: Everett Graff, MD PCP: Brittney Sandoval Date of Service: 04/15/2022  SUBJECTIVE Chief Complaint: New Patient (Initial Visit) (Brittney Sandoval is a 42 y.o. female is here for pelvic cyst.)  History of Present Illness: Brittney Sandoval is a 42 y.o. Black or African-American female seen in consultation at the request of Dr. Mancel Sandoval for evaluation of a cyst.    Review of records significant for: Pelvic MRI from 10/31/2012:  IMPRESSION:  1. Large unilocular cystic lesion extends from the cul-de-sac  through the left levator ani and into the subcutaneous tissues of  the left buttock. Top differential diagnostic considerations include  peritoneal inclusion cyst, lymphangioma, or tailgut duplication  cyst.  2. Suspected small uterine intramural fibroids.   Discussed was made at the time of scan to refer to surgical oncology at [facility].   Urinary Symptoms: Does not leak urine.   Day time voids 8.  Nocturia: 2-3 times per night to void. Voiding dysfunction: she empties her bladder well.  does not use a catheter to empty bladder.    UTIs:  0  UTI's in the last year.   Denies history of blood in urine and kidney or bladder stones  Pelvic Organ Prolapse Symptoms:                  She Denies a feeling of a bulge the vaginal area.   Bowel Symptom: Bowel movements: every other day Stool consistency: soft  Straining: yes.  Splinting: yes.  Incomplete evacuation: yes.  Denies accidental bowel leakage / fecal incontinence   Sexual Function Sexually active: no.  Sexual orientation:  heterosexual Pain with sex: No  Pelvic Pain Denies pelvic pain She does report a fullness in her buttock area on the left that swells as the cyst has gotten bigger. She reports that the cyst has been drained several times in the past and cytology was sent that was negative for cancer.   Past  Medical History:  Past Medical History:  Diagnosis Date   Anemia    DVT (deep venous thrombosis) ([place])    a. 11/2015 U/S: Left Peroneal DVT;  b. 02/2016 U/S: No DVT bilat.   Essential hypertension    a. 01/2016 Echo: EF 60-65%, no rwma, triv MR/TR, PASP 70mHg.   Fibroid    GERD (gastroesophageal reflux disease)    [place]oli [place]    a. 11/2015 CTA Chest: PE involving the segmental and subsegmental branches of the RML & RLL-->Xarelto.     Past Surgical History:   Past Surgical History:  Procedure Laterality Date   CESAREAN SECTION N/A 04/04/2013   Procedure: CESAREAN SECTION;  Surgeon: MEmily Filbert MD;  [facility];  Service: Obstetrics;  Laterality: N/A;   CESAREAN SECTION N/A 11/25/2015   Procedure: CESAREAN SECTION;  Surgeon: AEverett Graff MD;  [facility]  Service: Obstetrics;  Laterality: N/A;   DILITATION & CURRETTAGE/HYSTROSCOPY WITH NOVASURE ABLATION N/A 11/22/2021   Procedure: DILATATION & CURETTAGE/HYSTEROSCOPY WITH HYDROTHERMAL  ABLATION;  Surgeon: REverett Graff MD;  [place] [facility]  Service: Gynecology;  Laterality: N/A;   [place]       Past OB/GYN History: OB History  Gravida Para Term Preterm AB Living  5 5 5     5  $ SAB IAB Ectopic Multiple Live Births        0 5    #  Outcome Date GA Lbr Len/2nd Weight Sex Delivery Anes PTL Lv  5 Term 11/25/15 [redacted]w[redacted]d 7 lb 11.5 oz (3.5 kg) F CS-LTranv Spinal  LIV  4 Term 04/04/13 [id number] lb 5.8 oz (2.885 kg) M CS-LTranv   LIV  3 Term 01/25/07   6 lb 7 oz (2.92 kg)  Vag-Spont EPI  LIV  2 Term 07/16/04   6 lb 4 oz (2.835 kg) F Vag-Spont EPI  LIV  1 Term 11/30/96   8 lb 6 oz (3.799 kg) F Vag-Spont EPI  LIV      Medications: She has a current medication list which includes the following prescription(s): amlodipine, ferrous sulfate, hydrochlorothiazide, ibuprofen, multiple vitamins-minerals, oxycodone,  ozempic (1 mg/dose), triamcinolone ointment, and omeprazole.   Allergies: Patient has No Known Allergies.   Social History:  Social History   Tobacco Use   Smoking status: Never   Smokeless tobacco: Never  Vaping Use   Vaping Use: Never used  Substance Use Topics   Alcohol use: Yes    Comment: occassionally   Drug use: No    Family History:   Family History  Problem Relation Age of Onset   Diabetes Mother    Hypertension Mother    Kidney disease Mother    Heart disease Mother    Hypertension Father      Review of Systems: Review of Systems  Constitutional:  Positive for malaise/fatigue. Negative for fever and weight loss.  Respiratory:  Negative for cough, shortness of breath and wheezing.   Cardiovascular:  Negative for chest pain, palpitations and leg swelling.  Gastrointestinal:  Negative for abdominal pain and blood in stool.  Genitourinary:  Negative for dysuria.  Musculoskeletal:  Negative for myalgias.  Skin:  Negative for rash.  Neurological:  Negative for dizziness and headaches.  Endo/Heme/Allergies:  Does not bruise/bleed easily.  Psychiatric/Behavioral:  Negative for depression. The patient is not nervous/anxious.      OBJECTIVE Physical Exam: Vitals:   04/15/22 1314  BP: 126/82  Pulse: 73  Weight: 217 lb (98.4 kg)  Height: 5' 4.5" (1.638 m)    Physical Exam Constitutional:      General: She is not in acute distress. Pulmonary:     Effort: Pulmonary effort is normal.  Musculoskeletal:        General: No swelling.  Skin:    General: Skin is warm and dry.     Findings: No rash.  Neurological:     Mental Status: She is alert and oriented to person, place, and time.  Psychiatric:        Mood and Affect: Mood normal.        Behavior: Behavior normal.   Buttock: soft, compressible fullness in the left buttock, nontender to palpation   GU / Detailed Urogynecologic Evaluation:  deferred   Post-Void Residual (PVR) by Bladder Scan: In  order to evaluate bladder emptying, we discussed obtaining a postvoid residual and she agreed to this procedure.  Procedure: The ultrasound unit was placed on the patient's abdomen in the suprapubic region after the patient had voided. A PVR of 13 ml was obtained by bladder scan.  Laboratory Results: POC urine: moderate blood    ASSESSMENT AND PLAN Ms. ThScalfs a [age].o. with:  1. Cyst of peritoneal cavity   2. Urinary frequency     - We discussed that based on MRI imaging in 2014, her issue may be complex and out of the scope of my practice.  - There was  previous discussion based on chart review for referral to Surgical Oncology at an academic center and I feel this would be the best place for her to be evaluated so referral has been placed to [facility].  - She has not had updated pelvic MRI since 2014, so a new MRI has been ordered.   Return as needed  Jaquita Folds, MD

## 2022-04-18 ENCOUNTER — Encounter: Payer: Self-pay | Admitting: *Deleted

## 2022-04-28 ENCOUNTER — Ambulatory Visit (HOSPITAL_COMMUNITY)
Admission: RE | Admit: 2022-04-28 | Discharge: 2022-04-28 | Disposition: A | Discharge status: Home / Self Care | Payer: Medicaid Other | Source: Ambulatory Visit | Attending: Obstetrics and Gynecology | Admitting: Obstetrics and Gynecology

## 2022-04-28 ENCOUNTER — Other Ambulatory Visit: Payer: Self-pay

## 2022-04-28 DIAGNOSIS — D259 Leiomyoma of uterus, unspecified: Secondary | ICD-10-CM | POA: Diagnosis not present

## 2022-04-28 DIAGNOSIS — K668 Other specified disorders of peritoneum: Secondary | ICD-10-CM | POA: Insufficient documentation

## 2022-04-28 MED ORDER — GADOBUTROL 1 MMOL/ML IV SOLN
10.0000 mL | Freq: Once | INTRAVENOUS | Status: AC | PRN
  Administered 2022-04-28: 10 mL via INTRAVENOUS

## 2022-05-04 DIAGNOSIS — R1900 Intra-abdominal and pelvic swelling, mass and lump, unspecified site: Secondary | ICD-10-CM | POA: Diagnosis not present

## 2022-05-05 DIAGNOSIS — I10 Essential (primary) hypertension: Secondary | ICD-10-CM | POA: Diagnosis not present

## 2022-05-05 DIAGNOSIS — L309 Dermatitis, unspecified: Secondary | ICD-10-CM | POA: Diagnosis not present

## 2022-05-05 DIAGNOSIS — N62 Hypertrophy of breast: Secondary | ICD-10-CM | POA: Diagnosis not present

## 2022-05-05 DIAGNOSIS — R7303 Prediabetes: Secondary | ICD-10-CM | POA: Diagnosis not present

## 2022-05-05 DIAGNOSIS — Z0000 Encounter for general adult medical examination without abnormal findings: Secondary | ICD-10-CM | POA: Diagnosis not present

## 2022-05-11 ENCOUNTER — Other Ambulatory Visit: Payer: Self-pay

## 2022-05-11 DIAGNOSIS — D500 Iron deficiency anemia secondary to blood loss (chronic): Secondary | ICD-10-CM

## 2022-05-11 NOTE — Progress Notes (Signed)
Chart Review for orders.

## 2022-05-12 ENCOUNTER — Ambulatory Visit: Payer: Medicaid Other | Admitting: Internal Medicine

## 2022-05-12 ENCOUNTER — Inpatient Hospital Stay: Payer: Medicaid Other | Attending: Internal Medicine

## 2022-05-12 ENCOUNTER — Inpatient Hospital Stay: Payer: Medicaid Other | Admitting: Internal Medicine

## 2022-05-12 ENCOUNTER — Other Ambulatory Visit: Payer: Medicaid Other

## 2022-05-25 ENCOUNTER — Ambulatory Visit
Admission: RE | Admit: 2022-05-25 | Discharge: 2022-05-25 | Disposition: A | Discharge status: Home / Self Care | Payer: Medicaid Other | Source: Ambulatory Visit | Attending: Physician Assistant | Admitting: Physician Assistant

## 2022-05-25 DIAGNOSIS — Z1231 Encounter for screening mammogram for malignant neoplasm of breast: Secondary | ICD-10-CM

## 2022-12-11 ENCOUNTER — Encounter (HOSPITAL_COMMUNITY): Payer: Self-pay

## 2022-12-11 ENCOUNTER — Emergency Department (HOSPITAL_COMMUNITY)
Admission: EM | Admit: 2022-12-11 | Discharge: 2022-12-11 | Disposition: A | Discharge status: Home / Self Care | Payer: Medicaid Other | Attending: Emergency Medicine | Admitting: Emergency Medicine

## 2022-12-11 ENCOUNTER — Other Ambulatory Visit: Payer: Self-pay

## 2022-12-11 ENCOUNTER — Emergency Department (HOSPITAL_COMMUNITY): Payer: Medicaid Other

## 2022-12-11 DIAGNOSIS — R079 Chest pain, unspecified: Secondary | ICD-10-CM | POA: Diagnosis present

## 2022-12-11 DIAGNOSIS — I10 Essential (primary) hypertension: Secondary | ICD-10-CM | POA: Insufficient documentation

## 2022-12-11 DIAGNOSIS — R0602 Shortness of breath: Secondary | ICD-10-CM | POA: Diagnosis not present

## 2022-12-11 DIAGNOSIS — Z79899 Other long term (current) drug therapy: Secondary | ICD-10-CM | POA: Insufficient documentation

## 2022-12-11 LAB — CBC
HCT: 38.5 % (ref 36.0–46.0)
Hemoglobin: 11.8 g/dL — ABNORMAL LOW (ref 12.0–15.0)
MCH: 20.8 pg — ABNORMAL LOW (ref 26.0–34.0)
MCHC: 30.6 g/dL (ref 30.0–36.0)
MCV: 67.8 fL — ABNORMAL LOW (ref 80.0–100.0)
Platelets: 322 10*3/uL (ref 150–400)
RBC: 5.68 MIL/uL — ABNORMAL HIGH (ref 3.87–5.11)
RDW: 15.3 % (ref 11.5–15.5)
WBC: 4.7 10*3/uL (ref 4.0–10.5)
nRBC: 0 % (ref 0.0–0.2)

## 2022-12-11 LAB — BASIC METABOLIC PANEL
Anion gap: 10 (ref 5–15)
BUN: 9 mg/dL (ref 6–20)
CO2: 26 mmol/L (ref 22–32)
Calcium: 9.7 mg/dL (ref 8.9–10.3)
Chloride: 105 mmol/L (ref 98–111)
Creatinine, Ser: 0.67 mg/dL (ref 0.44–1.00)
GFR, Estimated: >60 mL/min (ref >60)
Glucose, Bld: 109 mg/dL — ABNORMAL HIGH (ref 70–99)
Potassium: 3.9 mmol/L (ref 3.5–5.1)
Sodium: 141 mmol/L (ref 135–145)

## 2022-12-11 LAB — D-DIMER, QUANTITATIVE: D-Dimer, Quant: 0.35 ug{FEU}/mL (ref 0.00–0.50)

## 2022-12-11 LAB — TROPONIN I (HIGH SENSITIVITY)
Troponin I (High Sensitivity): 2 ng/L (ref <18)
Troponin I (High Sensitivity): 4 ng/L (ref <18)

## 2022-12-11 LAB — HCG, SERUM, QUALITATIVE: Preg, Serum: NEGATIVE

## 2022-12-11 LAB — BRAIN NATRIURETIC PEPTIDE: B Natriuretic Peptide: 10.5 pg/mL (ref 0.0–100.0)

## 2022-12-11 MED ORDER — METHYLPREDNISOLONE SODIUM SUCC 125 MG IJ SOLR
125.0000 mg | Freq: Once | INTRAMUSCULAR | Status: AC
  Administered 2022-12-11: 125 mg via INTRAVENOUS
  Filled 2022-12-11: qty 2

## 2022-12-11 MED ORDER — ACETAMINOPHEN 325 MG PO TABS
650.0000 mg | ORAL_TABLET | Freq: Once | ORAL | Status: AC
  Administered 2022-12-11: 650 mg via ORAL
  Filled 2022-12-11: qty 2

## 2022-12-11 MED ORDER — PREDNISONE 20 MG PO TABS: 40.0000 mg | ORAL_TABLET | Freq: Every day | ORAL | 0 refills | Status: AC

## 2022-12-11 MED ORDER — IPRATROPIUM-ALBUTEROL 0.5-2.5 (3) MG/3ML IN SOLN
3.0000 mL | Freq: Once | RESPIRATORY_TRACT | Status: AC
  Administered 2022-12-11: 3 mL via RESPIRATORY_TRACT
  Filled 2022-12-11: qty 3

## 2022-12-11 MED ORDER — ALBUTEROL SULFATE HFA 108 (90 BASE) MCG/ACT IN AERS: 1.0000 | INHALATION_SPRAY | Freq: Four times a day (QID) | RESPIRATORY_TRACT | 0 refills | Status: AC | PRN

## 2022-12-11 NOTE — ED Provider Notes (Signed)
[place] EMERGENCY DEPARTMENT AT [facility] Provider Note   CSN: [id number] Arrival date & time: 12/11/22  1107     History  Chief Complaint  Patient presents with   Chest Pain    Brittney Sandoval is a 42 y.o. female with a past medical history significant for obesity, hypertension, history of PEs after C-section in 2017 not on any anticoagulants, iron deficiency anemia, and sickle cell trait who presents to the ED due to left-sided chest pain associated with shortness of breath x 1 day.  Patient states shortness of breath associated with wheezing.  No history of COPD.  Has a history of childhood asthma however, no recent exacerbations.  Patient states symptoms started after bringing her dog to the vet where she was around a cat.  No known allergies to animals.  Patient states she is having a difficult time taking a "deep breath".  Admits to intermittent lower extremity edema when she typically sits for long periods.  Notes lower extremity edema typically improves when lying down.  No recent surgeries or long immobilizations.  Admits to nausea.  No vomiting or diarrhea.  No fever or chills.  History obtained from patient and past medical records. No interpreter used during encounter.       Home Medications Prior to Admission medications   Medication Sig Start Date End Date Taking? Authorizing Provider  albuterol (VENTOLIN HFA) 108 (90 Base) MCG/ACT inhaler Inhale 1-2 puffs into the lungs every 6 (six) hours as needed for wheezing or shortness of breath. 12/11/22  Yes Anikka Marsan C, PA-C  amLODipine (NORVASC) 10 MG tablet TAKE 1 TABLET BY MOUTH DAILY Patient taking differently: Take 10 mg by mouth daily. 03/27/20  Yes Lewayne Bunting, MD  Cholecalciferol (VITAMIN D3) 1.25 MG (50000 UT) CAPS Take 1 capsule by mouth once a week. 09/15/22  Yes [provider]  ferrous sulfate 325 (65 FE) MG tablet Take 1 tablet (325 mg total) by mouth every other day.  11/22/21  Yes Osborn Coho, MD  hydrochlorothiazide (HYDRODIURIL) 12.5 MG tablet Take 12.5 mg by mouth daily. 02/26/20  Yes [provider]  Multiple Vitamins-Minerals (MULTIVITAL PO) Take 1 tablet by mouth daily.   Yes [provider]  predniSONE (DELTASONE) 20 MG tablet Take 2 tablets (40 mg total) by mouth daily for 5 days. 12/11/22 12/16/22 Yes Davon Abdelaziz, Merla Riches, PA-C  triamcinolone ointment (KENALOG) 0.1 % Apply 1 application  topically daily as needed (break outs). 09/20/19  Yes [provider]  ibuprofen (ADVIL) 600 MG tablet Take 1 tablet (600 mg total) by mouth every 6 (six) hours as needed for moderate pain. Patient not taking: Reported on 12/11/2022 11/22/21   Osborn Coho, MD  omeprazole (PRILOSEC) 20 MG capsule Take 1 capsule (20 mg total) by mouth daily. 04/08/20 05/08/20  Carroll Sage, PA-C  oxyCODONE (ROXICODONE) 5 MG immediate release tablet Take 1 tablet (5 mg total) by mouth every 6 (six) hours as needed for severe pain. Patient not taking: Reported on 12/11/2022 11/22/21   Osborn Coho, MD  Semaglutide, 1 MG/DOSE, (OZEMPIC, 1 MG/DOSE,) 4 MG/3ML SOPN Inject 1 mg into the skin once a week. Patient not taking: Reported on 12/11/2022 01/28/22   [provider]      Allergies    Patient has no known allergies.    Review of Systems   Review of Systems  Constitutional:  Negative for chills and fever.  Respiratory:  Positive for shortness of breath.   Cardiovascular:  Positive for chest pain and leg swelling.  Gastrointestinal:  Positive for nausea. Negative for abdominal pain, diarrhea and vomiting.    Physical Exam Updated Vital Signs BP (!) 155/89 (BP Location: Right Arm)   Pulse 94   Temp 98 F (36.7 C) (Oral)   Resp 16   Ht 5\' 4"  (1.626 m)   Wt 100.2 kg   SpO2 100%   BMI 37.93 kg/m  Physical Exam Vitals and nursing note reviewed.  Constitutional:      General: She is not in acute distress.    Appearance: She is  not ill-appearing.  HENT:     Head: Normocephalic.  Eyes:     Pupils: Pupils are equal, round, and reactive to light.  Cardiovascular:     Rate and Rhythm: Normal rate and regular rhythm.     Pulses: Normal pulses.     Heart sounds: Normal heart sounds. No murmur heard.    No friction rub. No gallop.  Pulmonary:     Effort: Pulmonary effort is normal.     Breath sounds: Wheezing present.  Abdominal:     General: Abdomen is flat. There is no distension.     Palpations: Abdomen is soft.     Tenderness: There is no abdominal tenderness. There is no guarding or rebound.  Musculoskeletal:        General: Normal range of motion.     Cervical back: Neck supple.     Comments: No lower extremity edema bilaterally  Skin:    General: Skin is warm and dry.  Neurological:     General: No focal deficit present.     Mental Status: She is alert.  Psychiatric:        Mood and Affect: Mood normal.        Behavior: Behavior normal.     ED Results / Procedures / Treatments   Labs (all labs ordered are listed, but only abnormal results are displayed) Labs Reviewed  BASIC METABOLIC PANEL - Abnormal; Notable for the following components:      Result Value   Glucose, Bld 109 (*)    All other components within normal limits  CBC - Abnormal; Notable for the following components:   RBC 5.68 (*)    Hemoglobin 11.8 (*)    MCV 67.8 (*)    MCH 20.8 (*)    All other components within normal limits  HCG, SERUM, QUALITATIVE  D-DIMER, QUANTITATIVE  BRAIN NATRIURETIC PEPTIDE  TROPONIN I (HIGH SENSITIVITY)  TROPONIN I (HIGH SENSITIVITY)    EKG None  Radiology DG Chest 2 View  Result Date: 12/11/2022 CLINICAL DATA:  Left-sided chest pain radiating to her left shoulder. EXAM: CHEST - 2 VIEW COMPARISON:  CT chest and chest x-ray dated April 08, 2020. FINDINGS: The heart size and mediastinal contours are within normal limits. Both lungs are clear. The visualized skeletal structures are  unremarkable. IMPRESSION: No active cardiopulmonary disease. Electronically Signed   By: Obie Dredge M.D.   On: 12/11/2022 12:36    Procedures Procedures    Medications Ordered in ED Medications  ipratropium-albuterol (DUONEB) 0.5-2.5 (3) MG/3ML nebulizer solution 3 mL (3 mLs Nebulization Given 12/11/22 1218)  methylPREDNISolone sodium succinate (SOLU-MEDROL) 125 mg/2 mL injection 125 mg (125 mg Intravenous Given 12/11/22 1218)  acetaminophen (TYLENOL) tablet 650 mg (650 mg Oral Given 12/11/22 1440)    ED Course/ Medical Decision Making/ A&P Clinical Course as of 12/11/22 1602  Sun Dec 11, 2022  1218 Preg, Serum: NEGATIVE [CA]  1218 Hemoglobin(!): 11.8 [CA]  1241 Troponin I (High Sensitivity): 2 [CA]  1306 D-Dimer, Quant: 0.35 [CA]  1412 Reassessed patient at bedside.  Patient notes improvement to shortness of breath.  Lungs clear to auscultation bilaterally.  No longer wheezing.  Patient does admit to a headache.  Tylenol given. [CA]    Clinical Course User Index [CA] Mannie Stabile, PA-C                                 Medical Decision Making Amount and/or Complexity of Data Reviewed Labs: ordered. Decision-making details documented in ED Course. Radiology: ordered and independent interpretation performed. Decision-making details documented in ED Course. ECG/medicine tests: ordered and independent interpretation performed. Decision-making details documented in ED Course.  Risk OTC drugs. Prescription drug management.   This patient presents to the ED for concern of SOB/CP, this involves an extensive number of treatment options, and is a complaint that carries with it a high risk of complications and morbidity.  The differential diagnosis includes ACS, PE, pneumonia, asthma exacerbation, etc  42 year old female presents to the ED due to left-sided chest pain associated with shortness of breath.  Previous history of PE after C-section in 2017.  Not on any  anticoagulants.  Childhood history of asthma.  No tobacco use.  Upon arrival, stable vitals.  Patient in no acute distress.  Slight expiratory wheeze on exam.  No lower extremity edema.  Routine labs ordered.  Troponin rule out ACS.  Chest x-ray to rule out evidence of pneumonia.  D-dimer given history of PE to rule out PE.  No evidence of DVT on exam.  DuoNeb treatment and Solu-Medrol given.  CBC reassuring.  No leukocytosis.  Hemoglobin 11.8.  BMP reassuring.  Normal renal function.  No major electrolyte derangements.  BNP normal.  Low suspicion for CHF.  D-dimer normal.  Low suspicion for PE.  Pregnancy test negative.  Chest x-ray personally viewed and interpreted negative for signs of pneumonia, pneumothorax, or widened mediastinum.  Agree with radiology interpretation. Troponin x2 normal. EKG NSR. Nonspecific t-wave abnormalities. Low suspicion for ACS. Presentation non-concerning for aortic dissection.  Reassessed patient.  See note above.  Suspect symptoms related to possible asthma exacerbation.  Will discharge patient with prednisone and albuterol.  No evidence of respiratory distress.  Patient able to ambulate in the ED and maintain O2 saturation above 95% without difficulty.  Patient stable for discharge. Strict ED precautions discussed with patient. Patient states understanding and agrees to plan. Patient discharged home in no acute distress and stable vitals  Co morbidities that complicate the patient evaluation  Obesity, history of PE Cardiac Monitoring: / EKG:  The patient was maintained on a cardiac monitor.  I personally viewed and interpreted the cardiac monitored which showed an underlying rhythm of: NSR         Final Clinical Impression(s) / ED Diagnoses Final diagnoses:  Nonspecific chest pain  Shortness of breath    Rx / DC Orders ED Discharge Orders          Ordered    predniSONE (DELTASONE) 20 MG tablet  Daily        12/11/22 1550    albuterol (VENTOLIN HFA)  108 (90 Base) MCG/ACT inhaler  Every 6 hours PRN        12/11/22 1550              Mannie Stabile, [place] 12/11/22 1628  Gerhard Munch, MD 12/11/22 2001

## 2022-12-11 NOTE — ED Notes (Signed)
Pt ambulatory to waiting room. Pt verbalized understanding of discharge instructions.

## 2022-12-11 NOTE — ED Triage Notes (Signed)
Pt arrived POV from home c/o left sided CP that radiates to her left shoulder and some nausea. Pt also states she has had wheezing going on since she took her Dog to the vet yesterday, pt states she has never known herself to be allergic to pets or cats but the only thing she can think of is that a lady came in with a cat and she has felt this way since.

## 2022-12-11 NOTE — Discharge Instructions (Addendum)
It was a pleasure taking care of you today.  As discussed, your workup was reassuring.  I am sending you home with steroids and albuterol inhaler.  Please follow-up with PCP for recheck early this week.  Return to the ER for any new or worsening symptoms.

## 2023-01-05 NOTE — Progress Notes (Deleted)
HPI: Follow-up hypertension and palpitations. Patient delivered by C-section on 11/25/2015. She was found to have a pulmonary embolus in October 2017 and there was note of DVT on lower extremity venous Dopplers. She was treated with xarelto. Echocardiogram December 2017 showed normal LV systolic function. Stress echocardiogram January 2019 normal. Monitor May 2021 showed transient Mobitz 1 second-degree AV block in the early morning hours.  Cardiac CTA September 2021 showed no coronary disease and calcium score 0.  CTA February 2022 showed no pulmonary embolus.  Echocardiogram January 2023 showed normal LV function, mild right ventricular enlargement, mild left atrial enlargement.  Since last seen,   Current Outpatient Medications  Medication Sig Dispense Refill   albuterol (VENTOLIN HFA) 108 (90 Base) MCG/ACT inhaler Inhale 1-2 puffs into the lungs every 6 (six) hours as needed for wheezing or shortness of breath. 18 g 0   amLODipine (NORVASC) 10 MG tablet TAKE 1 TABLET BY MOUTH DAILY (Patient taking differently: Take 10 mg by mouth daily.) 90 tablet 3   Cholecalciferol (VITAMIN D3) 1.25 MG (50000 UT) CAPS Take 1 capsule by mouth once a week.     ferrous sulfate 325 (65 FE) MG tablet Take 1 tablet (325 mg total) by mouth every other day. 30 tablet 1   hydrochlorothiazide (HYDRODIURIL) 12.5 MG tablet Take 12.5 mg by mouth daily.     ibuprofen (ADVIL) 600 MG tablet Take 1 tablet (600 mg total) by mouth every 6 (six) hours as needed for moderate pain. (Patient not taking: Reported on 12/11/2022) 30 tablet 1   Multiple Vitamins-Minerals (MULTIVITAL PO) Take 1 tablet by mouth daily.     omeprazole (PRILOSEC) 20 MG capsule Take 1 capsule (20 mg total) by mouth daily. 30 capsule 0   oxyCODONE (ROXICODONE) 5 MG immediate release tablet Take 1 tablet (5 mg total) by mouth every 6 (six) hours as needed for severe pain. (Patient not taking: Reported on 12/11/2022) 10 tablet 0   Semaglutide, 1 MG/DOSE,  (OZEMPIC, 1 MG/DOSE,) 4 MG/3ML SOPN Inject 1 mg into the skin once a week. (Patient not taking: Reported on 12/11/2022)     triamcinolone ointment (KENALOG) 0.1 % Apply 1 application  topically daily as needed (break outs).     No current facility-administered medications for this visit.     Past Medical History:  Diagnosis Date   Anemia    DVT (deep venous thrombosis) (HCC)    a. 11/2015 U/S: Left Peroneal DVT;  b. 02/2016 U/S: No DVT bilat.   Essential hypertension    a. 01/2016 Echo: EF 60-65%, no rwma, triv MR/TR, PASP .   Fibroid    GERD (gastroesophageal reflux disease)    Pulmonary emboli (HCC)    a. 11/2015 CTA Chest: PE involving the segmental and subsegmental branches of the RML & RLL-->Xarelto.    Past Surgical History:  Procedure Laterality Date   CESAREAN SECTION N/A 04/04/2013   Procedure: CESAREAN SECTION;  Surgeon: Allie Bossier, MD;  Location: [facility];  Service: Obstetrics;  Laterality: N/A;   CESAREAN SECTION N/A 11/25/2015   Procedure: CESAREAN SECTION;  Surgeon: Osborn Coho, MD;  Location: [facility] BIRTHING SUITES;  Service: Obstetrics;  Laterality: N/A;   DILITATION & CURRETTAGE/HYSTROSCOPY WITH NOVASURE ABLATION N/A 11/22/2021   Procedure: DILATATION & CURETTAGE/HYSTEROSCOPY WITH HYDROTHERMAL  ABLATION;  Surgeon: Osborn Coho, MD;  Location: [facility];  Service: Gynecology;  Laterality: N/A;   LAPAROSCOPIC CHOLECYSTECTOMY  2010   [place], [place]   TONSILLECTOMY  Social History   Socioeconomic History   Marital status: Single    Spouse name: Not on file   Number of children: Not on file   Years of education: Not on file   Highest education level: Not on file  Occupational History   Not on file  Tobacco Use   Smoking status: Never   Smokeless tobacco: Never  Vaping Use   Vaping status: Never Used  Substance and Sexual Activity   Alcohol use: Yes    Comment: occassionally   Drug use: No   Sexual activity: Not  Currently    Birth control/protection: None  Other Topics Concern   Not on file  Social History Narrative   Lives in [place].  Relocated to [place] a few years ago.   Social Determinants of Health   Financial Resource Strain: Not on file  Food Insecurity: Unknown (11/07/2022)   Received from [facility]   Hunger Vital Sign    Worried About Running Out of Food in the Last Year: Patient declined to answer    Ran Out of Food in the Last Year: Patient declined to answer  Transportation Needs: Not on file (11/07/2022)  Physical Activity: Not on file  Stress: Not on file  Social Connections: Not on file  Intimate Partner Violence: Not on file    Family History  Problem Relation Age of Onset   Diabetes Mother    Hypertension Mother    Kidney disease Mother    Heart disease Mother    Hypertension Father     ROS: no fevers or chills, productive cough, hemoptysis, dysphasia, odynophagia, melena, hematochezia, dysuria, hematuria, rash, seizure activity, orthopnea, PND, pedal edema, claudication. Remaining systems are negative.  Physical Exam: Well-developed well-nourished in no acute distress.  Skin is warm and dry.  HEENT is normal.  Neck is supple.  Chest is clear to auscultation with normal expansion.  Cardiovascular exam is regular rate and rhythm.  Abdominal exam nontender or distended. No masses palpated. Extremities show no edema. neuro grossly intact  ECG- personally reviewed  A/P  1 palpitations-no recent symptoms.  Previous monitor showed no significant arrhythmias.  2 history of chest pain-  3 hypertension-blood pressure controlled.  Continue present medications.  Olga Millers, MD

## 2023-01-19 ENCOUNTER — Ambulatory Visit: Payer: Medicaid Other | Attending: Cardiology | Admitting: Cardiology

## 2023-01-20 ENCOUNTER — Encounter: Payer: Self-pay | Admitting: Cardiology

## 2023-06-20 NOTE — Progress Notes (Deleted)
 HPI: Follow-up chest pain, hypertension and palpitations. Patient delivered by C-section on 11/25/2015. She was found to have a pulmonary embolus in October 2017 and there was note of DVT on lower extremity venous Dopplers. She was treated with xarelto . Stress echocardiogram January 2019 normal. Monitor May 2021 showed transient Mobitz 1 second-degree AV block in the early morning hours.  Cardiac CTA September 2021 showed no coronary disease and calcium score 0.  CTA February 2022 showed no pulmonary embolus.  Echo 1/23 shows normal LV function; mild right ventricular enlargement; mild left atrial enlargement.  Since last seen,    Current Outpatient Medications  Medication Sig Dispense Refill   albuterol  (VENTOLIN  HFA) 108 (90 Base) MCG/ACT inhaler Inhale 1-2 puffs into the lungs every 6 (six) hours as needed for wheezing or shortness of breath. 18 g 0   amLODipine  (NORVASC ) 10 MG tablet TAKE 1 TABLET BY MOUTH DAILY (Patient taking differently: Take 10 mg by mouth daily.) 90 tablet 3   Cholecalciferol (VITAMIN D3) 1.25 MG (50000 UT) CAPS Take 1 capsule by mouth once a week.     ferrous sulfate  325 (65 FE) MG tablet Take 1 tablet (325 mg total) by mouth every other day. 30 tablet 1   hydrochlorothiazide  (HYDRODIURIL ) 12.5 MG tablet Take 12.5 mg by mouth daily.     ibuprofen  (ADVIL ) 600 MG tablet Take 1 tablet (600 mg total) by mouth every 6 (six) hours as needed for moderate pain. (Patient not taking: Reported on 12/11/2022) 30 tablet 1   Multiple Vitamins-Minerals (MULTIVITAL PO) Take 1 tablet by mouth daily.     omeprazole  (PRILOSEC) 20 MG capsule Take 1 capsule (20 mg total) by mouth daily. 30 capsule 0   oxyCODONE  (ROXICODONE ) 5 MG immediate release tablet Take 1 tablet (5 mg total) by mouth every 6 (six) hours as needed for severe pain. (Patient not taking: Reported on 12/11/2022) 10 tablet 0   Semaglutide, 1 MG/DOSE, (OZEMPIC, 1 MG/DOSE,) 4 MG/3ML SOPN Inject 1 mg into the skin once a week.  (Patient not taking: Reported on 12/11/2022)     triamcinolone ointment (KENALOG) 0.1 % Apply 1 application  topically daily as needed (break outs).     No current facility-administered medications for this visit.     Past Medical History:  Diagnosis Date   Anemia    DVT (deep venous thrombosis) (HCC)    a. 11/2015 U/S: Left Peroneal DVT;  b. 02/2016 U/S: No DVT bilat.   Essential hypertension    a. 01/2016 Echo: EF 60-65%, no rwma, triv MR/TR, PASP .   Fibroid    GERD (gastroesophageal reflux disease)    Pulmonary emboli (HCC)    a. 11/2015 CTA Chest: PE involving the segmental and subsegmental branches of the RML & RLL-->Xarelto .    Past Surgical History:  Procedure Laterality Date   CESAREAN SECTION N/A 04/04/2013   Procedure: CESAREAN SECTION;  Surgeon: Ana Balling, MD;  Location: [facility];  Service: Obstetrics;  Laterality: N/A;   CESAREAN SECTION N/A 11/25/2015   Procedure: CESAREAN SECTION;  Surgeon: Renea Carrion, MD;  Location: [facility] BIRTHING SUITES;  Service: Obstetrics;  Laterality: N/A;   DILITATION & CURRETTAGE/HYSTROSCOPY WITH NOVASURE ABLATION N/A 11/22/2021   Procedure: DILATATION & CURETTAGE/HYSTEROSCOPY WITH HYDROTHERMAL  ABLATION;  Surgeon: Renea Carrion, MD;  Location: [facility];  Service: Gynecology;  Laterality: N/A;   LAPAROSCOPIC CHOLECYSTECTOMY  2010   [place], [place]    TONSILLECTOMY      Social History  Socioeconomic History   Marital status: Single    Spouse name: Not on file   Number of children: Not on file   Years of education: Not on file   Highest education level: Not on file  Occupational History   Not on file  Tobacco Use   Smoking status: Never   Smokeless tobacco: Never  Vaping Use   Vaping status: Never Used  Substance and Sexual Activity   Alcohol use: Yes    Comment: occassionally   Drug use: No   Sexual activity: Not Currently    Birth control/protection: None  Other Topics Concern   Not on  file  Social History Narrative   Lives in [place].  Relocated to [place] a few years ago.   Social Drivers of Corporate investment banker Strain: Not on file  Food Insecurity: Low Risk  (05/08/2023)   Received from [facility]   Hunger Vital Sign    Worried About Running Out of Food in the Last Year: Never true    Ran Out of Food in the Last Year: Never true  Transportation Needs: No Transportation Needs (05/08/2023)   Received from [facility]    In the past 12 months, has lack of reliable transportation kept you from medical appointments, meetings, work or from getting things needed for daily living? : No  Physical Activity: Not on file  Stress: Not on file  Social Connections: Not on file  Intimate Partner Violence: Not on file    Family History  Problem Relation Age of Onset   Diabetes Mother    Hypertension Mother    Kidney disease Mother    Heart disease Mother    Hypertension Father     ROS: no fevers or chills, productive cough, hemoptysis, dysphasia, odynophagia, melena, hematochezia, dysuria, hematuria, rash, seizure activity, orthopnea, PND, pedal edema, claudication. Remaining systems are negative.  Physical Exam: Well-developed well-nourished in no acute distress.  Skin is warm and dry.  HEENT is normal.  Neck is supple.  Chest is clear to auscultation with normal expansion.  Cardiovascular exam is regular rate and rhythm.  Abdominal exam nontender or distended. No masses palpated. Extremities show no edema. neuro grossly intact  ECG- personally reviewed  A/P  1 chest pain-  2 palpitations-  3 hypertension-patient's blood pressure is controlled.  Continue present medications.  Alexandria Angel, MD

## 2023-06-21 ENCOUNTER — Ambulatory Visit: Admitting: Cardiology

## 2023-07-17 ENCOUNTER — Encounter (HOSPITAL_COMMUNITY): Payer: Self-pay | Admitting: Emergency Medicine

## 2023-07-17 ENCOUNTER — Emergency Department (HOSPITAL_COMMUNITY)
Admission: EM | Admit: 2023-07-17 | Discharge: 2023-07-17 | Discharge status: Against Medical Advice | Attending: Emergency Medicine | Admitting: Emergency Medicine

## 2023-07-17 ENCOUNTER — Other Ambulatory Visit: Payer: Self-pay

## 2023-07-17 ENCOUNTER — Emergency Department (HOSPITAL_COMMUNITY)

## 2023-07-17 ENCOUNTER — Emergency Department (HOSPITAL_COMMUNITY)
Admission: EM | Admit: 2023-07-17 | Discharge: 2023-07-17 | Disposition: A | Discharge status: Home / Self Care | Source: Home / Self Care | Attending: Emergency Medicine | Admitting: Emergency Medicine

## 2023-07-17 DIAGNOSIS — Z5321 Procedure and treatment not carried out due to patient leaving prior to being seen by health care provider: Secondary | ICD-10-CM | POA: Insufficient documentation

## 2023-07-17 DIAGNOSIS — R1031 Right lower quadrant pain: Secondary | ICD-10-CM | POA: Diagnosis present

## 2023-07-17 DIAGNOSIS — N946 Dysmenorrhea, unspecified: Secondary | ICD-10-CM

## 2023-07-17 DIAGNOSIS — R112 Nausea with vomiting, unspecified: Secondary | ICD-10-CM | POA: Diagnosis not present

## 2023-07-17 LAB — URINALYSIS, ROUTINE W REFLEX MICROSCOPIC
Bacteria, UA: NONE SEEN
Bilirubin Urine: NEGATIVE
Bilirubin Urine: NEGATIVE
Glucose, UA: NEGATIVE mg/dL
Glucose, UA: NEGATIVE mg/dL
Ketones, ur: NEGATIVE mg/dL
Ketones, ur: 5 mg/dL — AB
Leukocytes,Ua: NEGATIVE
Leukocytes,Ua: NEGATIVE
Nitrite: NEGATIVE
Nitrite: NEGATIVE
Protein, ur: NEGATIVE mg/dL
Protein, ur: NEGATIVE mg/dL
Specific Gravity, Urine: 1.014 (ref 1.005–1.030)
Specific Gravity, Urine: 1.016 (ref 1.005–1.030)
pH: 8 (ref 5.0–8.0)
pH: 7 (ref 5.0–8.0)

## 2023-07-17 LAB — COMPREHENSIVE METABOLIC PANEL WITH GFR
ALT: 31 U/L (ref 0–44)
AST: 38 U/L (ref 15–41)
Albumin: 3.9 g/dL (ref 3.5–5.0)
Alkaline Phosphatase: 60 U/L (ref 38–126)
Anion gap: 8 (ref 5–15)
BUN: 10 mg/dL (ref 6–20)
CO2: 21 mmol/L — ABNORMAL LOW (ref 22–32)
Calcium: 9.5 mg/dL (ref 8.9–10.3)
Chloride: 108 mmol/L (ref 98–111)
Creatinine, Ser: 0.8 mg/dL (ref 0.44–1.00)
GFR, Estimated: >60 mL/min (ref >60)
Glucose, Bld: 121 mg/dL — ABNORMAL HIGH (ref 70–99)
Potassium: 3.7 mmol/L (ref 3.5–5.1)
Sodium: 137 mmol/L (ref 135–145)
Total Bilirubin: 0.7 mg/dL (ref 0.0–1.2)
Total Protein: 7.9 g/dL (ref 6.5–8.1)

## 2023-07-17 LAB — CBC
HCT: 38.3 % (ref 36.0–46.0)
Hemoglobin: 12.4 g/dL (ref 12.0–15.0)
MCH: 21.9 pg — ABNORMAL LOW (ref 26.0–34.0)
MCHC: 32.4 g/dL (ref 30.0–36.0)
MCV: 67.8 fL — ABNORMAL LOW (ref 80.0–100.0)
Platelets: 258 10*3/uL (ref 150–400)
RBC: 5.65 MIL/uL — ABNORMAL HIGH (ref 3.87–5.11)
RDW: 15.1 % (ref 11.5–15.5)
WBC: 6.2 10*3/uL (ref 4.0–10.5)
nRBC: 0 % (ref 0.0–0.2)

## 2023-07-17 LAB — LIPASE, BLOOD: Lipase: 34 U/L (ref 11–51)

## 2023-07-17 LAB — HCG, SERUM, QUALITATIVE: Preg, Serum: NEGATIVE

## 2023-07-17 MED ORDER — ONDANSETRON 4 MG PO TBDP: 4.0000 mg | ORAL_TABLET | Freq: Three times a day (TID) | ORAL | 0 refills | Status: AC | PRN

## 2023-07-17 MED ORDER — ACETAMINOPHEN 500 MG PO TABS
1000.0000 mg | ORAL_TABLET | Freq: Once | ORAL | Status: AC
  Administered 2023-07-17: 1000 mg via ORAL
  Filled 2023-07-17: qty 2

## 2023-07-17 MED ORDER — ONDANSETRON 4 MG PO TBDP
4.0000 mg | ORAL_TABLET | Freq: Once | ORAL | Status: AC
  Administered 2023-07-17: 4 mg via ORAL
  Filled 2023-07-17: qty 1

## 2023-07-17 MED ORDER — IBUPROFEN 800 MG PO TABS
800.0000 mg | ORAL_TABLET | Freq: Once | ORAL | Status: AC
  Administered 2023-07-17: 800 mg via ORAL
  Filled 2023-07-17: qty 1

## 2023-07-17 MED ORDER — ACETAMINOPHEN 500 MG PO TABS: 1000.0000 mg | ORAL_TABLET | Freq: Three times a day (TID) | ORAL | 0 refills | Status: AC | PRN

## 2023-07-17 MED ORDER — IBUPROFEN 800 MG PO TABS: 800.0000 mg | ORAL_TABLET | Freq: Three times a day (TID) | ORAL | 0 refills | Status: AC

## 2023-07-17 NOTE — Discharge Instructions (Addendum)
 Today you were seen for painful menstrual periods.  You may alternate taking Tylenol  (1,000mg ) and ibuprofen  (800mg ) every 4 hours for pain.  Please follow-up with your OB/GYN as soon as possible for further evaluation and workup.  Thank you for letting us  treat you today. After reviewing your labs and imaging, I feel you are safe to go home. Please follow up with your PCP in the next several days and provide them with your records from this visit. Return to the Emergency Room if pain becomes severe or symptoms worsen.

## 2023-07-17 NOTE — ED Provider Notes (Addendum)
 [place] EMERGENCY DEPARTMENT AT [facility] Provider Note   CSN: [id number] Arrival date & time: 07/17/23  0546     History  Chief Complaint  Patient presents with   Flank Pain    Brittney Sandoval is a 43 y.o. female presents today for right sided pelvic pain that began yesterday.  Patient states that she had similar pain last month when on her menstrual cycle and began her menstrual cycle yesterday.  Patient also reports episode of emesis and nausea.  Patient denies fever, chills, diarrhea, urinary symptoms, abnormal vaginal discharge, or heavy menses.  Patient states that she did previously have a uterine ablation, but normally does not have menstrual cramping/pain like this.  Patient is concern for endometriosis.  Patient originally went to [facility], but left due to wait.   Flank Pain       Home Medications Prior to Admission medications   Medication Sig Start Date End Date Taking? Authorizing Provider  albuterol  (VENTOLIN  HFA) 108 (90 Base) MCG/ACT inhaler Inhale 1-2 puffs into the lungs every 6 (six) hours as needed for wheezing or shortness of breath. 12/11/22   Aberman, Caroline C, PA-C  amLODipine  (NORVASC ) 10 MG tablet TAKE 1 TABLET BY MOUTH DAILY Patient taking differently: Take 10 mg by mouth daily. 03/27/20   Lenise Quince, MD  Cholecalciferol (VITAMIN D3) 1.25 MG (50000 UT) CAPS Take 1 capsule by mouth once a week. 09/15/22   [provider]  ferrous sulfate  325 (65 FE) MG tablet Take 1 tablet (325 mg total) by mouth every other day. 11/22/21   Renea Carrion, MD  hydrochlorothiazide  (HYDRODIURIL ) 12.5 MG tablet Take 12.5 mg by mouth daily. 02/26/20   [provider]  ibuprofen  (ADVIL ) 600 MG tablet Take 1 tablet (600 mg total) by mouth every 6 (six) hours as needed for moderate pain. Patient not taking: Reported on 12/11/2022 11/22/21   Renea Carrion, MD  Multiple Vitamins-Minerals (MULTIVITAL PO) Take 1 tablet by mouth  daily.    [provider]  omeprazole  (PRILOSEC) 20 MG capsule Take 1 capsule (20 mg total) by mouth daily. 04/08/20 05/08/20  Volney Grumbles, PA-C  oxyCODONE  (ROXICODONE ) 5 MG immediate release tablet Take 1 tablet (5 mg total) by mouth every 6 (six) hours as needed for severe pain. Patient not taking: Reported on 12/11/2022 11/22/21   Renea Carrion, MD  Semaglutide, 1 MG/DOSE, (OZEMPIC, 1 MG/DOSE,) 4 MG/3ML SOPN Inject 1 mg into the skin once a week. Patient not taking: Reported on 12/11/2022 01/28/22   [provider]  triamcinolone ointment (KENALOG) 0.1 % Apply 1 application  topically daily as needed (break outs). 09/20/19   [provider]      Allergies    Patient has no known allergies.    Review of Systems   Review of Systems  Genitourinary:  Positive for pelvic pain.    Physical Exam Updated Vital Signs BP (!) 169/89 (BP Location: Left Arm)   Pulse 80   Temp 97.9 F (36.6 C) (Oral)   Resp 19   LMP 07/16/2023   SpO2 100%  Physical Exam Vitals and nursing note reviewed.  Constitutional:      General: She is not in acute distress.    Appearance: Normal appearance. She is well-developed. She is not ill-appearing.  HENT:     Head: Normocephalic and atraumatic.     Nose: Nose normal.  Eyes:     Extraocular Movements: Extraocular movements intact.     Conjunctiva/sclera: Conjunctivae  normal.  Cardiovascular:     Rate and Rhythm: Normal rate and regular rhythm.     Pulses: Normal pulses.     Heart sounds: Normal heart sounds.  Pulmonary:     Effort: Pulmonary effort is normal. No respiratory distress.     Breath sounds: Normal breath sounds.  Abdominal:     Palpations: Abdomen is soft.     Comments: Mild tenderness to palpation of right pelvic region.  C-section scar noted on exam.  Musculoskeletal:        General: No swelling.     Cervical back: Neck supple.  Skin:    General: Skin is warm and dry.     Capillary Refill: Capillary  refill takes less than 2 seconds.  Neurological:     General: No focal deficit present.     Mental Status: She is alert.  Psychiatric:        Mood and Affect: Mood normal.     ED Results / Procedures / Treatments   Labs (all labs ordered are listed, but only abnormal results are displayed) Labs Reviewed  URINALYSIS, ROUTINE W REFLEX MICROSCOPIC - Abnormal; Notable for the following components:      Result Value   Hgb urine dipstick MODERATE (*)    Ketones, ur 5 (*)    All other components within normal limits    EKG None  Radiology US  Pelvis Complete Result Date: 07/17/2023 CLINICAL DATA:  R sided pelvic pain. EXAM: TRANSABDOMINAL AND TRANSVAGINAL ULTRASOUND OF PELVIS DOPPLER ULTRASOUND OF OVARIES TECHNIQUE: Both transabdominal and transvaginal ultrasound examinations of the pelvis were performed. Transabdominal technique was performed for global imaging of the pelvis including uterus, ovaries, adnexal regions, and pelvic cul-de-sac. It was necessary to proceed with endovaginal exam following the transabdominal exam to visualize the right ovary. Color and duplex Doppler ultrasound was utilized to evaluate blood flow to the ovaries. COMPARISON:  MRI pelvis from 04/28/2022. FINDINGS: Uterus Measurements: 6.8 x 7.5 x 10.6 cm = volume: 283.5 mL. There are at least 2 intramural leiomyomas measuring 1.6 x 1.8 x 2.9 cm in the anterior upper uterine body/fundal region and a 2.3 x 2.3 x 3.2 cm in the posterior uterine body. Endometrium Thickness: 5.5 mm. On the transabdominal images, there is a lobulated approximately 7 x 12 x 13 mm hyperechoic nonshadowing focus in the fundal endometrium. This focus is not seen on the transvaginal images. This may represent small endometrial polyp. Differential diagnosis also includes submucosal leiomyoma, however, favored less likely. Right ovary Measurements: 1.6 x 2.5 x 2.6 cm = volume: 5.3 mL. Normal appearance/no adnexal mass. Left ovary Measurements: 1.7 x 2.8  x 3.4 cm = volume: 8.8 mL. Normal appearance/no adnexal mass. Pulsed Doppler evaluation of both ovaries demonstrates normal low-resistance arterial and venous waveforms. While demonstrable color flow and spectral Doppler in an ovary cannot completely exclude ovarian torsion, in combination with a normal grayscale appearance of the ovary, this makes torsion highly unlikely. Other findings No abnormal free fluid. IMPRESSION: 1. Leiomyomatous uterus. 2. There is a 7 x 12 x 13 mm hyperechoic nonshadowing focus in the fundal endometrium, which may represent a small endometrial polyp. 3. Unremarkable bilateral ovaries. No sonographic evidence of ovarian torsion. Electronically Signed   By: Beula Brunswick M.D.   On: 07/17/2023 08:54   US  Transvaginal Non-OB Result Date: 07/17/2023 CLINICAL DATA:  R sided pelvic pain. EXAM: TRANSABDOMINAL AND TRANSVAGINAL ULTRASOUND OF PELVIS DOPPLER ULTRASOUND OF OVARIES TECHNIQUE: Both transabdominal and transvaginal ultrasound examinations of the pelvis were  performed. Transabdominal technique was performed for global imaging of the pelvis including uterus, ovaries, adnexal regions, and pelvic cul-de-sac. It was necessary to proceed with endovaginal exam following the transabdominal exam to visualize the right ovary. Color and duplex Doppler ultrasound was utilized to evaluate blood flow to the ovaries. COMPARISON:  MRI pelvis from 04/28/2022. FINDINGS: Uterus Measurements: 6.8 x 7.5 x 10.6 cm = volume: 283.5 mL. There are at least 2 intramural leiomyomas measuring 1.6 x 1.8 x 2.9 cm in the anterior upper uterine body/fundal region and a 2.3 x 2.3 x 3.2 cm in the posterior uterine body. Endometrium Thickness: 5.5 mm. On the transabdominal images, there is a lobulated approximately 7 x 12 x 13 mm hyperechoic nonshadowing focus in the fundal endometrium. This focus is not seen on the transvaginal images. This may represent small endometrial polyp. Differential diagnosis also includes  submucosal leiomyoma, however, favored less likely. Right ovary Measurements: 1.6 x 2.5 x 2.6 cm = volume: 5.3 mL. Normal appearance/no adnexal mass. Left ovary Measurements: 1.7 x 2.8 x 3.4 cm = volume: 8.8 mL. Normal appearance/no adnexal mass. Pulsed Doppler evaluation of both ovaries demonstrates normal low-resistance arterial and venous waveforms. While demonstrable color flow and spectral Doppler in an ovary cannot completely exclude ovarian torsion, in combination with a normal grayscale appearance of the ovary, this makes torsion highly unlikely. Other findings No abnormal free fluid. IMPRESSION: 1. Leiomyomatous uterus. 2. There is a 7 x 12 x 13 mm hyperechoic nonshadowing focus in the fundal endometrium, which may represent a small endometrial polyp. 3. Unremarkable bilateral ovaries. No sonographic evidence of ovarian torsion. Electronically Signed   By: Beula Brunswick M.D.   On: 07/17/2023 08:54   US  Art/Ven Flow Abd Pelv Doppler Result Date: 07/17/2023 CLINICAL DATA:  R sided pelvic pain. EXAM: TRANSABDOMINAL AND TRANSVAGINAL ULTRASOUND OF PELVIS DOPPLER ULTRASOUND OF OVARIES TECHNIQUE: Both transabdominal and transvaginal ultrasound examinations of the pelvis were performed. Transabdominal technique was performed for global imaging of the pelvis including uterus, ovaries, adnexal regions, and pelvic cul-de-sac. It was necessary to proceed with endovaginal exam following the transabdominal exam to visualize the right ovary. Color and duplex Doppler ultrasound was utilized to evaluate blood flow to the ovaries. COMPARISON:  MRI pelvis from 04/28/2022. FINDINGS: Uterus Measurements: 6.8 x 7.5 x 10.6 cm = volume: 283.5 mL. There are at least 2 intramural leiomyomas measuring 1.6 x 1.8 x 2.9 cm in the anterior upper uterine body/fundal region and a 2.3 x 2.3 x 3.2 cm in the posterior uterine body. Endometrium Thickness: 5.5 mm. On the transabdominal images, there is a lobulated approximately 7 x 12 x 13  mm hyperechoic nonshadowing focus in the fundal endometrium. This focus is not seen on the transvaginal images. This may represent small endometrial polyp. Differential diagnosis also includes submucosal leiomyoma, however, favored less likely. Right ovary Measurements: 1.6 x 2.5 x 2.6 cm = volume: 5.3 mL. Normal appearance/no adnexal mass. Left ovary Measurements: 1.7 x 2.8 x 3.4 cm = volume: 8.8 mL. Normal appearance/no adnexal mass. Pulsed Doppler evaluation of both ovaries demonstrates normal low-resistance arterial and venous waveforms. While demonstrable color flow and spectral Doppler in an ovary cannot completely exclude ovarian torsion, in combination with a normal grayscale appearance of the ovary, this makes torsion highly unlikely. Other findings No abnormal free fluid. IMPRESSION: 1. Leiomyomatous uterus. 2. There is a 7 x 12 x 13 mm hyperechoic nonshadowing focus in the fundal endometrium, which may represent a small endometrial polyp. 3. Unremarkable bilateral ovaries.  No sonographic evidence of ovarian torsion. Electronically Signed   By: Beula Brunswick M.D.   On: 07/17/2023 08:54    Procedures Procedures    Medications Ordered in ED Medications - No data to display  ED Course/ Medical Decision Making/ A&P                                 Medical Decision Making Amount and/or Complexity of Data Reviewed Labs: ordered. Radiology: ordered.  Risk OTC drugs. Prescription drug management.   This patient presents to the ED for concern of right pelvic pain differential diagnosis includes pancreatitis, appendicitis, choledocholithiasis, ovarian torsion, ovarian cyst, endometriosis, menstrual cramping   Lab Tests:  I Ordered, and personally interpreted labs.  The pertinent results include: No leukocytosis, transaminitis, or elevated lipase.  Pregnancy test negative, UA with moderate hemoglobin and rare bacteria.   Imaging Studies ordered:  I ordered imaging studies including  ultrasound ovarian torsion rule out I independently visualized and interpreted imaging which showed leiomyomatous uterus.  Possible small endometrial polyp.  Unremarkable bilateral ovaries.  No sonographic evidence of ovarian torsion. I agree with the radiologist interpretation  Problem List / ED Course:  Considered for admission or further workup however patient's vital signs, physical exam, labs, and imaging of been reassuring.  No emergent condition has been identified at this time.  Patient encouraged to follow-up with primary care or OB/GYN if her symptoms persist for further evaluation workup.  Patient advised to take Tylenol  and Motrin  as needed for pain and Zofran  for nausea/vomiting.  I feel patient is safe for discharge at this time.   Final Clinical Impression(s) / ED Diagnoses Final diagnoses:  Painful menstrual periods    Rx / DC Orders ED Discharge Orders     None         Carie Charity, PA-C 07/17/23 0902    Carie Charity, PA-C 07/17/23 [id number]    Iva Mariner, MD 07/17/23 1651

## 2023-07-17 NOTE — ED Triage Notes (Signed)
 Pt c/o constant RLQ pain that started last night, states that the pain is same as when she had her menstrual cycle last month.

## 2023-07-17 NOTE — ED Triage Notes (Signed)
 Pt from [facility]- left due to wait. Blood work and urine has been collected. States she is having pain RLQ pelvic pain. OTC tylenol  and heat pad not helping. Same pain last month on menstrual cycle. Is suspicious she may have endometriosis.

## 2023-08-29 NOTE — Progress Notes (Deleted)
 HPI: Follow-up hypertension and palpitations. Patient delivered by C-section on 11/25/2015. She was found to have a pulmonary embolus in October 2017 and there was note of DVT on lower extremity venous Dopplers. She was treated with xarelto . Stress echocardiogram January 2019 normal. Monitor May 2021 showed transient Mobitz 1 second-degree AV block in the early morning hours.  Cardiac CTA September 2021 showed no coronary disease and calcium score 0.  CTA February 2022 showed no pulmonary embolus.  Echocardiogram January 2023 showed normal LV function, mild right ventricular enlargement, mild left atrial enlargement.  Since last seen,    Current Outpatient Medications  Medication Sig Dispense Refill   acetaminophen  (TYLENOL ) 500 MG tablet Take 2 tablets (1,000 mg total) by mouth every 8 (eight) hours as needed for moderate pain (pain score 4-6). 30 tablet 0   albuterol  (VENTOLIN  HFA) 108 (90 Base) MCG/ACT inhaler Inhale 1-2 puffs into the lungs every 6 (six) hours as needed for wheezing or shortness of breath. 18 g 0   amLODipine  (NORVASC ) 10 MG tablet TAKE 1 TABLET BY MOUTH DAILY (Patient taking differently: Take 10 mg by mouth daily.) 90 tablet 3   Cholecalciferol (VITAMIN D3) 1.25 MG (50000 UT) CAPS Take 1 capsule by mouth once a week.     ferrous sulfate  325 (65 FE) MG tablet Take 1 tablet (325 mg total) by mouth every other day. 30 tablet 1   hydrochlorothiazide  (HYDRODIURIL ) 12.5 MG tablet Take 12.5 mg by mouth daily.     ibuprofen  (ADVIL ) 600 MG tablet Take 1 tablet (600 mg total) by mouth every 6 (six) hours as needed for moderate pain. (Patient not taking: Reported on 12/11/2022) 30 tablet 1   ibuprofen  (ADVIL ) 800 MG tablet Take 1 tablet (800 mg total) by mouth 3 (three) times daily. 21 tablet 0   Multiple Vitamins-Minerals (MULTIVITAL PO) Take 1 tablet by mouth daily.     omeprazole  (PRILOSEC) 20 MG capsule Take 1 capsule (20 mg total) by mouth daily. 30 capsule 0   ondansetron   (ZOFRAN -ODT) 4 MG disintegrating tablet Take 1 tablet (4 mg total) by mouth every 8 (eight) hours as needed for nausea or vomiting. 20 tablet 0   oxyCODONE  (ROXICODONE ) 5 MG immediate release tablet Take 1 tablet (5 mg total) by mouth every 6 (six) hours as needed for severe pain. (Patient not taking: Reported on 12/11/2022) 10 tablet 0   Semaglutide, 1 MG/DOSE, (OZEMPIC, 1 MG/DOSE,) 4 MG/3ML SOPN Inject 1 mg into the skin once a week. (Patient not taking: Reported on 12/11/2022)     triamcinolone ointment (KENALOG) 0.1 % Apply 1 application  topically daily as needed (break outs).     No current facility-administered medications for this visit.     Past Medical History:  Diagnosis Date   Anemia    DVT (deep venous thrombosis) (HCC)    a. 11/2015 U/S: Left Peroneal DVT;  b. 02/2016 U/S: No DVT bilat.   Essential hypertension    a. 01/2016 Echo: EF 60-65%, no rwma, triv MR/TR, PASP .   Fibroid    GERD (gastroesophageal reflux disease)    Pulmonary emboli (HCC)    a. 11/2015 CTA Chest: PE involving the segmental and subsegmental branches of the RML & RLL-->Xarelto .    Past Surgical History:  Procedure Laterality Date   CESAREAN SECTION N/A 04/04/2013   Procedure: CESAREAN SECTION;  Surgeon: Harland JAYSON Birkenhead, MD;  Location: [facility];  Service: Obstetrics;  Laterality: N/A;   CESAREAN SECTION N/A 11/25/2015  Procedure: CESAREAN SECTION;  Surgeon: Jon Rummer, MD;  Location: [facility];  Service: Obstetrics;  Laterality: N/A;   DILITATION & CURRETTAGE/HYSTROSCOPY WITH NOVASURE ABLATION N/A 11/22/2021   Procedure: DILATATION & CURETTAGE/HYSTEROSCOPY WITH HYDROTHERMAL  ABLATION;  Surgeon: Rummer Jon, MD;  Location: [facility];  Service: Gynecology;  Laterality: N/A;   LAPAROSCOPIC CHOLECYSTECTOMY  2010   [place], [place]    TONSILLECTOMY      Social History   Socioeconomic History   Marital status: Single    Spouse name: Not on file   Number of  children: Not on file   Years of education: Not on file   Highest education level: Not on file  Occupational History   Not on file  Tobacco Use   Smoking status: Never   Smokeless tobacco: Never  Vaping Use   Vaping status: Never Used  Substance and Sexual Activity   Alcohol use: Yes    Comment: occassionally   Drug use: No   Sexual activity: Not Currently    Birth control/protection: None  Other Topics Concern   Not on file  Social History Narrative   Lives in [place].  Relocated to [place] a few years ago.   Social Drivers of Corporate investment banker Strain: Not on file  Food Insecurity: Low Risk  (05/08/2023)   Received from [facility]   Hunger Vital Sign    Within the past 12 months, you worried that your food would run out before you got money to buy more: Never true    Within the past 12 months, the food you bought just didn't last and you didn't have money to get more. : Never true  Transportation Needs: No Transportation Needs (05/08/2023)   Received from [facility]    In the past 12 months, has lack of reliable transportation kept you from medical appointments, meetings, work or from getting things needed for daily living? : No  Physical Activity: Not on file  Stress: Not on file  Social Connections: Not on file  Intimate Partner Violence: Not on file    Family History  Problem Relation Age of Onset   Diabetes Mother    Hypertension Mother    Kidney disease Mother    Heart disease Mother    Hypertension Father     ROS: no fevers or chills, productive cough, hemoptysis, dysphasia, odynophagia, melena, hematochezia, dysuria, hematuria, rash, seizure activity, orthopnea, PND, pedal edema, claudication. Remaining systems are negative.  Physical Exam: Well-developed well-nourished in no acute distress.  Skin is warm and dry.  HEENT is normal.  Neck is supple.  Chest is clear to auscultation with normal expansion.   Cardiovascular exam is regular rate and rhythm.  Abdominal exam nontender or distended. No masses palpated. Extremities show no edema. neuro grossly intact  ECG- personally reviewed  A/P  1 palpitations-no significant arrhythmias noted on previous monitor.  2 chest pain-previous CTA showed no coronary artery disease.  Will not pursue further evaluation.  3 hypertension-patient's blood pressure is controlled.  Continue present medical regimen.  Redell Shallow, MD

## 2023-09-06 ENCOUNTER — Ambulatory Visit: Attending: Cardiovascular Disease | Admitting: Cardiology

## 2023-09-12 ENCOUNTER — Encounter: Payer: Self-pay | Admitting: Cardiology

## 2023-11-20 NOTE — Progress Notes (Signed)
 HPI: Follow-up hypertension and palpitations. Patient delivered by C-section on 11/25/2015. She was found to have a pulmonary embolus in October 2017 and there was note of DVT on lower extremity venous Dopplers. She was treated with xarelto . Stress echocardiogram January 2019 normal. Monitor May 2021 showed transient Mobitz 1 second-degree AV block in the early morning hours.  Cardiac CTA September 2021 showed no coronary disease and calcium score 0.  CTA February 2022 showed no pulmonary embolus.  Echocardiogram March 2023 showed normal LV function, mild right ventricular enlargement and mild left atrial enlargement.  Since last seen, she denies dyspnea on exertion, exertional chest pain, palpitations or syncope.  Current Outpatient Medications  Medication Sig Dispense Refill   acetaminophen  (TYLENOL ) 500 MG tablet Take 2 tablets (1,000 mg total) by mouth every 8 (eight) hours as needed for moderate pain (pain score 4-6). 30 tablet 0   albuterol  (VENTOLIN  HFA) 108 (90 Base) MCG/ACT inhaler Inhale 1-2 puffs into the lungs every 6 (six) hours as needed for wheezing or shortness of breath. 18 g 0   amLODipine  (NORVASC ) 10 MG tablet TAKE 1 TABLET BY MOUTH DAILY (Patient taking differently: Take 10 mg by mouth daily.) 90 tablet 3   Cholecalciferol (VITAMIN D3) 1.25 MG (50000 UT) CAPS Take 1 capsule by mouth once a week.     ferrous sulfate  325 (65 FE) MG tablet Take 1 tablet (325 mg total) by mouth every other day. 30 tablet 1   hydrochlorothiazide  (HYDRODIURIL ) 12.5 MG tablet Take 12.5 mg by mouth daily.     ibuprofen  (ADVIL ) 600 MG tablet Take 1 tablet (600 mg total) by mouth every 6 (six) hours as needed for moderate pain. 30 tablet 1   ibuprofen  (ADVIL ) 800 MG tablet Take 1 tablet (800 mg total) by mouth 3 (three) times daily. 21 tablet 0   Multiple Vitamins-Minerals (MULTIVITAL PO) Take 1 tablet by mouth daily.     omeprazole  (PRILOSEC) 20 MG capsule Take 1 capsule (20 mg total) by mouth daily.  30 capsule 0   ondansetron  (ZOFRAN -ODT) 4 MG disintegrating tablet Take 1 tablet (4 mg total) by mouth every 8 (eight) hours as needed for nausea or vomiting. 20 tablet 0   oxyCODONE  (ROXICODONE ) 5 MG immediate release tablet Take 1 tablet (5 mg total) by mouth every 6 (six) hours as needed for severe pain. 10 tablet 0   Semaglutide, 1 MG/DOSE, (OZEMPIC, 1 MG/DOSE,) 4 MG/3ML SOPN Inject 1 mg into the skin once a week.     triamcinolone ointment (KENALOG) 0.1 % Apply 1 application  topically daily as needed (break outs).     WEGOVY 1.7 MG/0.75ML SOAJ SQ injection Inject 1.7 mg into the skin once a week.     No current facility-administered medications for this visit.     Past Medical History:  Diagnosis Date   Anemia    DVT (deep venous thrombosis) (HCC)    a. 11/2015 U/S: Left Peroneal DVT;  b. 02/2016 U/S: No DVT bilat.   Essential hypertension    a. 01/2016 Echo: EF 60-65%, no rwma, triv MR/TR, PASP .   Fibroid    GERD (gastroesophageal reflux disease)    Pulmonary emboli (HCC)    a. 11/2015 CTA Chest: PE involving the segmental and subsegmental branches of the RML & RLL-->Xarelto .    Past Surgical History:  Procedure Laterality Date   CESAREAN SECTION N/A 04/04/2013   Procedure: CESAREAN SECTION;  Surgeon: Harland JAYSON Birkenhead, MD;  Location: [facility];  Service:  Obstetrics;  Laterality: N/A;   CESAREAN SECTION N/A 11/25/2015   Procedure: CESAREAN SECTION;  Surgeon: Jon Rummer, MD;  Location: [facility];  Service: Obstetrics;  Laterality: N/A;   DILITATION & CURRETTAGE/HYSTROSCOPY WITH NOVASURE ABLATION N/A 11/22/2021   Procedure: DILATATION & CURETTAGE/HYSTEROSCOPY WITH HYDROTHERMAL  ABLATION;  Surgeon: Rummer Jon, MD;  Location: [facility];  Service: Gynecology;  Laterality: N/A;   LAPAROSCOPIC CHOLECYSTECTOMY  2010   [place], [place]    TONSILLECTOMY      Social History   Socioeconomic History   Marital status: Single    Spouse name: Not  on file   Number of children: Not on file   Years of education: Not on file   Highest education level: Not on file  Occupational History   Not on file  Tobacco Use   Smoking status: Never   Smokeless tobacco: Never  Vaping Use   Vaping status: Never Used  Substance and Sexual Activity   Alcohol use: Yes    Comment: occassionally   Drug use: No   Sexual activity: Not Currently    Birth control/protection: None  Other Topics Concern   Not on file  Social History Narrative   Lives in [place].  Relocated to [place] a few years ago.   Social Drivers of Corporate investment banker Strain: Not on file  Food Insecurity: Low Risk  (05/08/2023)   Received from [facility]   Hunger Vital Sign    Within the past 12 months, you worried that your food would run out before you got money to buy more: Never true    Within the past 12 months, the food you bought just didn't last and you didn't have money to get more. : Never true  Transportation Needs: No Transportation Needs (05/08/2023)   Received from [facility]    In the past 12 months, has lack of reliable transportation kept you from medical appointments, meetings, work or from getting things needed for daily living? : No  Physical Activity: Not on file  Stress: Not on file  Social Connections: Not on file  Intimate Partner Violence: Not on file    Family History  Problem Relation Age of Onset   Diabetes Mother    Hypertension Mother    Kidney disease Mother    Heart disease Mother    Hypertension Father     ROS: no fevers or chills, productive cough, hemoptysis, dysphasia, odynophagia, melena, hematochezia, dysuria, hematuria, rash, seizure activity, orthopnea, PND, pedal edema, claudication. Remaining systems are negative.  Physical Exam: Well-developed well-nourished in no acute distress.  Skin is warm and dry.  HEENT is normal.  Neck is supple.  Chest is clear to auscultation with normal  expansion.  Cardiovascular exam is regular rate and rhythm.  Abdominal exam nontender or distended. No masses palpated. Extremities show no edema. neuro grossly intact  EKG Interpretation Date/Time:  Friday December 01 2023 09:48:52 EDT Ventricular Rate:  71 PR Interval:  240 QRS Duration:  94 QT Interval:  382 QTC Calculation: 415 R Axis:   18  Text Interpretation: Sinus rhythm with 1st degree A-V block Confirmed by Pietro Rogue ([id number]) on 12/01/2023 9:49:31 AM    A/P  1 history of chest pain-previous CTA showed no coronary disease.  2 hypertension-patient's blood pressure is mildly elevated; however she recently misplaced her medications and only resumed taking them yesterday.  She will follow her blood pressure at home.  Goal systolic blood pressure less  than 130 and diastolic less than 85.  We will adjust based on follow-up readings.  3 history of palpitations-previous monitor showed no significant arrhythmia.  Redell Shallow, MD

## 2023-12-01 ENCOUNTER — Encounter: Payer: Self-pay | Admitting: Cardiology

## 2023-12-01 ENCOUNTER — Ambulatory Visit: Attending: Cardiology | Admitting: Cardiology

## 2023-12-01 VITALS — BP 141/79 | HR 70 | Ht 64.0 in | Wt 203.0 lb

## 2023-12-01 DIAGNOSIS — O10019 Pre-existing essential hypertension complicating pregnancy, unspecified trimester: Secondary | ICD-10-CM | POA: Insufficient documentation

## 2023-12-01 DIAGNOSIS — R002 Palpitations: Secondary | ICD-10-CM | POA: Diagnosis present

## 2023-12-01 DIAGNOSIS — I10 Essential (primary) hypertension: Secondary | ICD-10-CM | POA: Diagnosis not present

## 2023-12-01 NOTE — Patient Instructions (Signed)
